# Patient Record
Sex: Female | Born: 1950
Health system: Southern US, Community
[De-identification: ages and names within clinical notes are randomized; demographics above are authoritative.]

## PROBLEM LIST (undated history)

## (undated) DIAGNOSIS — R251 Tremor, unspecified: Secondary | ICD-10-CM

## (undated) DIAGNOSIS — R519 Headache, unspecified: Secondary | ICD-10-CM

## (undated) DIAGNOSIS — R569 Unspecified convulsions: Secondary | ICD-10-CM

## (undated) DIAGNOSIS — R51 Headache: Secondary | ICD-10-CM

## (undated) DIAGNOSIS — K219 Gastro-esophageal reflux disease without esophagitis: Secondary | ICD-10-CM

## (undated) DIAGNOSIS — E079 Disorder of thyroid, unspecified: Secondary | ICD-10-CM

## (undated) DIAGNOSIS — F209 Schizophrenia, unspecified: Secondary | ICD-10-CM

## (undated) DIAGNOSIS — K439 Ventral hernia without obstruction or gangrene: Secondary | ICD-10-CM

## (undated) DIAGNOSIS — R269 Unspecified abnormalities of gait and mobility: Secondary | ICD-10-CM

## (undated) DIAGNOSIS — E119 Type 2 diabetes mellitus without complications: Secondary | ICD-10-CM

## (undated) DIAGNOSIS — F79 Unspecified intellectual disabilities: Secondary | ICD-10-CM

## (undated) DIAGNOSIS — Z8719 Personal history of other diseases of the digestive system: Secondary | ICD-10-CM

## (undated) DIAGNOSIS — Z8639 Personal history of other endocrine, nutritional and metabolic disease: Secondary | ICD-10-CM

## (undated) DIAGNOSIS — I10 Essential (primary) hypertension: Secondary | ICD-10-CM

## (undated) DIAGNOSIS — R600 Localized edema: Secondary | ICD-10-CM

## (undated) DIAGNOSIS — E039 Hypothyroidism, unspecified: Secondary | ICD-10-CM

## (undated) HISTORY — DX: Unspecified abnormalities of gait and mobility: R26.9

## (undated) HISTORY — DX: Personal history of other diseases of the digestive system: Z87.19

## (undated) HISTORY — DX: Ventral hernia without obstruction or gangrene: K43.9

## (undated) HISTORY — PX: STOMACH SURGERY: SHX791

## (undated) HISTORY — PX: OTHER SURGICAL HISTORY: SHX169

## (undated) HISTORY — DX: Tremor, unspecified: R25.1

## (undated) HISTORY — DX: Headache: R51

## (undated) HISTORY — DX: Headache, unspecified: R51.9

## (undated) HISTORY — DX: Type 2 diabetes mellitus without complications: E11.9

## (undated) HISTORY — DX: Unspecified convulsions: R56.9

## (undated) HISTORY — PX: CATARACT EXTRACTION, BILATERAL: SHX1313

## (undated) HISTORY — DX: Localized edema: R60.0

---
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
## 1999-08-03 DIAGNOSIS — G40401 Other generalized epilepsy and epileptic syndromes, not intractable, with status epilepticus: Secondary | ICD-10-CM | POA: Insufficient documentation

## 2006-05-22 DIAGNOSIS — R059 Cough, unspecified: Secondary | ICD-10-CM | POA: Insufficient documentation

## 2007-08-18 ENCOUNTER — Encounter: Admission: RE | Admit: 2007-08-18 | Discharge: 2007-08-18 | Payer: Self-pay | Admitting: Family Medicine

## 2008-08-23 ENCOUNTER — Encounter: Admission: RE | Admit: 2008-08-23 | Discharge: 2008-08-23 | Payer: Self-pay | Admitting: Family Medicine

## 2009-08-24 ENCOUNTER — Encounter: Admission: RE | Admit: 2009-08-24 | Discharge: 2009-08-24 | Payer: Self-pay | Admitting: Family Medicine

## 2010-04-29 ENCOUNTER — Encounter: Payer: Self-pay | Admitting: Family Medicine

## 2010-08-14 ENCOUNTER — Other Ambulatory Visit: Payer: Self-pay | Admitting: Family Medicine

## 2010-08-14 DIAGNOSIS — Z1231 Encounter for screening mammogram for malignant neoplasm of breast: Secondary | ICD-10-CM

## 2010-08-29 ENCOUNTER — Ambulatory Visit
Admission: RE | Admit: 2010-08-29 | Discharge: 2010-08-29 | Disposition: A | Discharge status: Home / Self Care | Payer: Medicare Other | Source: Ambulatory Visit | Attending: Family Medicine | Admitting: Family Medicine

## 2010-08-29 DIAGNOSIS — Z1231 Encounter for screening mammogram for malignant neoplasm of breast: Secondary | ICD-10-CM

## 2010-09-15 DIAGNOSIS — K439 Ventral hernia without obstruction or gangrene: Secondary | ICD-10-CM | POA: Insufficient documentation

## 2011-09-02 ENCOUNTER — Other Ambulatory Visit: Payer: Self-pay | Admitting: Family Medicine

## 2011-09-02 DIAGNOSIS — Z1231 Encounter for screening mammogram for malignant neoplasm of breast: Secondary | ICD-10-CM

## 2011-09-22 ENCOUNTER — Ambulatory Visit: Payer: Medicare Other | Admitting: Physical Therapy

## 2011-09-23 ENCOUNTER — Ambulatory Visit
Admission: RE | Admit: 2011-09-23 | Discharge: 2011-09-23 | Disposition: A | Discharge status: Home / Self Care | Payer: Medicare HMO | Source: Ambulatory Visit | Attending: Family Medicine | Admitting: Family Medicine

## 2011-09-23 DIAGNOSIS — Z1231 Encounter for screening mammogram for malignant neoplasm of breast: Secondary | ICD-10-CM

## 2011-12-23 ENCOUNTER — Ambulatory Visit (HOSPITAL_COMMUNITY)
Admission: RE | Admit: 2011-12-23 | Discharge: 2011-12-23 | Disposition: A | Discharge status: Home / Self Care | Payer: Medicare HMO | Source: Ambulatory Visit | Attending: Adult Health Nurse Practitioner | Admitting: Adult Health Nurse Practitioner

## 2011-12-23 ENCOUNTER — Other Ambulatory Visit (HOSPITAL_COMMUNITY): Payer: Self-pay | Admitting: Adult Health Nurse Practitioner

## 2011-12-23 DIAGNOSIS — M25569 Pain in unspecified knee: Secondary | ICD-10-CM | POA: Insufficient documentation

## 2011-12-23 DIAGNOSIS — M25562 Pain in left knee: Secondary | ICD-10-CM

## 2011-12-23 DIAGNOSIS — M899 Disorder of bone, unspecified: Secondary | ICD-10-CM | POA: Insufficient documentation

## 2012-08-29 ENCOUNTER — Emergency Department (HOSPITAL_COMMUNITY)
Admission: EM | Admit: 2012-08-29 | Discharge: 2012-08-29 | Disposition: A | Discharge status: Home / Self Care | Payer: Medicare HMO | Attending: Emergency Medicine | Admitting: Emergency Medicine

## 2012-08-29 ENCOUNTER — Encounter (HOSPITAL_COMMUNITY): Payer: Self-pay | Admitting: Emergency Medicine

## 2012-08-29 ENCOUNTER — Emergency Department (HOSPITAL_COMMUNITY): Payer: Medicare HMO

## 2012-08-29 DIAGNOSIS — W19XXXA Unspecified fall, initial encounter: Secondary | ICD-10-CM

## 2012-08-29 DIAGNOSIS — I10 Essential (primary) hypertension: Secondary | ICD-10-CM | POA: Insufficient documentation

## 2012-08-29 DIAGNOSIS — IMO0002 Reserved for concepts with insufficient information to code with codable children: Secondary | ICD-10-CM | POA: Insufficient documentation

## 2012-08-29 DIAGNOSIS — Y9389 Activity, other specified: Secondary | ICD-10-CM | POA: Insufficient documentation

## 2012-08-29 DIAGNOSIS — E871 Hypo-osmolality and hyponatremia: Secondary | ICD-10-CM | POA: Insufficient documentation

## 2012-08-29 DIAGNOSIS — Z8639 Personal history of other endocrine, nutritional and metabolic disease: Secondary | ICD-10-CM | POA: Insufficient documentation

## 2012-08-29 DIAGNOSIS — Z8719 Personal history of other diseases of the digestive system: Secondary | ICD-10-CM | POA: Insufficient documentation

## 2012-08-29 DIAGNOSIS — E875 Hyperkalemia: Secondary | ICD-10-CM | POA: Insufficient documentation

## 2012-08-29 DIAGNOSIS — E876 Hypokalemia: Secondary | ICD-10-CM

## 2012-08-29 DIAGNOSIS — Z8669 Personal history of other diseases of the nervous system and sense organs: Secondary | ICD-10-CM | POA: Insufficient documentation

## 2012-08-29 DIAGNOSIS — Y92009 Unspecified place in unspecified non-institutional (private) residence as the place of occurrence of the external cause: Secondary | ICD-10-CM | POA: Insufficient documentation

## 2012-08-29 DIAGNOSIS — Z862 Personal history of diseases of the blood and blood-forming organs and certain disorders involving the immune mechanism: Secondary | ICD-10-CM | POA: Insufficient documentation

## 2012-08-29 DIAGNOSIS — R296 Repeated falls: Secondary | ICD-10-CM | POA: Insufficient documentation

## 2012-08-29 HISTORY — DX: Disorder of thyroid, unspecified: E07.9

## 2012-08-29 HISTORY — DX: Essential (primary) hypertension: I10

## 2012-08-29 HISTORY — DX: Gastro-esophageal reflux disease without esophagitis: K21.9

## 2012-08-29 HISTORY — DX: Unspecified convulsions: R56.9

## 2012-08-29 LAB — BASIC METABOLIC PANEL
Creatinine, Ser: 1.06 mg/dL (ref 0.50–1.10)
GFR calc Af Amer: 64 mL/min — ABNORMAL LOW (ref >90)
GFR calc non Af Amer: 55 mL/min — ABNORMAL LOW (ref >90)
Potassium: 3.2 mEq/L — ABNORMAL LOW (ref 3.5–5.1)

## 2012-08-29 LAB — CBC WITH DIFFERENTIAL/PLATELET
Basophils Relative: 1 % (ref 0–1)
Eosinophils Absolute: 0 10*3/uL (ref 0.0–0.7)
HCT: 36.2 % (ref 36.0–46.0)
MCH: 33.7 pg (ref 26.0–34.0)
MCV: 91.6 fL (ref 78.0–100.0)
Monocytes Absolute: 0.7 10*3/uL (ref 0.1–1.0)
Monocytes Relative: 19 % — ABNORMAL HIGH (ref 3–12)
Neutro Abs: 1.7 10*3/uL (ref 1.7–7.7)
Neutrophils Relative %: 51 % (ref 43–77)
RBC: 3.95 MIL/uL (ref 3.87–5.11)
WBC: 3.4 10*3/uL — ABNORMAL LOW (ref 4.0–10.5)

## 2012-08-29 MED ORDER — POTASSIUM CHLORIDE CRYS ER 20 MEQ PO TBCR
40.0000 meq | EXTENDED_RELEASE_TABLET | Freq: Once | ORAL | Status: AC
  Administered 2012-08-29: 40 meq via ORAL
  Filled 2012-08-29: qty 2

## 2012-08-29 MED ORDER — POTASSIUM CHLORIDE CRYS ER 20 MEQ PO TBCR: 20.0000 meq | EXTENDED_RELEASE_TABLET | Freq: Two times a day (BID) | ORAL | Status: DC

## 2012-08-29 NOTE — ED Notes (Signed)
Pt was found out in yard by neighbor due to fall, when asked what caused pt to fall, pt states " I just fell" at times pt will state that her lower legs hurt, next time when asked pt denies any pain, has hx of MR, has mental capacity of 62 year old child. Pt arrived to er fully immobilized, cms intact all extremities, lung sound clear bilateral with equal expansion of chest area, abd soft and non tender, pelvis stable, pt denies any pain with palpation of upper or lower extremities.

## 2012-08-29 NOTE — ED Notes (Signed)
c-collar removed,

## 2012-08-29 NOTE — ED Notes (Signed)
Pt removed from [place] by Dr. Roxanne Mins, c-collar remains in place, pt has abrasion to outer aspect of left knee, no bleeding noted, abrasion cleaned with NS, pt tolerated well.

## 2012-08-29 NOTE — ED Notes (Addendum)
Lab in to draw blood work at present time, family at bedside, updated on plan of care

## 2012-08-29 NOTE — ED Notes (Signed)
Neighbor found pt on ground this am. Pt c/o lower back pain. Pt fully immobilized. cbg 101. Alert/oriented to most. Pt has MR, mentality of 62 year old per what was told to EMS. Slightly anxious.

## 2012-08-29 NOTE — ED Notes (Signed)
EMS reported concerns that pt had not been taking her medication as scheduled, pt has duplicate bottles from April and may with both bottles half full. Pt states that she takes her medications herself.

## 2012-08-29 NOTE — ED Provider Notes (Signed)
History    This chart was scribed for Delora Fuel, MD by Ludger Nutting, ED Scribe. This patient was seen in room APA16A/APA16A and the patient's care was started 9:20 AM.   CSN: [id number]  Arrival date & time 08/29/12  0903   First MD Initiated Contact with Patient 08/29/12 [phone]      Chief Complaint  Patient presents with  . Fall     The history is provided by the patient. The history is limited by the condition of the patient. No language interpreter was used.   Level 5 Caveat HPI Comments: Pam Sanders is a 62 y.o. female brought in by ambulance, who presents to the Emergency Department complaining of a fall that occurred earlier this morning. Pt was found out in the yard by neighbor and when she was asked what caused her to fall, pt states "I just fell". Pt initially reported having back pain but did not mention this Pt lives alone and walks with a cane. EMS was told that pt has MR and mentality of a 62 year old. Pt has h/o HTN, seizures, thyroid disease.   Past Medical History  Diagnosis Date  . Hypertension   . Thyroid disease   . Seizures   . GERD (gastroesophageal reflux disease)     History reviewed. No pertinent past surgical history.  History reviewed. No pertinent family history.  History  Substance Use Topics  . Smoking status: Not on file  . Smokeless tobacco: Not on file  . Alcohol Use: No    OB History   Grav Para Term Preterm Abortions TAB SAB Ect Mult Living                  Review of Systems  Unable to perform ROS: Other  All other systems reviewed and are negative.    Allergies  Review of patient's allergies indicates no known allergies.  Home Medications  No current outpatient prescriptions on file.  BP 124/68  Pulse 69  Temp(Src) 98 F (36.7 C) (Oral)  Resp 19  SpO2 100%  Physical Exam  Nursing note and vitals reviewed. Constitutional: She is oriented to person, place, and time. She appears well-developed and well-nourished.  No distress.  LSB and stiff C collar in place.   HENT:  Head: Normocephalic and atraumatic.  Eyes: EOM are normal.  Neck: Neck supple. No tracheal deviation present.  Cardiovascular: Normal rate, regular rhythm and normal heart sounds.   Pulmonary/Chest: Effort normal and breath sounds normal. No respiratory distress. She has no wheezes. She has no rales. She exhibits no tenderness.  Musculoskeletal: Normal range of motion.  Minor abrasion to lateral aspect of L knee. Inconsistent complaints of pain with palpation and movement of both legs.  Neurological: She is alert and oriented to person, place, and time.  Oriented to person not place or time.   Skin: Skin is warm and dry.  Psychiatric: She has a normal mood and affect. Her behavior is normal.    ED Course  Procedures (including critical care time)  DIAGNOSTIC STUDIES: Oxygen Saturation is 100% on room air, normal by my interpretation.    COORDINATION OF CARE: 9:35 AM LSB was removed by MD.  9:47 AM Discussed treatment plan with pt at bedside and pt agreed to plan.   Results for orders placed during the hospital encounter of 08/29/12  CBC WITH DIFFERENTIAL      Result Value Range   WBC 3.4 (*) 4.0 - 10.5 K/uL   RBC  3.95  3.87 - 5.11 MIL/uL   Hemoglobin 13.3  12.0 - 15.0 g/dL   HCT 36.2  36.0 - 46.0 %   MCV 91.6  78.0 - 100.0 fL   MCH 33.7  26.0 - 34.0 pg   MCHC 36.7 (*) 30.0 - 36.0 g/dL   RDW 12.6  11.5 - 15.5 %   Platelets 154  150 - 400 K/uL   Neutrophils Relative % 51  43 - 77 %   Neutro Abs 1.7  1.7 - 7.7 K/uL   Lymphocytes Relative 29  12 - 46 %   Lymphs Abs 1.0  0.7 - 4.0 K/uL   Monocytes Relative 19 (*) 3 - 12 %   Monocytes Absolute 0.7  0.1 - 1.0 K/uL   Eosinophils Relative 1  0 - 5 %   Eosinophils Absolute 0.0  0.0 - 0.7 K/uL   Basophils Relative 1  0 - 1 %   Basophils Absolute 0.0  0.0 - 0.1 K/uL  BASIC METABOLIC PANEL      Result Value Range   Sodium 129 (*) 135 - 145 mEq/L   Potassium 3.2 (*) 3.5 -  5.1 mEq/L   Chloride 91 (*) 96 - 112 mEq/L   CO2 30  19 - 32 mEq/L   Glucose, Bld 106 (*) 70 - 99 mg/dL   BUN 16  6 - 23 mg/dL   Creatinine, Ser 1.06  0.50 - 1.10 mg/dL   Calcium 9.4  8.4 - 10.5 mg/dL   GFR calc non Af Amer 55 (*) >90 mL/min   GFR calc Af Amer 64 (*) >90 mL/min  VALPROIC ACID LEVEL      Result Value Range   Valproic Acid Lvl 56.1  50.0 - 100.0 ug/mL  CK      Result Value Range   Total CK 163  7 - 177 U/L   Dg Pelvis 1-2 Views  08/29/2012   *RADIOLOGY REPORT*  Clinical Data: Fall  PELVIS - 1-2 VIEW  Comparison: None.  Findings: No acute fracture and no dislocation.  Phleboliths project over the left hemi pelvis.  IMPRESSION: No acute bony injury.   Original Report Authenticated By: Marybelle Killings, M.D.   Ct Head Wo Contrast  08/29/2012   *RADIOLOGY REPORT*  Clinical Data:  Fall  CT HEAD WITHOUT CONTRAST CT CERVICAL SPINE WITHOUT CONTRAST  Technique:  Multidetector CT imaging of the head and cervical spine was performed following the standard protocol without intravenous contrast.  Multiplanar CT image reconstructions of the cervical spine were also generated.  Comparison:   None  CT HEAD  Findings: There is marked encephalomalacia in the left occipital lobe with ex vacuo dilatation of the occipital horn of the left lateral ventricle.  Dural calcifications in this vicinity are noted.  There is shift of the tentorium to the left which has a chronic appearance.  The findings are not significantly changed compared with a prior sinus CT dated 11/03/2007.  Minimal chronic ischemic changes in the periventricular white matter.  Global atrophy.  No mass effect or acute intracranial hemorrhage.  Mastoid air cells are clear.  Visualized paranasal sinuses are clear.  Cranium is intact.  IMPRESSION: No acute intracranial pathology.  Chronic changes are noted.  CT CERVICAL SPINE  Findings: No acute fracture.  No dislocation.  Anatomic alignment. Degenerative changes are noted.  Severe narrowing of  the C5-6 and C6-7 disc with posterior osteophytic ridging.  Right-sided facet arthropathy at C4-5 and C7- T1 on the right.  No evidence of spinal hemorrhage.  Thyroid is atrophic.  No obvious soft tissue injury.  IMPRESSION: No evidence of injury in the cervical spine.   Original Report Authenticated By: Marybelle Killings, M.D.   Ct Cervical Spine Wo Contrast  08/29/2012   *RADIOLOGY REPORT*  Clinical Data:  Fall  CT HEAD WITHOUT CONTRAST CT CERVICAL SPINE WITHOUT CONTRAST  Technique:  Multidetector CT imaging of the head and cervical spine was performed following the standard protocol without intravenous contrast.  Multiplanar CT image reconstructions of the cervical spine were also generated.  Comparison:   None  CT HEAD  Findings: There is marked encephalomalacia in the left occipital lobe with ex vacuo dilatation of the occipital horn of the left lateral ventricle.  Dural calcifications in this vicinity are noted.  There is shift of the tentorium to the left which has a chronic appearance.  The findings are not significantly changed compared with a prior sinus CT dated 11/03/2007.  Minimal chronic ischemic changes in the periventricular white matter.  Global atrophy.  No mass effect or acute intracranial hemorrhage.  Mastoid air cells are clear.  Visualized paranasal sinuses are clear.  Cranium is intact.  IMPRESSION: No acute intracranial pathology.  Chronic changes are noted.  CT CERVICAL SPINE  Findings: No acute fracture.  No dislocation.  Anatomic alignment. Degenerative changes are noted.  Severe narrowing of the C5-6 and C6-7 disc with posterior osteophytic ridging.  Right-sided facet arthropathy at C4-5 and C7- T1 on the right.  No evidence of spinal hemorrhage.  Thyroid is atrophic.  No obvious soft tissue injury.  IMPRESSION: No evidence of injury in the cervical spine.   Original Report Authenticated By: Marybelle Killings, M.D.      1. Fall at home, initial encounter   2. Hypokalemia   3. Hyponatremia        MDM  Fall with no obvious injury. However, with complaints of eye pain, pelvis x-ray will be obtained. She shows no clinical evidence of significant leg injury. Also, CT will be obtained of head and cervical spine. Then scissors a question of whether she has been taking her valproic acid appropriately, level will be checked.   X-rays and CT scans are unremarkable. Valproic acid level is therapeutic. Incidentally noted is hyponatremia and hypokalemia which are probably not clinically significant. She is given a prescription for K-Dur and is to followup with her PCP.     I personally performed the services described in this documentation, which was scribed in my presence. The recorded information has been reviewed and is accurate.     Delora Fuel, MD [id number]

## 2012-08-29 NOTE — ED Notes (Signed)
Family member with pt states that when pt takes a medicine twice a day she will divide the tablets between two bottles and that is why pt has multiple bottles of same medication,

## 2012-10-12 ENCOUNTER — Other Ambulatory Visit: Payer: Self-pay

## 2012-10-12 DIAGNOSIS — Z1231 Encounter for screening mammogram for malignant neoplasm of breast: Secondary | ICD-10-CM

## 2012-11-08 ENCOUNTER — Ambulatory Visit
Admission: RE | Admit: 2012-11-08 | Discharge: 2012-11-08 | Disposition: A | Discharge status: Home / Self Care | Payer: Medicare HMO | Source: Ambulatory Visit

## 2012-11-08 DIAGNOSIS — Z1231 Encounter for screening mammogram for malignant neoplasm of breast: Secondary | ICD-10-CM

## 2012-11-10 ENCOUNTER — Other Ambulatory Visit: Payer: Self-pay | Admitting: Family Medicine

## 2012-11-10 DIAGNOSIS — R928 Other abnormal and inconclusive findings on diagnostic imaging of breast: Secondary | ICD-10-CM

## 2012-11-24 ENCOUNTER — Ambulatory Visit
Admission: RE | Admit: 2012-11-24 | Discharge: 2012-11-24 | Disposition: A | Discharge status: Home / Self Care | Payer: Medicare HMO | Source: Ambulatory Visit | Attending: Family Medicine | Admitting: Family Medicine

## 2012-11-24 DIAGNOSIS — R928 Other abnormal and inconclusive findings on diagnostic imaging of breast: Secondary | ICD-10-CM

## 2013-01-04 ENCOUNTER — Encounter: Payer: Self-pay | Admitting: Neurology

## 2013-01-04 DIAGNOSIS — R269 Unspecified abnormalities of gait and mobility: Secondary | ICD-10-CM

## 2013-01-04 DIAGNOSIS — E119 Type 2 diabetes mellitus without complications: Secondary | ICD-10-CM | POA: Insufficient documentation

## 2013-01-05 ENCOUNTER — Encounter: Payer: Self-pay | Admitting: Neurology

## 2013-01-05 ENCOUNTER — Ambulatory Visit (INDEPENDENT_AMBULATORY_CARE_PROVIDER_SITE_OTHER): Payer: Medicare HMO | Admitting: Neurology

## 2013-01-05 VITALS — BP 124/75 | HR 60 | Ht 62.0 in | Wt 194.0 lb

## 2013-01-05 DIAGNOSIS — G40909 Epilepsy, unspecified, not intractable, without status epilepticus: Secondary | ICD-10-CM | POA: Insufficient documentation

## 2013-01-05 DIAGNOSIS — R569 Unspecified convulsions: Secondary | ICD-10-CM

## 2013-01-05 MED ORDER — TOPIRAMATE 100 MG PO TABS: 100.0000 mg | ORAL_TABLET | Freq: Two times a day (BID) | ORAL | Status: DC

## 2013-01-05 MED ORDER — DIVALPROEX SODIUM ER 500 MG PO TB24: 500.0000 mg | ORAL_TABLET | Freq: Two times a day (BID) | ORAL | Status: DC

## 2013-01-05 NOTE — Progress Notes (Signed)
History of Present Illness:   Pam Sanders is a 62 years old left-handed Caucasian female, accompanied by her sister at today's clinical visit.   She has past medical history of hard of hearing, mental retardation, epilepsy, came in for evaluation of bilateral hands shaking, continued gait unsteadiness, she is referred by her primary care physician.  She was previously evaluated by my clinic Dr. Estella Husk  2008, MRI of the brain then has demonstrated left parietal occipital area encephalomalacia, and focal dilatation of the posterior horn, and trigone in lateral ventricle, deviation of the midline, laboratory evaluation in November 2011 has demonstrated mild elevated glucose 100, otherwise normal CMP, mild decreased WBC 3.9 otherwise normal CBC, TSH, phenobarbital level 23.7, VPA 61.3  Patient had a history of epilepsy since the 62 years old, was under the care of her parents, but has moved out and had  her own apartment since she was 20th, she graduated from high school, but has mental retardation only was able to work simple job, she is now moving close to her sister who accompanied her at today's clinical visit, still lives alone at at her own apartment, managing her home medications, but she never drive, last seizure was about 2 years ago, in a stressful situation.  She always has mild staggering gait, has been on phenobarbital treatment for an extended period of time, current dosage is 16.2mg , 97.2mg  at nighttime, Topamax 100 b.i.d., VPA XR 500 mg every day. Her sister also noticed 6 months history of left hand tremor, no weakness, no incontinence, no loss central smell, good nighttime sleep  We were able to  taper her off phenobarbital,she is overall doing very well, there was no recurrent seizure,   She is currently taking Topamax 100 mg twice a day, Depakote XR 500 mg every night, her sister visited her every other day. She lives in her own apartment     Review of Systems  Out of a complete 14 system  review, the patient complains of only the following symptoms, and all other reviewed systems are negative.  Constitutional: Weight gain   Cardiovascular: Swelling in legs   Sleep:  excessive  sleepiness   Social History  pt lives at home by herself and has a 12th grade educaton. pt has no childern and does not work. pt denies alcohol, illegal drugs, and tobacco use. pt drinks 1 soda daily. She tried to kill herself by stabbing herself in the stomach Inhaled Tobacco Use: never smoker  Family History  mother and father deceased. mother past at age 2 due to a stroke, father deceased at age 74 due to a stroke. diabetes- father side heart problems- mother side  Past Medical History  diabetes  Surgical History  pt had to have sx because she stabbed herself in the stomach    Physical Exam  Neck: supple no carotid bruits Respiratory: clear to auscultation bilaterally Cardiovascular: regular rate rhythm  Neurologic Exam  Mental Status: pleasant,  quiet,awake, alert, cooperative, hard of hearing, could not provide meaniful history Cranial Nerves: CN II-XII pupils were equal round reactive to light. Extraocular movements were full.  Visual fields were full on confrontational test.  Facial sensation and strength were normal.  Hearing was intact to finger rubbing bilaterally.  Uvula tongue were midline.  Head turning and shoulder shrugging were normal and symmetric.  Tongue protrusion into the cheeks strength were normal.  Motor: Normal tone, bulk, and strength, with exception of slight right hand rigidity. Sensory: Normal to light touch Coordination: Normal  finger-to-nose, heel-to-shin.  There was no dysmetria noticed. Gait and Station: impulsive movement, wide based, mild unsteady. Reflexes: Deep tendon reflexes: hypoactive and symmetric.  Plantar responses are flexor.   Assessment and Plan:   62 years old left-handed Caucasian female, with past medical history of epilepsy, on polypharmacy,  including long-standing history of phenobarbital, now presenting with worsening gait difficulty, left hand tremor. There was no rigidity, bradykinesia to suggested parkinsonian features, She is now off Phenobarbital, overall, doing very well, no recurrent seizure.  1. She is to continue Topamax 100mg  bid, and Depakote xr 500mg  qhs for epilepsy control 2.   RTC in 12 months with Hoyle Sauer

## 2013-03-23 ENCOUNTER — Ambulatory Visit: Payer: Medicare HMO | Admitting: Cardiology

## 2013-04-01 ENCOUNTER — Encounter: Payer: Self-pay | Admitting: Cardiology

## 2013-06-22 ENCOUNTER — Other Ambulatory Visit: Payer: Self-pay | Admitting: Family Medicine

## 2013-06-22 ENCOUNTER — Other Ambulatory Visit: Payer: Self-pay

## 2013-06-22 DIAGNOSIS — R921 Mammographic calcification found on diagnostic imaging of breast: Secondary | ICD-10-CM

## 2013-09-22 DIAGNOSIS — I739 Peripheral vascular disease, unspecified: Secondary | ICD-10-CM | POA: Insufficient documentation

## 2013-11-10 ENCOUNTER — Ambulatory Visit
Admission: RE | Admit: 2013-11-10 | Discharge: 2013-11-10 | Disposition: A | Discharge status: Home / Self Care | Payer: Medicare HMO | Source: Ambulatory Visit | Attending: Family Medicine | Admitting: Family Medicine

## 2013-11-10 ENCOUNTER — Encounter (INDEPENDENT_AMBULATORY_CARE_PROVIDER_SITE_OTHER): Payer: Self-pay

## 2013-11-10 DIAGNOSIS — R921 Mammographic calcification found on diagnostic imaging of breast: Secondary | ICD-10-CM

## 2014-01-05 ENCOUNTER — Ambulatory Visit: Payer: Self-pay | Admitting: Nurse Practitioner

## 2014-02-04 ENCOUNTER — Other Ambulatory Visit: Payer: Self-pay | Admitting: Neurology

## 2014-03-06 DIAGNOSIS — E039 Hypothyroidism, unspecified: Secondary | ICD-10-CM | POA: Insufficient documentation

## 2014-03-06 DIAGNOSIS — R609 Edema, unspecified: Secondary | ICD-10-CM | POA: Insufficient documentation

## 2014-03-06 DIAGNOSIS — E669 Obesity, unspecified: Secondary | ICD-10-CM | POA: Insufficient documentation

## 2014-09-05 DIAGNOSIS — F259 Schizoaffective disorder, unspecified: Secondary | ICD-10-CM | POA: Insufficient documentation

## 2014-09-05 DIAGNOSIS — K219 Gastro-esophageal reflux disease without esophagitis: Secondary | ICD-10-CM | POA: Insufficient documentation

## 2014-10-06 ENCOUNTER — Other Ambulatory Visit: Payer: Self-pay | Admitting: Adult Health Nurse Practitioner

## 2014-10-06 DIAGNOSIS — E2839 Other primary ovarian failure: Secondary | ICD-10-CM

## 2014-10-10 ENCOUNTER — Other Ambulatory Visit: Payer: Medicare HMO

## 2014-10-10 ENCOUNTER — Ambulatory Visit
Admission: RE | Admit: 2014-10-10 | Discharge: 2014-10-10 | Disposition: A | Discharge status: Home / Self Care | Payer: Medicare Other | Source: Ambulatory Visit | Attending: Adult Health Nurse Practitioner | Admitting: Adult Health Nurse Practitioner

## 2014-10-10 DIAGNOSIS — E2839 Other primary ovarian failure: Secondary | ICD-10-CM

## 2014-10-16 ENCOUNTER — Other Ambulatory Visit: Payer: Self-pay | Admitting: Adult Health Nurse Practitioner

## 2014-10-16 DIAGNOSIS — R921 Mammographic calcification found on diagnostic imaging of breast: Secondary | ICD-10-CM

## 2014-10-19 ENCOUNTER — Ambulatory Visit (INDEPENDENT_AMBULATORY_CARE_PROVIDER_SITE_OTHER): Payer: 59 | Admitting: Psychology

## 2014-10-19 DIAGNOSIS — F419 Anxiety disorder, unspecified: Secondary | ICD-10-CM | POA: Diagnosis not present

## 2014-10-19 DIAGNOSIS — F259 Schizoaffective disorder, unspecified: Secondary | ICD-10-CM | POA: Diagnosis not present

## 2014-10-23 ENCOUNTER — Ambulatory Visit: Payer: Medicare HMO | Admitting: Psychology

## 2014-11-13 ENCOUNTER — Ambulatory Visit
Admission: RE | Admit: 2014-11-13 | Discharge: 2014-11-13 | Disposition: A | Discharge status: Home / Self Care | Payer: Medicare Other | Source: Ambulatory Visit | Attending: Adult Health Nurse Practitioner | Admitting: Adult Health Nurse Practitioner

## 2014-11-13 DIAGNOSIS — R921 Mammographic calcification found on diagnostic imaging of breast: Secondary | ICD-10-CM

## 2015-02-08 ENCOUNTER — Ambulatory Visit (INDEPENDENT_AMBULATORY_CARE_PROVIDER_SITE_OTHER): Payer: Medicare Other | Admitting: Otolaryngology

## 2015-02-08 DIAGNOSIS — H903 Sensorineural hearing loss, bilateral: Secondary | ICD-10-CM | POA: Diagnosis not present

## 2015-03-29 DIAGNOSIS — E785 Hyperlipidemia, unspecified: Secondary | ICD-10-CM | POA: Insufficient documentation

## 2015-08-14 ENCOUNTER — Other Ambulatory Visit (HOSPITAL_COMMUNITY): Payer: Self-pay | Admitting: Family Medicine

## 2015-08-14 DIAGNOSIS — R296 Repeated falls: Secondary | ICD-10-CM | POA: Insufficient documentation

## 2015-08-14 DIAGNOSIS — R2681 Unsteadiness on feet: Secondary | ICD-10-CM | POA: Insufficient documentation

## 2015-08-14 DIAGNOSIS — D539 Nutritional anemia, unspecified: Secondary | ICD-10-CM | POA: Insufficient documentation

## 2015-08-14 DIAGNOSIS — G9389 Other specified disorders of brain: Secondary | ICD-10-CM | POA: Insufficient documentation

## 2015-08-14 DIAGNOSIS — N183 Chronic kidney disease, stage 3 unspecified: Secondary | ICD-10-CM | POA: Insufficient documentation

## 2015-08-15 ENCOUNTER — Ambulatory Visit (HOSPITAL_COMMUNITY): Payer: Medicare Other

## 2016-04-05 ENCOUNTER — Inpatient Hospital Stay (HOSPITAL_COMMUNITY)
Admission: EM | Admit: 2016-04-05 | Discharge: 2016-04-07 | DRG: 684 | Disposition: A | Discharge status: Skilled Nursing Facility | Payer: Medicare Other | Attending: Internal Medicine | Admitting: Internal Medicine

## 2016-04-05 ENCOUNTER — Emergency Department (HOSPITAL_COMMUNITY): Payer: Medicare Other

## 2016-04-05 ENCOUNTER — Encounter (HOSPITAL_COMMUNITY): Payer: Self-pay | Admitting: Emergency Medicine

## 2016-04-05 DIAGNOSIS — R269 Unspecified abnormalities of gait and mobility: Secondary | ICD-10-CM | POA: Diagnosis present

## 2016-04-05 DIAGNOSIS — E860 Dehydration: Secondary | ICD-10-CM | POA: Diagnosis not present

## 2016-04-05 DIAGNOSIS — R2681 Unsteadiness on feet: Secondary | ICD-10-CM

## 2016-04-05 DIAGNOSIS — K219 Gastro-esophageal reflux disease without esophagitis: Secondary | ICD-10-CM | POA: Diagnosis present

## 2016-04-05 DIAGNOSIS — E876 Hypokalemia: Secondary | ICD-10-CM | POA: Diagnosis not present

## 2016-04-05 DIAGNOSIS — G40909 Epilepsy, unspecified, not intractable, without status epilepticus: Secondary | ICD-10-CM | POA: Diagnosis present

## 2016-04-05 DIAGNOSIS — Z833 Family history of diabetes mellitus: Secondary | ICD-10-CM

## 2016-04-05 DIAGNOSIS — M6281 Muscle weakness (generalized): Secondary | ICD-10-CM

## 2016-04-05 DIAGNOSIS — W19XXXA Unspecified fall, initial encounter: Secondary | ICD-10-CM | POA: Diagnosis present

## 2016-04-05 DIAGNOSIS — Z823 Family history of stroke: Secondary | ICD-10-CM

## 2016-04-05 DIAGNOSIS — I10 Essential (primary) hypertension: Secondary | ICD-10-CM

## 2016-04-05 DIAGNOSIS — R4189 Other symptoms and signs involving cognitive functions and awareness: Secondary | ICD-10-CM | POA: Diagnosis present

## 2016-04-05 DIAGNOSIS — N179 Acute kidney failure, unspecified: Secondary | ICD-10-CM | POA: Diagnosis not present

## 2016-04-05 DIAGNOSIS — R569 Unspecified convulsions: Secondary | ICD-10-CM

## 2016-04-05 DIAGNOSIS — I951 Orthostatic hypotension: Secondary | ICD-10-CM | POA: Diagnosis present

## 2016-04-05 DIAGNOSIS — E119 Type 2 diabetes mellitus without complications: Secondary | ICD-10-CM | POA: Diagnosis present

## 2016-04-05 DIAGNOSIS — R296 Repeated falls: Secondary | ICD-10-CM | POA: Diagnosis present

## 2016-04-05 DIAGNOSIS — Z79899 Other long term (current) drug therapy: Secondary | ICD-10-CM

## 2016-04-05 DIAGNOSIS — I959 Hypotension, unspecified: Secondary | ICD-10-CM

## 2016-04-05 LAB — COMPREHENSIVE METABOLIC PANEL
ALBUMIN: 3.7 g/dL (ref 3.5–5.0)
ALT: 10 U/L — AB (ref 14–54)
ANION GAP: 10 (ref 5–15)
AST: 21 U/L (ref 15–41)
Alkaline Phosphatase: 41 U/L (ref 38–126)
BUN: 51 mg/dL — ABNORMAL HIGH (ref 6–20)
CHLORIDE: 100 mmol/L — AB (ref 101–111)
CO2: 29 mmol/L (ref 22–32)
CREATININE: 2.58 mg/dL — AB (ref 0.44–1.00)
Calcium: 9 mg/dL (ref 8.9–10.3)
GFR calc non Af Amer: 18 mL/min — ABNORMAL LOW (ref >60)
GFR, EST AFRICAN AMERICAN: 21 mL/min — AB (ref >60)
GLUCOSE: 99 mg/dL (ref 65–99)
Potassium: 4.5 mmol/L (ref 3.5–5.1)
SODIUM: 139 mmol/L (ref 135–145)
Total Bilirubin: 0.8 mg/dL (ref 0.3–1.2)
Total Protein: 7.5 g/dL (ref 6.5–8.1)

## 2016-04-05 LAB — URINALYSIS, ROUTINE W REFLEX MICROSCOPIC
BACTERIA UA: NONE SEEN
Bilirubin Urine: NEGATIVE
Glucose, UA: NEGATIVE mg/dL
KETONES UR: NEGATIVE mg/dL
Leukocytes, UA: NEGATIVE
Nitrite: NEGATIVE
PROTEIN: NEGATIVE mg/dL
Specific Gravity, Urine: 1.01 (ref 1.005–1.030)
pH: 5 (ref 5.0–8.0)

## 2016-04-05 LAB — CBC WITH DIFFERENTIAL/PLATELET
Basophils Absolute: 0 10*3/uL (ref 0.0–0.1)
Basophils Relative: 0 %
Eosinophils Absolute: 0.1 10*3/uL (ref 0.0–0.7)
Eosinophils Relative: 1 %
HEMATOCRIT: 34.7 % — AB (ref 36.0–46.0)
HEMOGLOBIN: 11.8 g/dL — AB (ref 12.0–15.0)
LYMPHS ABS: 1.2 10*3/uL (ref 0.7–4.0)
Lymphocytes Relative: 15 %
MCH: 34.5 pg — AB (ref 26.0–34.0)
MCHC: 34 g/dL (ref 30.0–36.0)
MCV: 101.5 fL — ABNORMAL HIGH (ref 78.0–100.0)
MONO ABS: 0.9 10*3/uL (ref 0.1–1.0)
MONOS PCT: 11 %
NEUTROS ABS: 6 10*3/uL (ref 1.7–7.7)
NEUTROS PCT: 73 %
Platelets: 197 10*3/uL (ref 150–400)
RBC: 3.42 MIL/uL — ABNORMAL LOW (ref 3.87–5.11)
RDW: 12.4 % (ref 11.5–15.5)
WBC: 8.1 10*3/uL (ref 4.0–10.5)

## 2016-04-05 LAB — GLUCOSE, CAPILLARY: GLUCOSE-CAPILLARY: 140 mg/dL — AB (ref 65–99)

## 2016-04-05 LAB — VALPROIC ACID LEVEL: VALPROIC ACID LVL: 38 ug/mL — AB (ref 50.0–100.0)

## 2016-04-05 LAB — TROPONIN I: Troponin I: <0.03 ng/mL (ref <0.03)

## 2016-04-05 MED ORDER — ZOLPIDEM TARTRATE 5 MG PO TABS: 5.0000 mg | ORAL_TABLET | Freq: Every evening | ORAL | Status: DC | PRN

## 2016-04-05 MED ORDER — SODIUM CHLORIDE 0.9 % IV SOLN
INTRAVENOUS | Status: DC
  Administered 2016-04-06 – 2016-04-07 (×3): via INTRAVENOUS

## 2016-04-05 MED ORDER — POTASSIUM CHLORIDE CRYS ER 20 MEQ PO TBCR
10.0000 meq | EXTENDED_RELEASE_TABLET | Freq: Every day | ORAL | Status: DC
  Administered 2016-04-05 – 2016-04-07 (×4): 10 meq via ORAL
  Filled 2016-04-05 (×4): qty 1

## 2016-04-05 MED ORDER — TOPIRAMATE 100 MG PO TABS
100.0000 mg | ORAL_TABLET | Freq: Two times a day (BID) | ORAL | Status: DC
  Administered 2016-04-05 – 2016-04-07 (×4): 100 mg via ORAL
  Filled 2016-04-05 (×4): qty 1

## 2016-04-05 MED ORDER — ENOXAPARIN SODIUM 40 MG/0.4ML ~~LOC~~ SOLN
40.0000 mg | SUBCUTANEOUS | Status: DC
  Administered 2016-04-05 – 2016-04-06 (×2): 40 mg via SUBCUTANEOUS
  Filled 2016-04-05 (×2): qty 0.4

## 2016-04-05 MED ORDER — LEVOTHYROXINE SODIUM 50 MCG PO TABS
200.0000 ug | ORAL_TABLET | Freq: Every day | ORAL | Status: DC
  Administered 2016-04-06 – 2016-04-07 (×2): 200 ug via ORAL
  Filled 2016-04-05 (×2): qty 4

## 2016-04-05 MED ORDER — THIORIDAZINE HCL 25 MG PO TABS
50.0000 mg | ORAL_TABLET | Freq: Every day | ORAL | Status: DC
  Administered 2016-04-05 – 2016-04-06 (×2): 50 mg via ORAL
  Filled 2016-04-05 (×2): qty 2
  Filled 2016-04-05 (×2): qty 1

## 2016-04-05 MED ORDER — POLYETHYLENE GLYCOL 3350 17 G PO PACK: 17.0000 g | PACK | Freq: Every day | ORAL | Status: DC | PRN

## 2016-04-05 MED ORDER — ONDANSETRON HCL 4 MG PO TABS: 4.0000 mg | ORAL_TABLET | Freq: Four times a day (QID) | ORAL | Status: DC | PRN

## 2016-04-05 MED ORDER — PANTOPRAZOLE SODIUM 40 MG PO TBEC
80.0000 mg | DELAYED_RELEASE_TABLET | Freq: Every day | ORAL | Status: DC
  Administered 2016-04-05 – 2016-04-07 (×3): 80 mg via ORAL
  Filled 2016-04-05 (×3): qty 2

## 2016-04-05 MED ORDER — SODIUM CHLORIDE 0.9 % IV BOLUS (SEPSIS)
500.0000 mL | Freq: Once | INTRAVENOUS | Status: DC
Start: 2016-04-05 — End: 2016-04-07

## 2016-04-05 MED ORDER — ONDANSETRON HCL 4 MG/2ML IJ SOLN: 4.0000 mg | Freq: Four times a day (QID) | INTRAMUSCULAR | Status: DC | PRN

## 2016-04-05 MED ORDER — LOSARTAN POTASSIUM 50 MG PO TABS
100.0000 mg | ORAL_TABLET | Freq: Every day | ORAL | Status: DC
  Administered 2016-04-06 – 2016-04-07 (×2): 100 mg via ORAL
  Filled 2016-04-05 (×2): qty 2

## 2016-04-05 MED ORDER — DIVALPROEX SODIUM ER 500 MG PO TB24
500.0000 mg | ORAL_TABLET | Freq: Two times a day (BID) | ORAL | Status: DC
  Administered 2016-04-05 – 2016-04-07 (×4): 500 mg via ORAL
  Filled 2016-04-05 (×4): qty 1

## 2016-04-05 MED ORDER — SODIUM CHLORIDE 0.9% FLUSH
3.0000 mL | Freq: Two times a day (BID) | INTRAVENOUS | Status: DC
  Administered 2016-04-05: 3 mL via INTRAVENOUS

## 2016-04-05 MED ORDER — SODIUM CHLORIDE 0.9 % IV BOLUS (SEPSIS)
500.0000 mL | Freq: Once | INTRAVENOUS | Status: AC
  Administered 2016-04-05: 500 mL via INTRAVENOUS

## 2016-04-05 MED ORDER — SODIUM CHLORIDE 0.9 % IV SOLN
Freq: Once | INTRAVENOUS | Status: AC
  Administered 2016-04-05: 1000 mL via INTRAVENOUS

## 2016-04-05 NOTE — ED Notes (Signed)
Patient fell today while walking outside. Per sister patient found on her back. Patient unsure of hitting her head. Denies any pain or dizziness. Patient has MR and is not a good historian. Family states patient has fallen a couple times recently but think it's related to her rolling seat walker. Patient alert and oriented. No contusions or swelling noted at this time. No pain noted with palpation. Patient noted to be hypotensive by EMS. EDP aware.

## 2016-04-05 NOTE — ED Triage Notes (Signed)
Pt brought to ED by [facility]. Pt fell at home, found lying on back outside, alert. Pt alert and oriented x 4 upon arrival to ED. EMS reports pt hypotensive on scene 70s/50s, given 300cc fluid bolus in route to ED. Pt denies pain. nad noted.

## 2016-04-05 NOTE — ED Provider Notes (Signed)
Emergency Department Provider Note   I have reviewed the triage vital signs and the nursing notes.   HISTORY  Chief Complaint Fall   HPI Pam Sanders is a 65 y.o. female with PMH of DM, GERD, HTN, thyroid disease, and seizure disorder presents to the emergency department by EMS after fall. The patient does not recall the exact mechanism of the fall. She was found face up in her front yard. Patient states that she "just fell" and is not sure about head trauma. Denies any CP, SOB, or palpitations immediately prior to falling. Patient denies any other recent fall. Denies fever, chills, or recent illness. Does note some dysuria. Denies HA, neck pain, abdominal pain, or pain in the arms/legs.   Past Medical History:  Diagnosis Date  . Diabetes ([place])   . Gait disturbance   . GERD (gastroesophageal reflux disease)   . HA (headache)   . Hypertension   . Other convulsions   . Seizures ([place])   . Thyroid disease     Patient Active Problem List   Diagnosis Date Noted  . AKI (acute kidney injury) ([place]) 04/05/2016  . Seizures ([place]) 01/05/2013  . Gait disturbance   . Diabetes [facility])     Past Surgical History:  Procedure Laterality Date  . catract    . STOMACH SURGERY      Current Outpatient Rx  . Order #: [id number] Class: Historical Med  . Order #: [id number] Class: Normal  . Order #: [id number] Class: Historical Med  . Order #: [id number] Class: Historical Med  . Order #: [id number] Class: Historical Med  . Order #: [id number] Class: Historical Med  . Order #: [id number] Class: Historical Med  . Order #: [id number] Class: Historical Med  . Order #: [id number] Class: Historical Med  . Order #: [id number] Class: Historical Med  . Order #: [id number] Class: Print  . Order #: [id number] Class: Historical Med  . Order #: [id number] Class: Historical Med  . Order #: [id number] Class: Normal  . Order #: [id number] Class: Historical Med    Allergies Patient has no known allergies.  Family History  Problem  Relation Age of Onset  . Stroke    . Diabetes    . Heart Problems      Social History Social History  Substance Use Topics  . Smoking status: Never Smoker  . Smokeless tobacco: Never Used  . Alcohol use No    Review of Systems  Constitutional: No fever/chills Eyes: No visual changes. ENT: No sore throat. Cardiovascular: Denies chest pain. Respiratory: Denies shortness of breath. Gastrointestinal: No abdominal pain.  No nausea, no vomiting.  No diarrhea.  No constipation. Genitourinary: Negative for dysuria. Musculoskeletal: Negative for back pain. Skin: Negative for rash. Neurological: Negative for focal weakness or numbness. Denies HA.   10-point ROS otherwise negative.  ____________________________________________   PHYSICAL EXAM:  VITAL SIGNS: ED Triage Vitals  Enc Vitals Group     BP 04/05/16 1244 (!) 89/77     Pulse Rate 04/05/16 1244 79     Resp 04/05/16 1244 18     Temp 04/05/16 1244 98.4 F (36.9 C)     SpO2 04/05/16 1244 97 %     Weight 04/05/16 1243 200 lb (90.7 kg)     Height 04/05/16 1243 4\' 10"  (1.473 m)   Constitutional: Alert and oriented. Well appearing and in no acute distress. Patient is hard of hearing.  Eyes: Conjunctivae are normal. PERRL.  Head: Atraumatic. Nose: No congestion/rhinnorhea. Mouth/Throat: Mucous membranes are moist.  Neck: No stridor. No cervical spine tenderness to  palpation. Cardiovascular: Normal rate, regular rhythm. Good peripheral circulation. Grossly normal heart sounds.   Respiratory: Normal respiratory effort.  No retractions. Lungs CTAB. Gastrointestinal: Soft and nontender. No distention.  Musculoskeletal: No lower extremity tenderness nor edema. No gross deformities of extremities. Neurologic:  Normal speech and language. No gross focal neurologic deficits are appreciated.  Skin:  Skin is warm, dry and intact. No rash noted.  ____________________________________________   LABS (all labs ordered are listed,  but only abnormal results are displayed)  Labs Reviewed  COMPREHENSIVE METABOLIC PANEL - Abnormal; Notable for the following:       Result Value   Chloride 100 (*)    BUN 51 (*)    Creatinine, Ser 2.58 (*)    ALT 10 (*)    GFR calc non Af Amer 18 (*)    GFR calc Af Amer 21 (*)    All other components within normal limits  CBC WITH DIFFERENTIAL/PLATELET - Abnormal; Notable for the following:    RBC 3.42 (*)    Hemoglobin 11.8 (*)    HCT 34.7 (*)    MCV 101.5 (*)    MCH 34.5 (*)    All other components within normal limits  URINALYSIS, ROUTINE W REFLEX MICROSCOPIC - Abnormal; Notable for the following:    Hgb urine dipstick MODERATE (*)    All other components within normal limits  VALPROIC ACID LEVEL - Abnormal; Notable for the following:    Valproic Acid Lvl 38 (*)    All other components within normal limits  TROPONIN I   ____________________________________________  EKG   EKG Interpretation  Date/Time:  Saturday April 05 2016 12:53:06 EST Ventricular Rate:  80 PR Interval:    QRS Duration: 88 QT Interval:  552 QTC Calculation: 637 R Axis:   66 Text Interpretation:  Sinus rhythm.  Low voltage, precordial leads Nonspecific T abnrm, anterolateral leads Prolonged QT interval Baseline wander in lead(s) V1 No STEMI. No old tracing for comparison.  Confirmed by Ramesh Moan MD, Geet Hosking [phone]) on 04/05/2016 1:14:52 PM       ____________________________________________  RADIOLOGY  Dg Chest 2 View  Result Date: 04/05/2016 CLINICAL DATA:  HYPOTENSION, Pt brought to ED by [facility]. Pt fell at home, found lying on back outside, alert. Pt alert and oriented x 4 upon arrival to ED. EMS reports pt hypotensive on scene.HISTORY OF DM, HTN, SEIZURES, EXAM: CHEST  2 VIEW COMPARISON:  None. FINDINGS: Cardiac silhouette is borderline enlarged. No mediastinal or hilar masses. No convincing adenopathy. Lung volumes are low with crowding of bronchovascular structures. Allowing for this, the  lungs are clear. No pleural effusion.  No pneumothorax. Skeletal structures are demineralized but grossly intact. IMPRESSION: No acute cardiopulmonary disease. Electronically Signed   By: Lajean Manes M.D.   On: 04/05/2016 14:59   Ct Head Wo Contrast  Result Date: 04/05/2016 CLINICAL DATA:  [place], pt fell at home on the sidewalk, pt was found lying on her back. Hx of seizures,htn, dm/bbj EXAM: CT HEAD WITHOUT CONTRAST TECHNIQUE: Contiguous axial images were obtained from the base of the skull through the vertex without intravenous contrast. COMPARISON:  08/29/2012 FINDINGS: Brain: In there is significant central and cortical atrophy. Prominent ventricular size is stable. There is encephalomalacia of the left occipital lobe with hydrocephalus ex vacuo. 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 Tentorial midline shift towards the left posteriorly. These changes are chronic and stable in appearance. No evidence for acute infarct, mass,  or hemorrhage. Vascular: No hyperdense vessel or unexpected calcification. Skull: Normal. Negative for fracture or focal lesion. Sinuses/Orbits: No acute finding. Other: None IMPRESSION: 1. Chronic encephalomalacia of the left occipital lobe. 2. 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 3.  No evidence for acute intracranial abnormality. Electronically Signed   By: Nolon Nations M.D.   On: 04/05/2016 14:54    ____________________________________________   PROCEDURES  Procedure(s) performed:   Procedures  None ____________________________________________   INITIAL IMPRESSION / ASSESSMENT AND PLAN / ED COURSE  Pertinent labs & imaging results that were available during my care of the patient were reviewed by me and considered in my medical decision making (see chart for details).  Patient presents to the emergency department for evaluation of fall and hypotension on scene. The patient  cannot recall specific details to determine if it was mechanical versus syncope. She was hypotensive on scene. Patient has a seizure history and is reportedly taking Depakote. In evaluation of care everywhere briefly the patient seems to have normal blood pressures from an office visit in October 2017.  02:30 PM Family now at bedside. They state that the patient's fall was witnessed by a neighbor. The patient lives alone and was taking out the trash. The neighbor saw her son remain backwards and fall landing on her back. The neighbor called EMS and alerted family that the patient had fallen again. No reported seizure activity. They report several falls over the last 2 weeks. No known medication changes. They do not recall history of hypotension. Patient remains awake and alert. No complaints.   03:30 PM Discussed patient's case with hospitalist, Dr. Nehemiah Settle.  Recommend admission to tele, obs bed.  I will place holding orders per their request. Patient and family (if present) updated with plan. Care transferred to hosptialist service.  I reviewed all nursing notes, vitals, pertinent old records, EKGs, labs, imaging (as available).   ____________________________________________  FINAL CLINICAL IMPRESSION(S) / ED DIAGNOSES  Final diagnoses:  Fall, initial encounter  AKI (acute kidney injury) ([place])  Hypotension, unspecified hypotension type     MEDICATIONS GIVEN DURING THIS VISIT:  Medications  0.9 %  sodium chloride infusion (1,000 mLs Intravenous New Bag/Given 04/05/16 1519)  sodium chloride 0.9 % bolus 500 mL (0 mLs Intravenous Stopped 04/05/16 1504)     NEW OUTPATIENT MEDICATIONS STARTED DURING THIS VISIT:  None   Note:  This document was prepared using Dragon voice recognition software and may include unintentional dictation errors.  Nanda Quinton, MD Emergency Medicine   Margette Fast, MD 04/05/16 [phone]

## 2016-04-05 NOTE — H&P (Signed)
History and Physical  Pam Sanders NOB:[id number] DOB: [birth year] DOA: 04/05/2016  Referring physician: Dr Laverta Baltimore, ED physician PCP: Sherrie Mustache, MD  Outpatient Specialists:   Dr Daine Floras (Cardiology)  Dr Wardell Honour (Podiatry)  Chief Complaint: Lytle Michaels  HPI: Pam Sanders is a 65 y.o. female with a history of diet-controlled diabetes, essential hypertension, gait disturbance, GERD, seizure disorder on Depakote, history of thyroid disease currently euthyroid on no medications. Patient lives alone, although her sister checks on her and takes care of her every day and there appears to be some cognitive deficits and the patient. The patient was found on the ground in her front yard. The neighbors called EMS the patient's sister. The patient is unsure of mechanism of fall. EMS presented to the patient's house and found that she was hypotensive and brought the patient to the hospital for evaluation.  Per the patient's sister, she falls roughly 2-3 times a month. Usually EMS comes in helps her up and takes her inside. This time, because of her hypotension, the patient was brought to the emergency department. She does have a known gait disturbance and uses a rolling walker, although she still falls occasionally. She denies shortness of breath, chest pain, palpitations [place] prior to falling. She does not recall striking her head and denies headache, neck pain, abdominal pain, pain in her hips or legs.  Emergency Department Course: On arrival, patient's initial blood pressure was 87/70. She was given 800 mL of IV fluids between EMS and emergency department. Workup was relatively negative except for elevated creatinine at 2.58 - it appears that the patient's baseline creatinine is 1.2-1.3.  Review of Systems:   Patient has chronic headaches, chest pain, leg pain.  Pt denies any fevers, chills, nausea, vomiting, diarrhea, constipation, abdominal pain, shortness of breath, dyspnea on exertion,  orthopnea, cough, wheezing, palpitations, headache, vision changes, lightheadedness, dizziness, melena, rectal bleeding.  Review of systems are otherwise negative  Past Medical History:  Diagnosis Date  . Diabetes ([place])   . Gait disturbance   . GERD (gastroesophageal reflux disease)   . HA (headache)   . Hypertension   . Other convulsions   . Seizures ([place])   . Thyroid disease    Past Surgical History:  Procedure Laterality Date  . catract    . STOMACH SURGERY     Social History:  reports that she has never smoked. She has never used smokeless tobacco. She reports that she does not drink alcohol or use drugs. Patient lives at Home  No Known Allergies  Family History  Problem Relation Age of Onset  . Stroke    . Diabetes    . Heart Problems       Prior to Admission medications   Medication Sig Start Date End Date Taking? Authorizing Provider  Cholecalciferol (VITAMIN D-3) 1000 UNITS CAPS Take by mouth daily.   Yes Historical Provider, MD  divalproex (DEPAKOTE ER) 500 MG 24 hr tablet Take 1 tablet (500 mg total) by mouth 2 (two) times daily. 01/05/13  Yes Marcial Pacas, MD  furosemide (LASIX) 40 MG tablet Take 1 tablet by mouth daily. 04/04/16  Yes Historical Provider, MD  hydrochlorothiazide (HYDRODIURIL) 25 MG tablet Take 25 mg by mouth daily.    Yes Historical Provider, MD  levothyroxine (SYNTHROID, LEVOTHROID) 200 MCG tablet Take 200 mcg by mouth daily before breakfast.    Yes Historical Provider, MD  losartan (COZAAR) 100 MG tablet Take 1 tablet by mouth daily. 04/04/16  Yes Historical Provider, MD  Multiple Vitamins-Minerals (MULTIVITAMIN PO) Take by mouth daily.   Yes Historical Provider, MD  naproxen (NAPROSYN) 500 MG tablet Take 1 tablet by mouth 2 (two) times daily. 04/04/16  Yes Historical Provider, MD  Omega-3 Fatty Acids (FISH OIL) 1000 MG CAPS Take by mouth daily.   Yes Historical Provider, MD  omeprazole (PRILOSEC) 20 MG capsule Take 20 mg by mouth 2 (two) times daily.    Yes Historical Provider, MD  potassium chloride SA (K-DUR,KLOR-CON) 20 MEQ tablet Take 1 tablet (20 mEq total) by mouth 2 (two) times daily. Patient taking differently: Take 10 mEq by mouth daily.  7/94/80  Yes Delora Fuel, MD  spironolactone (ALDACTONE) 25 MG tablet Take 1 tablet by mouth daily. 04/04/16  Yes Historical Provider, MD  thioridazine (MELLARIL) 50 MG tablet Take 1 tablet by mouth at bedtime. 04/04/16  Yes Historical Provider, MD  topiramate (TOPAMAX) 100 MG tablet Take 1 tablet (100 mg total) by mouth 2 (two) times daily. 01/05/13  Yes Marcial Pacas, MD  zolpidem (AMBIEN) 10 MG tablet Take 10 mg by mouth at bedtime as needed for sleep.   Yes Historical Provider, MD    Physical Exam: BP 97/69 (BP Location: Right Arm)   Pulse 71   Temp 98.4 F (36.9 C)   Resp 16   Ht 4\' 10"  (1.473 m)   Wt 90.7 kg (200 lb)   SpO2 99%   BMI 41.80 kg/m   General: Elderly Caucasian female. Awake and alert and oriented x3. No acute cardiopulmonary distress.  HEENT: Normocephalic atraumatic.  Right and left ears normal in appearance.  Pupils equal, round, reactive to light. Extraocular muscles are intact. Sclerae anicteric and noninjected.  Moist mucosal membranes. No mucosal lesions.  Neck: Neck supple without lymphadenopathy. No carotid bruits. No masses palpated.  Cardiovascular: Regular rate with normal S1-S2 sounds. No murmurs, rubs, gallops auscultated. No JVD.  Respiratory: Good respiratory effort with no wheezes, rales, rhonchi. Lungs clear to auscultation bilaterally.  No accessory muscle use. Abdomen: Soft, nontender, nondistended. Active bowel sounds. No masses or hepatosplenomegaly  Skin: No rashes, lesions, or ulcerations.  Dry, warm to touch. 2+ dorsalis pedis and radial pulses. Musculoskeletal: No calf or leg pain. All major joints not erythematous nontender.  No upper or lower joint deformation.  Good ROM.  No contractures  Psychiatric: Intact judgment and insight. Pleasant and  cooperative. Neurologic: No focal neurological deficits. Strength is 5/5 and symmetric in upper and lower extremities.  Cranial nerves II through XII are grossly intact.           Labs on Admission: I have personally reviewed following labs and imaging studies  CBC:  Recent Labs Lab 04/05/16 1310  WBC 8.1  NEUTROABS 6.0  HGB 11.8*  HCT 34.7*  MCV 101.5*  PLT 165   Basic Metabolic Panel:  Recent Labs Lab 04/05/16 1310  NA 139  K 4.5  CL 100*  CO2 29  GLUCOSE 99  BUN 51*  CREATININE 2.58*  CALCIUM 9.0   GFR: Estimated Creatinine Clearance: 20.9 mL/min (by C-G formula based on SCr of 2.58 mg/dL (H)). Liver Function Tests:  Recent Labs Lab 04/05/16 1310  AST 21  ALT 10*  ALKPHOS 41  BILITOT 0.8  PROT 7.5  ALBUMIN 3.7   No results for input(s): LIPASE, AMYLASE in the last 168 hours. No results for input(s): AMMONIA in the last 168 hours. Coagulation Profile: No results for input(s): INR, PROTIME in the last 168 hours. Cardiac Enzymes:  Recent Labs Lab  04/05/16 1310  TROPONINI <0.03   BNP (last 3 results) No results for input(s): PROBNP in the last 8760 hours. HbA1C: No results for input(s): HGBA1C in the last 72 hours. CBG: No results for input(s): GLUCAP in the last 168 hours. Lipid Profile: No results for input(s): CHOL, HDL, LDLCALC, TRIG, CHOLHDL, LDLDIRECT in the last 72 hours. Thyroid Function Tests: No results for input(s): TSH, T4TOTAL, FREET4, T3FREE, THYROIDAB in the last 72 hours. Anemia Panel: No results for input(s): VITAMINB12, FOLATE, FERRITIN, TIBC, IRON, RETICCTPCT in the last 72 hours. Urine analysis:    Component Value Date/Time   COLORURINE YELLOW 04/05/2016 1501   APPEARANCEUR CLEAR 04/05/2016 1501   LABSPEC 1.010 04/05/2016 1501   PHURINE 5.0 04/05/2016 1501   GLUCOSEU NEGATIVE 04/05/2016 1501   HGBUR MODERATE (A) 04/05/2016 1501   BILIRUBINUR NEGATIVE 04/05/2016 1501   KETONESUR NEGATIVE 04/05/2016 1501   PROTEINUR  NEGATIVE 04/05/2016 1501   NITRITE NEGATIVE 04/05/2016 1501   LEUKOCYTESUR NEGATIVE 04/05/2016 1501   Sepsis Labs: @LABRCNTIP (procalcitonin:4,lacticidven:4) )No results found for this or any previous visit (from the past 240 hour(s)).   Radiological Exams on Admission: Dg Chest 2 View  Result Date: 04/05/2016 CLINICAL DATA:  HYPOTENSION, Pt brought to ED by [facility]. Pt fell at home, found lying on back outside, alert. Pt alert and oriented x 4 upon arrival to ED. EMS reports pt hypotensive on scene.HISTORY OF DM, HTN, SEIZURES, EXAM: CHEST  2 VIEW COMPARISON:  None. FINDINGS: Cardiac silhouette is borderline enlarged. No mediastinal or hilar masses. No convincing adenopathy. Lung volumes are low with crowding of bronchovascular structures. Allowing for this, the lungs are clear. No pleural effusion.  No pneumothorax. Skeletal structures are demineralized but grossly intact. IMPRESSION: No acute cardiopulmonary disease. Electronically Signed   By: Lajean Manes M.D.   On: 04/05/2016 14:59   Ct Head Wo Contrast  Result Date: 04/05/2016 CLINICAL DATA:  [place], pt fell at home on the sidewalk, pt was found lying on her back. Hx of seizures,htn, dm/bbj EXAM: CT HEAD WITHOUT CONTRAST TECHNIQUE: Contiguous axial images were obtained from the base of the skull through the vertex without intravenous contrast. COMPARISON:  08/29/2012 FINDINGS: Brain: In there is significant central and cortical atrophy. Prominent ventricular size is stable. There is encephalomalacia of the left occipital lobe with hydrocephalus ex vacuo. 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 Tentorial midline shift towards the left posteriorly. These changes are chronic and stable in appearance. No evidence for acute infarct, mass, or hemorrhage. Vascular: No hyperdense vessel or unexpected calcification. Skull: Normal. Negative for fracture or focal lesion. Sinuses/Orbits: No acute  finding. Other: None IMPRESSION: 1. Chronic encephalomalacia of the left occipital lobe. 2. 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 3.  No evidence for acute intracranial abnormality. Electronically Signed   By: Nolon Nations M.D.   On: 04/05/2016 14:54    EKG: Independently reviewed. Sinus rhythm. Nonspecific T wave abnormalities. Prolonged QTC  Assessment/Plan: Principal Problem:   AKI (acute kidney injury) ([place]) Active Problems:   Seizures ([place])   Dehydration   Fall   Essential hypertension   Hypotension   This patient was discussed with the ED physician, including pertinent vitals, physical exam findings, labs, and imaging.  We also discussed care given by the ED provider.  #1 AK I  Observation on telemetry  Likely secondary to dehydration  Bolus another 500 miles in emergency department and keep on IV fluids overnight  Recheck creatinine in the morning  Recheck potassium  Hold diuretics #2 dehydration  Hold diuretics and replace IV fluids as above #3 seizure disorder  Valproic level low, will give patient dose of Depakote tonight #4 fall  Question possible syncope  Telemetry monitoring overnight  Echo in the morning  PT evaluation #5 hypotension  Hold diuretics and blood pressure medication #6 essential hypertension  DVT prophylaxis: Lovenox Consultants: None Code Status: Full code Family Communication: Conversation with sister  Disposition Plan: Pending   Truett Mainland, DO [facility] Pager [phone]  If 7PM-7AM, please contact night-coverage [email or web address] Password [email or web address]

## 2016-04-06 ENCOUNTER — Observation Stay (HOSPITAL_BASED_OUTPATIENT_CLINIC_OR_DEPARTMENT_OTHER): Payer: Medicare Other

## 2016-04-06 DIAGNOSIS — G40909 Epilepsy, unspecified, not intractable, without status epilepticus: Secondary | ICD-10-CM | POA: Diagnosis present

## 2016-04-06 DIAGNOSIS — R269 Unspecified abnormalities of gait and mobility: Secondary | ICD-10-CM | POA: Diagnosis present

## 2016-04-06 DIAGNOSIS — R4189 Other symptoms and signs involving cognitive functions and awareness: Secondary | ICD-10-CM | POA: Diagnosis present

## 2016-04-06 DIAGNOSIS — E876 Hypokalemia: Secondary | ICD-10-CM | POA: Diagnosis not present

## 2016-04-06 DIAGNOSIS — E860 Dehydration: Secondary | ICD-10-CM | POA: Diagnosis present

## 2016-04-06 DIAGNOSIS — I10 Essential (primary) hypertension: Secondary | ICD-10-CM | POA: Diagnosis present

## 2016-04-06 DIAGNOSIS — K219 Gastro-esophageal reflux disease without esophagitis: Secondary | ICD-10-CM | POA: Diagnosis present

## 2016-04-06 DIAGNOSIS — R55 Syncope and collapse: Secondary | ICD-10-CM

## 2016-04-06 DIAGNOSIS — Z833 Family history of diabetes mellitus: Secondary | ICD-10-CM | POA: Diagnosis not present

## 2016-04-06 DIAGNOSIS — N179 Acute kidney failure, unspecified: Secondary | ICD-10-CM | POA: Diagnosis present

## 2016-04-06 DIAGNOSIS — Z823 Family history of stroke: Secondary | ICD-10-CM | POA: Diagnosis not present

## 2016-04-06 DIAGNOSIS — Z79899 Other long term (current) drug therapy: Secondary | ICD-10-CM | POA: Diagnosis not present

## 2016-04-06 DIAGNOSIS — E119 Type 2 diabetes mellitus without complications: Secondary | ICD-10-CM | POA: Diagnosis present

## 2016-04-06 DIAGNOSIS — W19XXXD Unspecified fall, subsequent encounter: Secondary | ICD-10-CM | POA: Diagnosis not present

## 2016-04-06 DIAGNOSIS — I951 Orthostatic hypotension: Secondary | ICD-10-CM | POA: Diagnosis present

## 2016-04-06 DIAGNOSIS — R296 Repeated falls: Secondary | ICD-10-CM | POA: Diagnosis present

## 2016-04-06 LAB — ECHOCARDIOGRAM COMPLETE
HEIGHTINCHES: 58 in
WEIGHTICAEL: 3619.07 [oz_av]

## 2016-04-06 LAB — GLUCOSE, CAPILLARY
GLUCOSE-CAPILLARY: 71 mg/dL (ref 65–99)
Glucose-Capillary: 107 mg/dL — ABNORMAL HIGH (ref 65–99)

## 2016-04-06 LAB — BASIC METABOLIC PANEL
ANION GAP: 4 — AB (ref 5–15)
BUN: 33 mg/dL — AB (ref 6–20)
CHLORIDE: 114 mmol/L — AB (ref 101–111)
CO2: 21 mmol/L — ABNORMAL LOW (ref 22–32)
Calcium: 6.2 mg/dL — CL (ref 8.9–10.3)
Creatinine, Ser: 1.6 mg/dL — ABNORMAL HIGH (ref 0.44–1.00)
GFR, EST AFRICAN AMERICAN: 38 mL/min — AB (ref >60)
GFR, EST NON AFRICAN AMERICAN: 33 mL/min — AB (ref >60)
Glucose, Bld: 72 mg/dL (ref 65–99)
POTASSIUM: 2.5 mmol/L — AB (ref 3.5–5.1)
SODIUM: 139 mmol/L (ref 135–145)

## 2016-04-06 LAB — MAGNESIUM: MAGNESIUM: 2.2 mg/dL (ref 1.7–2.4)

## 2016-04-06 LAB — CBC
HCT: 29 % — ABNORMAL LOW (ref 36.0–46.0)
HEMOGLOBIN: 10 g/dL — AB (ref 12.0–15.0)
MCH: 34.8 pg — AB (ref 26.0–34.0)
MCHC: 34.5 g/dL (ref 30.0–36.0)
MCV: 101 fL — ABNORMAL HIGH (ref 78.0–100.0)
PLATELETS: 176 10*3/uL (ref 150–400)
RBC: 2.87 MIL/uL — AB (ref 3.87–5.11)
RDW: 12.4 % (ref 11.5–15.5)
WBC: 6.3 10*3/uL (ref 4.0–10.5)

## 2016-04-06 MED ORDER — SODIUM CHLORIDE 0.9 % IV SOLN
2.0000 g | Freq: Once | INTRAVENOUS | Status: AC
  Administered 2016-04-06: 2 g via INTRAVENOUS
  Filled 2016-04-06: qty 20

## 2016-04-06 MED ORDER — POTASSIUM CHLORIDE CRYS ER 20 MEQ PO TBCR
40.0000 meq | EXTENDED_RELEASE_TABLET | ORAL | Status: AC
  Administered 2016-04-06 (×4): 40 meq via ORAL
  Filled 2016-04-06 (×4): qty 2

## 2016-04-06 NOTE — Progress Notes (Signed)
  Echocardiogram 2D Echocardiogram has been performed.  Pam Sanders 04/06/2016, 1:57 PM

## 2016-04-06 NOTE — Progress Notes (Signed)
PROGRESS NOTE    Pam Sanders  RAQ:[id number] DOB: [birth year] DOA: 04/05/2016 PCP: Sherrie Mustache, MD     Brief Narrative:  65 year old woman admitted on 12/30 from home after a fall. She has some cognitive deficits at baseline, history of diabetes, hypertension, seizure disorder, GERD, thyroid disease. On EMS arrival she was found to be hypotensive, was also found to have acute renal failure with a creatinine of 2.58 on admission. Admission requested   Assessment & Plan:   Principal Problem:   AKI (acute kidney injury) ([place]) Active Problems:   Seizures ([place])   Dehydration   Fall   Essential hypertension   Hypotension   Fall -Likely due to orthostatic hypotension plus gait imbalance. -We'll request PT evaluation, sister who is primary caregiver believes that patient needs extra help at home. -Blood pressure has improved with IV hydration and cessation of BP meds.  Acute renal failure -Baseline creatinine around 1.5, was 2.15 on admission down to 1.60 on 12/31. -Due to prerenal azotemia and dehydration, will plan on continuing IV fluids today.  Hypokalemia -Replete orally, check magnesium, recheck potassium levels in a.m.  Hypocalcemia -Given 2 g of calcium gluconate, will recheck levels in a.m.   DVT prophylaxis: Lovenox Code Status: Full code Family Communication: Sister at bedside updated on plan of care Disposition Plan: For discharge home in 24-48 hours  Consultants:   None  Procedures:   None  Antimicrobials:  Anti-infectives    None       Subjective: Feels well, does not speak much, sister relays much of the history  Objective: Vitals:   04/05/16 2230 04/06/16 0600 04/06/16 1000 04/06/16 1300  BP: 99/60 (!) 105/54 (!) 98/38 (!) 150/122  Pulse:  77 69 76  Resp:  18 18 18   Temp:  98.2 F (36.8 C) 98.6 F (37 C) 98.2 F (36.8 C)  TempSrc:  Oral Oral Oral  SpO2:    100%  Weight:  102.6 kg (226 lb 3.1 oz)    Height:         Intake/Output Summary (Last 24 hours) at 04/06/16 1706 Last data filed at 04/06/16 1122  Gross per 24 hour  Intake          1594.17 ml  Output              801 ml  Net           793.17 ml   Filed Weights   04/05/16 1243 04/05/16 1757 04/06/16 0600  Weight: 90.7 kg (200 lb) 101.2 kg (223 lb) 102.6 kg (226 lb 3.1 oz)    Examination:  General exam: Alert, awake, oriented x 3 Respiratory system: Clear to auscultation. Respiratory effort normal. Cardiovascular system:RRR. No murmurs, rubs, gallops. Gastrointestinal system: Abdomen is nondistended, soft and nontender. No organomegaly or masses felt. Normal bowel sounds heard. Central nervous system: Alert and oriented. No focal neurological deficits. Extremities: No C/C/E, +pedal pulses Skin: No rashes, lesions or ulcers.     Data Reviewed: I have personally reviewed following labs and imaging studies  CBC:  Recent Labs Lab 04/05/16 1310 04/06/16 0557  WBC 8.1 6.3  NEUTROABS 6.0  --   HGB 11.8* 10.0*  HCT 34.7* 29.0*  MCV 101.5* 101.0*  PLT 197 456   Basic Metabolic Panel:  Recent Labs Lab 04/05/16 1310 04/06/16 0557  NA 139 139  K 4.5 2.5*  CL 100* 114*  CO2 29 21*  GLUCOSE 99 72  BUN 51* 33*  CREATININE 2.58* 1.60*  CALCIUM 9.0 6.2*   GFR: Estimated Creatinine Clearance: 36.3 mL/min (by C-G formula based on SCr of 1.6 mg/dL (H)). Liver Function Tests:  Recent Labs Lab 04/05/16 1310  AST 21  ALT 10*  ALKPHOS 41  BILITOT 0.8  PROT 7.5  ALBUMIN 3.7   No results for input(s): LIPASE, AMYLASE in the last 168 hours. No results for input(s): AMMONIA in the last 168 hours. Coagulation Profile: No results for input(s): INR, PROTIME in the last 168 hours. Cardiac Enzymes:  Recent Labs Lab 04/05/16 1310  TROPONINI <0.03   BNP (last 3 results) No results for input(s): PROBNP in the last 8760 hours. HbA1C: No results for input(s): HGBA1C in the last 72 hours. CBG:  Recent Labs Lab  04/05/16 2122 04/06/16 0524  GLUCAP 140* 71   Lipid Profile: No results for input(s): CHOL, HDL, LDLCALC, TRIG, CHOLHDL, LDLDIRECT in the last 72 hours. Thyroid Function Tests: No results for input(s): TSH, T4TOTAL, FREET4, T3FREE, THYROIDAB in the last 72 hours. Anemia Panel: No results for input(s): VITAMINB12, FOLATE, FERRITIN, TIBC, IRON, RETICCTPCT in the last 72 hours. Urine analysis:    Component Value Date/Time   COLORURINE YELLOW 04/05/2016 1501   APPEARANCEUR CLEAR 04/05/2016 1501   LABSPEC 1.010 04/05/2016 1501   PHURINE 5.0 04/05/2016 1501   GLUCOSEU NEGATIVE 04/05/2016 1501   HGBUR MODERATE (A) 04/05/2016 1501   BILIRUBINUR NEGATIVE 04/05/2016 1501   KETONESUR NEGATIVE 04/05/2016 1501   PROTEINUR NEGATIVE 04/05/2016 1501   NITRITE NEGATIVE 04/05/2016 1501   LEUKOCYTESUR NEGATIVE 04/05/2016 1501   Sepsis Labs: @LABRCNTIP (procalcitonin:4,lacticidven:4)  )No results found for this or any previous visit (from the past 240 hour(s)).       Radiology Studies: Dg Chest 2 View  Result Date: 04/05/2016 CLINICAL DATA:  HYPOTENSION, Pt brought to ED by [facility]. Pt fell at home, found lying on back outside, alert. Pt alert and oriented x 4 upon arrival to ED. EMS reports pt hypotensive on scene.HISTORY OF DM, HTN, SEIZURES, EXAM: CHEST  2 VIEW COMPARISON:  None. FINDINGS: Cardiac silhouette is borderline enlarged. No mediastinal or hilar masses. No convincing adenopathy. Lung volumes are low with crowding of bronchovascular structures. Allowing for this, the lungs are clear. No pleural effusion.  No pneumothorax. Skeletal structures are demineralized but grossly intact. IMPRESSION: No acute cardiopulmonary disease. Electronically Signed   By: Lajean Manes M.D.   On: 04/05/2016 14:59   Ct Head Wo Contrast  Result Date: 04/05/2016 CLINICAL DATA:  [place], pt fell at home on the sidewalk, pt was found lying on her back. Hx of seizures,htn, dm/bbj EXAM: CT HEAD WITHOUT CONTRAST  TECHNIQUE: Contiguous axial images were obtained from the base of the skull through the vertex without intravenous contrast. COMPARISON:  08/29/2012 FINDINGS: Brain: In there is significant central and cortical atrophy. Prominent ventricular size is stable. There is encephalomalacia of the left occipital lobe with hydrocephalus ex vacuo. 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 Tentorial midline shift towards the left posteriorly. These changes are chronic and stable in appearance. No evidence for acute infarct, mass, or hemorrhage. Vascular: No hyperdense vessel or unexpected calcification. Skull: Normal. Negative for fracture or focal lesion. Sinuses/Orbits: No acute finding. Other: None IMPRESSION: 1. Chronic encephalomalacia of the left occipital lobe. 2. 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 3.  No evidence for acute intracranial abnormality. Electronically Signed   By: Nolon Nations M.D.   On: 04/05/2016 14:54  Scheduled Meds: . divalproex  500 mg Oral BID  . enoxaparin (LOVENOX) injection  40 mg Subcutaneous Q24H  . levothyroxine  200 mcg Oral QAC breakfast  . losartan  100 mg Oral Daily  . pantoprazole  80 mg Oral Daily  . potassium chloride SA  10 mEq Oral Daily  . potassium chloride  40 mEq Oral Q4H  . sodium chloride  500 mL Intravenous Once  . sodium chloride flush  3 mL Intravenous Q12H  . thioridazine  50 mg Oral QHS  . topiramate  100 mg Oral BID   Continuous Infusions: . sodium chloride 125 mL/hr at 04/06/16 0221     LOS: 0 days    Time spent: 25 minutes. Greater than 50% of this time was spent in direct contact with the patient coordinating care.     Lelon Frohlich, MD [facility] Pager [phone]  If 7PM-7AM, please contact night-coverage [email or web address] Password [email or web address] 04/06/2016, 5:06 PM

## 2016-04-06 NOTE — Progress Notes (Signed)
Dr. Jerilee Hoh text paged and made aware of critical K+ and Ca+ labs this AM

## 2016-04-07 DIAGNOSIS — W19XXXD Unspecified fall, subsequent encounter: Secondary | ICD-10-CM

## 2016-04-07 LAB — COMPREHENSIVE METABOLIC PANEL
ALBUMIN: 3 g/dL — AB (ref 3.5–5.0)
ALT: 9 U/L — ABNORMAL LOW (ref 14–54)
AST: 17 U/L (ref 15–41)
Alkaline Phosphatase: 42 U/L (ref 38–126)
BILIRUBIN TOTAL: 0.3 mg/dL (ref 0.3–1.2)
BUN: 30 mg/dL — AB (ref 6–20)
CHLORIDE: 112 mmol/L — AB (ref 101–111)
CO2: 24 mmol/L (ref 22–32)
Calcium: 8.5 mg/dL — ABNORMAL LOW (ref 8.9–10.3)
Creatinine, Ser: 1.64 mg/dL — ABNORMAL HIGH (ref 0.44–1.00)
GFR calc Af Amer: 37 mL/min — ABNORMAL LOW (ref >60)
GFR, EST NON AFRICAN AMERICAN: 32 mL/min — AB (ref >60)
Glucose, Bld: 87 mg/dL (ref 65–99)
POTASSIUM: 4.9 mmol/L (ref 3.5–5.1)
Sodium: 138 mmol/L (ref 135–145)
Total Protein: 6.4 g/dL — ABNORMAL LOW (ref 6.5–8.1)

## 2016-04-07 LAB — CBC
HEMATOCRIT: 31.3 % — AB (ref 36.0–46.0)
Hemoglobin: 10.6 g/dL — ABNORMAL LOW (ref 12.0–15.0)
MCH: 34.8 pg — ABNORMAL HIGH (ref 26.0–34.0)
MCHC: 33.9 g/dL (ref 30.0–36.0)
MCV: 102.6 fL — AB (ref 78.0–100.0)
Platelets: 178 10*3/uL (ref 150–400)
RBC: 3.05 MIL/uL — ABNORMAL LOW (ref 3.87–5.11)
RDW: 12.6 % (ref 11.5–15.5)
WBC: 6.2 10*3/uL (ref 4.0–10.5)

## 2016-04-07 LAB — GLUCOSE, CAPILLARY
Glucose-Capillary: 74 mg/dL (ref 65–99)
Glucose-Capillary: 78 mg/dL (ref 65–99)

## 2016-04-07 MED ORDER — FUROSEMIDE 40 MG PO TABS: 20.0000 mg | ORAL_TABLET | Freq: Every day | ORAL | Status: DC

## 2016-04-07 NOTE — NC FL2 (Signed)
[place] LEVEL OF CARE SCREENING TOOL     IDENTIFICATION  Patient Name: Pam Sanders Birthdate: March 22, 1951 Sex: female Admission Date (Current Location): 04/05/2016  [place] and [place] Number:  [place] [facility] [id number] [place] and Address:  [facility] [address] [place]      Provider Number: [id number]  Attending Physician Name and Address:  Koleen Nimrod Acost*  Relative Name and Phone Number:       Current Level of Care: Hospital Recommended Level of Care: [place] Prior Approval Number:    Date Approved/Denied:   PASRR Number: [id number]  Discharge Plan: SNF    Current Diagnoses: Patient Active Problem List   Diagnosis Date Noted  . AKI (acute kidney injury) ([place]) 04/05/2016  . Dehydration 04/05/2016  . Fall 04/05/2016  . Essential hypertension 04/05/2016  . Hypotension 04/05/2016  . Seizures ([place]) 01/05/2013  . Gait disturbance   . Diabetes ([place])     Orientation RESPIRATION BLADDER Height & Weight     Self, Situation, Place  Normal Incontinent Weight: 229 lb 8 oz (104.1 kg) Height:  4\' 10"  (147.3 cm)  BEHAVIORAL SYMPTOMS/MOOD NEUROLOGICAL BOWEL NUTRITION STATUS  Other (Comment) (none) Convulsions/Seizures (history) Incontinent Diet (Heart healthy)  AMBULATORY STATUS COMMUNICATION OF NEEDS Skin   Total Care Verbally Other (Comment) (Blister left arm)                       Personal Care Assistance Level of Assistance  Bathing, Feeding, Dressing Bathing Assistance: Maximum assistance Feeding assistance: Limited assistance Dressing Assistance: Maximum assistance     Functional Limitations Info  Sight, Hearing, Speech Sight Info: Adequate Hearing Info: Impaired Speech Info: Adequate    SPECIAL CARE FACTORS FREQUENCY  PT (By licensed PT)     PT Frequency: 5              Contractures      Additional Factors Info  Psychotropic Code Status Info: Full  code Allergies Info: No known allergies Psychotropic Info: Depakote         Current Medications (04/07/2016):  This is the current hospital active medication list Current Facility-Administered Medications  Medication Dose Route Frequency Provider Last Rate Last Dose  . 0.9 %  sodium chloride infusion   Intravenous Continuous Truett Mainland, DO   Stopped at 04/07/16 1202  . divalproex (DEPAKOTE ER) 24 hr tablet 500 mg  500 mg Oral BID Tanna Savoy Stinson, DO   500 mg at 04/07/16 3086  . enoxaparin (LOVENOX) injection 40 mg  40 mg Subcutaneous Q24H Tanna Savoy Stinson, DO   40 mg at 04/06/16 1530  . levothyroxine (SYNTHROID, LEVOTHROID) tablet 200 mcg  200 mcg Oral QAC breakfast Tanna Savoy Stinson, DO   200 mcg at 04/07/16 0734  . losartan (COZAAR) tablet 100 mg  100 mg Oral Daily Tanna Savoy Stinson, DO   100 mg at 04/07/16 5784  . ondansetron (ZOFRAN) tablet 4 mg  4 mg Oral Q6H PRN Tanna Savoy Stinson, DO       Or  . ondansetron [facility]) injection 4 mg  4 mg Intravenous Q6H PRN Tanna Savoy Stinson, DO      . pantoprazole (PROTONIX) EC tablet 80 mg  80 mg Oral Daily Tanna Savoy Stinson, DO   80 mg at 04/07/16 6962  . polyethylene glycol (MIRALAX / GLYCOLAX) packet 17 g  17 g Oral Daily PRN Tanna Savoy Stinson, DO      . potassium  chloride SA (K-DUR,KLOR-CON) CR tablet 10 mEq  10 mEq Oral Daily Tanna Savoy Stinson, DO   10 mEq at 04/07/16 5790  . sodium chloride 0.9 % bolus 500 mL  500 mL Intravenous Once Truett Mainland, DO      . sodium chloride flush (NS) 0.9 % injection 3 mL  3 mL Intravenous Q12H Tanna Savoy Stinson, DO   3 mL at 04/05/16 2142  . thioridazine (MELLARIL) tablet 50 mg  50 mg Oral QHS Tanna Savoy Stinson, DO   50 mg at 04/06/16 2217  . topiramate (TOPAMAX) tablet 100 mg  100 mg Oral BID Tanna Savoy Stinson, DO   100 mg at 04/07/16 3833  . zolpidem (AMBIEN) tablet 5 mg  5 mg Oral QHS PRN Truett Mainland, DO         Discharge Medications: Please see discharge summary for a list of discharge medications.  Relevant  Imaging Results:  Relevant Lab Results:   Additional Information SSN: [id number]  Salome Arnt, [place]

## 2016-04-07 NOTE — Progress Notes (Signed)
Patient is to be discharged to [facility] and in stable condition. Report called to [facility] and advised patient and family that the patient will be going to room 122A. Family requesting to transport patient. IV and telemetry removed. Patient will be escorted out by staff.   Celestia Khat, RN

## 2016-04-07 NOTE — Evaluation (Signed)
Physical Therapy Evaluation Patient Details Name: Pam Sanders MRN: [id number] DOB: June 21, 1950 Today's Date: 04/07/2016   History of Present Illness  Pam Sanders is a 66 y.o. female with a history of diet-controlled diabetes, essential hypertension, gait disturbance, GERD, seizure disorder on Depakote, history of thyroid disease currently euthyroid on no medications. Patient lives alone, although her sister checks on her and takes care of her every day and there appears to be some cognitive deficits and the patient. The patient was found on the ground in her front yard. The neighbors called EMS the patient's sister. The patient is unsure of mechanism of fall. EMS presented to the patient's house and found that she was hypotensive and brought the patient to the hospital for evaluation.  Clinical Impression  Patient received supine in bed with HOB elevated, pleasant and willing to participate in skilled PT services; sister present throughout evaluation and provided majority of information today. Patient has had frequent falls repeatedly, most being outside with her rollator but multiple falls at home as well; she is unable to get home aide at this point per her sister. Patient does require Mod(A) for supine to sit with HOB elevated today, able to complete sit to stand and bed to chair/chair to bed transfers with RW and min guard, extended time. Patient demonstrates poor safety awareness and does require cues and at one point Min assist for safety during transfer back to bed. Per sister, she spends the majority of her time in her lift chair at home. Patient will continue to receive skilled PT services during her stay in this facility, and will benefit from SNF placement when she is discharged by MD. Patient left supine in bed with sister present, bed alarm activated, and all needs otherwise met.     Follow Up Recommendations SNF    Equipment Recommendations  None recommended by PT     Recommendations for Other Services Speech consult;OT consult     Precautions / Restrictions Precautions Precautions: Fall Restrictions Weight Bearing Restrictions: No      Mobility  Bed Mobility Overal bed mobility: Needs Assistance Bed Mobility: Supine to Sit;Sit to Supine     Supine to sit: HOB elevated;Mod assist Sit to supine: Mod assist   General bed mobility comments: patient frequently reports "I can't do it!"   Transfers Overall transfer level: Needs assistance Equipment used: Rolling walker (2 wheeled) Transfers: Sit to/from [facility] Sit to Stand: Min guard;Min assist Stand pivot transfers: Min guard       General transfer comment: with extended time and RW   Ambulation/Gait                Stairs            Wheelchair Mobility    Modified Rankin (Stroke Patients Only)       Balance Overall balance assessment: History of Falls;Needs assistance Sitting-balance support: Bilateral upper extremity supported Sitting balance-Leahy Scale: Good     Standing balance support: Bilateral upper extremity supported Standing balance-Leahy Scale: Fair Standing balance comment: poor safety awareness                              Pertinent Vitals/Pain Pain Assessment: No/denies pain    Home Living Family/patient expects to be discharged to:: Private residence Living Arrangements: Alone Available Help at Discharge: Family;Available PRN/intermittently Type of Home: Apartment Home Access: Level entry     Home Layout: One level Home  Equipment: Gilford Rile - 4 wheels Additional Comments: rollator     Prior Function Level of Independence: Independent with assistive device(s)         Comments: frequent falls      Hand Dominance        Extremity/Trunk Assessment        Lower Extremity Assessment Lower Extremity Assessment: Generalized weakness    Cervical / Trunk Assessment Cervical / Trunk Assessment:  Kyphotic  Communication   Communication: Other (comment) (sister reports mental deficits )  Cognition Arousal/Alertness: Awake/alert Behavior During Therapy: WFL for tasks assessed/performed Overall Cognitive Status: Within Functional Limits for tasks assessed                      General Comments      Exercises     Assessment/Plan    PT Assessment Patient needs continued PT services  PT Problem List Decreased strength;Decreased mobility;Decreased safety awareness;Decreased coordination;Decreased balance;Decreased knowledge of use of DME          PT Treatment Interventions DME instruction;Therapeutic activities;Gait training;Therapeutic exercise;Patient/family education;Balance training;Functional mobility training;Neuromuscular re-education    PT Goals (Current goals can be found in the Care Plan section)  Acute Rehab PT Goals Patient Stated Goal: stop falls, improve independence  PT Goal Formulation: With family Time For Goal Achievement: 04/21/16 Potential to Achieve Goals: Fair    Frequency Min 3X/week   Barriers to discharge Other (comment);Decreased caregiver support frequent falls, family not able to be there all the time    Co-evaluation               End of Session Equipment Utilized During Treatment: Gait belt Activity Tolerance: Patient tolerated treatment well Patient left: in bed;with call bell/phone within reach;with family/visitor present;with bed alarm set      Functional Assessment Tool Used: Based on skilled clinical assessment of strength, functional transfers, balance, safety awareness  Functional Limitation: Mobility: Walking and moving around Mobility: Walking and Moving Around Current Status (V6160): At least 40 percent but less than 60 percent impaired, limited or restricted Mobility: Walking and Moving Around Goal Status [phone]): At least 20 percent but less than 40 percent impaired, limited or restricted    Time:  1013-1040 PT Time Calculation (min) (ACUTE ONLY): 27 min   Charges:   PT Evaluation $PT Eval Low Complexity: 1 Procedure     PT G Codes:   PT G-Codes **NOT FOR INPATIENT CLASS** Functional Assessment Tool Used: Based on skilled clinical assessment of strength, functional transfers, balance, safety awareness  Functional Limitation: Mobility: Walking and moving around Mobility: Walking and Moving Around Current Status (G2694): At least 40 percent but less than 60 percent impaired, limited or restricted Mobility: Walking and Moving Around Goal Status [phone]): At least 20 percent but less than 40 percent impaired, limited or restricted    Deniece Ree PT, DPT [phone]

## 2016-04-07 NOTE — Clinical Social Work Placement (Signed)
   CLINICAL SOCIAL WORK PLACEMENT  NOTE  Date:  04/07/2016  Patient Details  Name: Pam Sanders MRN: [id number] Date of Birth: 1951/04/01  Clinical Social Work is seeking post-discharge placement for this patient at the [place] level of care (*CSW will initial, date and re-position this form in  chart as items are completed):  Yes   Patient/family provided with [facility] Work Department's list of facilities offering this level of care within the geographic area requested by the patient (or if unable, by the patient's family).  Yes   Patient/family informed of their freedom to choose among providers that offer the needed level of care, that participate in Medicare, Medicaid or managed care program needed by the patient, have an available bed and are willing to accept the patient.  Yes   Patient/family informed of [facility]'s ownership interest in [facility] and [facility], as well as of the fact that they are under no obligation to receive care at these facilities.  PASRR submitted to EDS on       PASRR number received on       Existing PASRR number confirmed on 04/07/16     FL2 transmitted to all facilities in geographic area requested by pt/family on 04/07/16     FL2 transmitted to all facilities within larger geographic area on       Patient informed that his/her managed care company has contracts with or will negotiate with certain facilities, including the following:        Yes   Patient/family informed of bed offers received.  Patient chooses bed at [facility]     Physician recommends and patient chooses bed at      Patient to be transferred to [facility] on 04/07/16.  Patient to be transferred to facility by family     Patient family notified on 04/07/16 of transfer.  Name of family member notified:  [name]- sister     PHYSICIAN       Additional  Comment:    _______________________________________________ Salome Arnt, LCSW 04/07/2016, 2:04 PM [phone]

## 2016-04-07 NOTE — Progress Notes (Signed)
PT Glucose at 0500 was 74. Offered PT juice or snack. PT refused, only wanting to drink cold water. Continue to monitor.

## 2016-04-07 NOTE — Discharge Summary (Signed)
Physician Discharge Summary  TACHINA SPOONEMORE WRU:[id number] DOB: 11-17-1950 DOA: 04/05/2016  PCP: Sherrie Mustache, MD  Admit date: 04/05/2016 Discharge date: 04/07/2016  Time spent: 45 minutes  Recommendations for Outpatient Follow-up:  -To be discharged to SNF today for short-term rehabilitation purposes. -Would recommend checking b met in 3-4 days to reassess renal function and potassium levels..   Discharge Diagnoses:  Principal Problem:   AKI (acute kidney injury) ([place]) Active Problems:   Seizures ([place])   Dehydration   Fall   Essential hypertension   Hypotension   Discharge Condition: Stable and improved  Filed Weights   04/05/16 1757 04/06/16 0600 04/07/16 0438  Weight: 101.2 kg (223 lb) 102.6 kg (226 lb 3.1 oz) 104.1 kg (229 lb 8 oz)    History of present illness:  As per Dr. Nehemiah Settle on 12/30: Pam Sanders is a 66 y.o. female with a history of diet-controlled diabetes, essential hypertension, gait disturbance, GERD, seizure disorder on Depakote, history of thyroid disease currently euthyroid on no medications. Patient lives alone, although her sister checks on her and takes care of her every day and there appears to be some cognitive deficits and the patient. The patient was found on the ground in her front yard. The neighbors called EMS the patient's sister. The patient is unsure of mechanism of fall. EMS presented to the patient's house and found that she was hypotensive and brought the patient to the hospital for evaluation.  Per the patient's sister, she falls roughly 2-3 times a month. Usually EMS comes in helps her up and takes her inside. This time, because of her hypotension, the patient was brought to the emergency department. She does have a known gait disturbance and uses a rolling walker, although she still falls occasionally. She denies shortness of breath, chest pain, palpitations [place] prior to falling. She does not recall striking her head and  denies headache, neck pain, abdominal pain, pain in her hips or legs.  Emergency Department Course: On arrival, patient's initial blood pressure was 87/70. She was given 800 mL of IV fluids between EMS and emergency department. Workup was relatively negative except for elevated creatinine at 2.58 - it appears that the patient's baseline creatinine is 1.2-1.3.  Hospital Course:   Fall -Likely due to orthostatic hypotension plus gait imbalance. -Has been evaluated by physical therapy who recommends SNF for short-term rehabilitation. -Blood pressure has improved with IV hydration and cessation of BP meds.  Acute renal failure -Baseline creatinine around 1.5, was 2.15 on admission down to 1.60 on 12/31. -Due to prerenal azotemia and dehydration, creatinine is close to baseline at 1.6 on discharge.  Hypokalemia -Replaced, magnesium within normal limits.  Hypocalcemia -Replaced.  Procedures:  None   Consultations:  None  Discharge Instructions  Discharge Instructions    Diet - low sodium heart healthy    Complete by:  As directed    Increase activity slowly    Complete by:  As directed      Allergies as of 04/07/2016   No Known Allergies     Medication List    STOP taking these medications   hydrochlorothiazide 25 MG tablet Commonly known as:  HYDRODIURIL   naproxen 500 MG tablet Commonly known as:  NAPROSYN     TAKE these medications   divalproex 500 MG 24 hr tablet Commonly known as:  DEPAKOTE ER Take 1 tablet (500 mg total) by mouth 2 (two) times daily.   Fish Oil 1000 MG Caps Take by  mouth daily.   furosemide 40 MG tablet Commonly known as:  LASIX Take 0.5 tablets (20 mg total) by mouth daily. What changed:  how much to take   levothyroxine 200 MCG tablet Commonly known as:  SYNTHROID, LEVOTHROID Take 200 mcg by mouth daily before breakfast.   losartan 100 MG tablet Commonly known as:  COZAAR Take 1 tablet by mouth daily.   MULTIVITAMIN  PO Take by mouth daily.   omeprazole 20 MG capsule Commonly known as:  PRILOSEC Take 20 mg by mouth 2 (two) times daily.   potassium chloride SA 20 MEQ tablet Commonly known as:  K-DUR,KLOR-CON Take 1 tablet (20 mEq total) by mouth 2 (two) times daily. What changed:  how much to take  when to take this   spironolactone 25 MG tablet Commonly known as:  ALDACTONE Take 1 tablet by mouth daily.   thioridazine 50 MG tablet Commonly known as:  MELLARIL Take 1 tablet by mouth at bedtime.   topiramate 100 MG tablet Commonly known as:  TOPAMAX Take 1 tablet (100 mg total) by mouth 2 (two) times daily.   Vitamin D-3 1000 units Caps Take by mouth daily.   zolpidem 10 MG tablet Commonly known as:  AMBIEN Take 10 mg by mouth at bedtime as needed for sleep.      No Known Allergies    The results of significant diagnostics from this hospitalization (including imaging, microbiology, ancillary and laboratory) are listed below for reference.    Significant Diagnostic Studies: Dg Chest 2 View  Result Date: 04/05/2016 CLINICAL DATA:  HYPOTENSION, Pt brought to ED by [facility]. Pt fell at home, found lying on back outside, alert. Pt alert and oriented x 4 upon arrival to ED. EMS reports pt hypotensive on scene.HISTORY OF DM, HTN, SEIZURES, EXAM: CHEST  2 VIEW COMPARISON:  None. FINDINGS: Cardiac silhouette is borderline enlarged. No mediastinal or hilar masses. No convincing adenopathy. Lung volumes are low with crowding of bronchovascular structures. Allowing for this, the lungs are clear. No pleural effusion.  No pneumothorax. Skeletal structures are demineralized but grossly intact. IMPRESSION: No acute cardiopulmonary disease. Electronically Signed   By: Lajean Manes M.D.   On: 04/05/2016 14:59   Ct Head Wo Contrast  Result Date: 04/05/2016 CLINICAL DATA:  [place], pt fell at home on the sidewalk, pt was found lying on her back. Hx of seizures,htn, dm/bbj EXAM: CT HEAD WITHOUT CONTRAST  TECHNIQUE: Contiguous axial images were obtained from the base of the skull through the vertex without intravenous contrast. COMPARISON:  08/29/2012 FINDINGS: Brain: In there is significant central and cortical atrophy. Prominent ventricular size is stable. There is encephalomalacia of the left occipital lobe with hydrocephalus ex vacuo. 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 Tentorial midline shift towards the left posteriorly. These changes are chronic and stable in appearance. No evidence for acute infarct, mass, or hemorrhage. Vascular: No hyperdense vessel or unexpected calcification. Skull: Normal. Negative for fracture or focal lesion. Sinuses/Orbits: No acute finding. Other: None IMPRESSION: 1. Chronic encephalomalacia of the left occipital lobe. 2. 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 3.  No evidence for acute intracranial abnormality. Electronically Signed   By: Nolon Nations M.D.   On: 04/05/2016 14:54    Microbiology: No results found for this or any previous visit (from the past 240 hour(s)).   Labs: Basic Metabolic Panel:  Recent Labs Lab 04/05/16 1310 04/06/16 0557 04/06/16 1712 04/07/16 0557  NA 139 139  --  138  K 4.5 2.5*  --  4.9  CL 100* 114*  --  112*  CO2 29 21*  --  24  GLUCOSE 99 72  --  87  BUN 51* 33*  --  30*  CREATININE 2.58* 1.60*  --  1.64*  CALCIUM 9.0 6.2*  --  8.5*  MG  --   --  2.2  --    Liver Function Tests:  Recent Labs Lab 04/05/16 1310 04/07/16 0557  AST 21 17  ALT 10* 9*  ALKPHOS 41 42  BILITOT 0.8 0.3  PROT 7.5 6.4*  ALBUMIN 3.7 3.0*   No results for input(s): LIPASE, AMYLASE in the last 168 hours. No results for input(s): AMMONIA in the last 168 hours. CBC:  Recent Labs Lab 04/05/16 1310 04/06/16 0557 04/07/16 0557  WBC 8.1 6.3 6.2  NEUTROABS 6.0  --   --   HGB 11.8* 10.0* 10.6*  HCT 34.7* 29.0* 31.3*  MCV 101.5* 101.0* 102.6*  PLT 197 176  178   Cardiac Enzymes:  Recent Labs Lab 04/05/16 1310  TROPONINI <0.03   BNP: BNP (last 3 results) No results for input(s): BNP in the last 8760 hours.  ProBNP (last 3 results) No results for input(s): PROBNP in the last 8760 hours.  CBG:  Recent Labs Lab 04/05/16 2122 04/06/16 0524 04/06/16 1708 04/07/16 0436 04/07/16 0818  GLUCAP 140* 71 107* 74 78       Signed:  HERNANDEZ ACOSTA,ESTELA  [facility] Pager: [phone] 04/07/2016, 11:47 AM

## 2016-04-07 NOTE — Clinical Social Work Note (Signed)
Clinical Social Work Assessment  Patient Details  Name: Pam Sanders MRN: [id number] Date of Birth: 08/07/1950  Date of referral:  04/07/16               Reason for consult:  Discharge Planning                Permission sought to share information with:  Family Supports Permission granted to share information::  Yes, Verbal Permission Granted  Name::     Pam Sanders  Agency::     Relationship::  sister  Contact Information:     Housing/Transportation Living arrangements for the past 2 months:  Apartment Source of Information:  Patient, Other (Comment Required) (Sister) Patient Interpreter Needed:  None Criminal Activity/Legal Involvement Pertinent to Current Situation/Hospitalization:  No - Comment as needed Significant Relationships:  Siblings Lives with:  Self Do you feel safe going back to the place where you live?  Yes Need for family participation in patient care:  Yes (Comment)  Care giving concerns:  Pt lives alone and is not at baseline.    Social Worker assessment / plan:  CSW met with pt and pt's sister Pam Sanders at bedside. Pt alert, but did not participate much in conversation as her hearing aids were not working properly. Pam Sanders reports pt lives alone and she checks on her daily. She had an aid for 5 days a week, 7 hours a day but recently lost this service. Pt has been falling a lot at home. She fell outside and a neighbor saw it and called EMS. PT evaluated pt and recommend SNF. CSW discussed placement process. Pt was recently at [facility]. Pam Sanders requests that she return there or [facility]. She has discussed with pt.   Employment status:  Disabled (Comment on whether or not currently receiving Disability) Insurance information:  Managed Medicare PT Recommendations:  Skilled Nursing Facility Information / Referral to community resources:  Skilled Nursing Facility  Patient/Family's Response to care:  Pt's sister requests placement at [facility] or [facility].    Patient/Family's Understanding of and Emotional Response to Diagnosis, Current Treatment, and Prognosis:  Pt's sister states she has spoken with pt about rehab and they are agreeable.   Emotional Assessment Appearance:  Appears stated age Attitude/Demeanor/Rapport:  Unable to Assess Affect (typically observed):  Unable to Assess Orientation:  Oriented to Self, Oriented to Place, Oriented to Situation Alcohol / Substance use:  Not Applicable Psych involvement (Current and /or in the community):  No (Comment)  Discharge Needs  Concerns to be addressed:  Discharge Planning Concerns Readmission within the last 30 days:  No Current discharge risk:  Lives alone Barriers to Discharge:  No Barriers Identified   ,  [place], LCSW 04/07/2016, 1:44 PM [phone] 

## 2016-04-28 ENCOUNTER — Encounter (HOSPITAL_COMMUNITY): Payer: Self-pay | Admitting: Emergency Medicine

## 2016-04-28 ENCOUNTER — Emergency Department (HOSPITAL_COMMUNITY): Payer: Medicare Other

## 2016-04-28 ENCOUNTER — Emergency Department (HOSPITAL_COMMUNITY)
Admission: EM | Admit: 2016-04-28 | Discharge: 2016-04-28 | Disposition: A | Discharge status: Home / Self Care | Payer: Medicare Other | Attending: Emergency Medicine | Admitting: Emergency Medicine

## 2016-04-28 DIAGNOSIS — Y9301 Activity, walking, marching and hiking: Secondary | ICD-10-CM | POA: Insufficient documentation

## 2016-04-28 DIAGNOSIS — W19XXXA Unspecified fall, initial encounter: Secondary | ICD-10-CM

## 2016-04-28 DIAGNOSIS — W0110XA Fall on same level from slipping, tripping and stumbling with subsequent striking against unspecified object, initial encounter: Secondary | ICD-10-CM | POA: Diagnosis not present

## 2016-04-28 DIAGNOSIS — E119 Type 2 diabetes mellitus without complications: Secondary | ICD-10-CM | POA: Diagnosis not present

## 2016-04-28 DIAGNOSIS — S0990XA Unspecified injury of head, initial encounter: Secondary | ICD-10-CM | POA: Diagnosis present

## 2016-04-28 DIAGNOSIS — Y929 Unspecified place or not applicable: Secondary | ICD-10-CM | POA: Diagnosis not present

## 2016-04-28 DIAGNOSIS — Y999 Unspecified external cause status: Secondary | ICD-10-CM | POA: Insufficient documentation

## 2016-04-28 DIAGNOSIS — I10 Essential (primary) hypertension: Secondary | ICD-10-CM | POA: Diagnosis not present

## 2016-04-28 DIAGNOSIS — Z79899 Other long term (current) drug therapy: Secondary | ICD-10-CM | POA: Insufficient documentation

## 2016-04-28 DIAGNOSIS — S0003XA Contusion of scalp, initial encounter: Secondary | ICD-10-CM | POA: Diagnosis not present

## 2016-04-28 NOTE — ED Notes (Signed)
Per family member, pt d/c from nursing facility for rehab yesterday. Hx of repeated falls. Today was walking up a ramp and fell backwards, hitting her head. Family witnessed fall, denies LOC. Pt has MR, denies vision changes, HA, or dizziness.

## 2016-04-28 NOTE — ED Provider Notes (Signed)
[facility] DEPT Provider Note   CSN: [id number] Arrival date & time: 04/28/16  1454     History   Chief Complaint Chief Complaint  Patient presents with  . Fall    HPI Pam Sanders is a 66 y.o. female.   Pt slippe patient slippedd and hit her head.  No loc   The history is provided by the patient and a relative. No language interpreter was used.  Fall  This is a new problem. The current episode started 6 to 12 hours ago. The problem occurs rarely. The problem has been resolved. Associated symptoms include headaches. Pertinent negatives include no chest pain and no abdominal pain. Nothing aggravates the symptoms. Nothing relieves the symptoms.    Past Medical History:  Diagnosis Date  . Diabetes ([place])   . Gait disturbance   . GERD (gastroesophageal reflux disease)   . HA (headache)   . Hypertension   . Other convulsions   . Seizures ([place])   . Thyroid disease     Patient Active Problem List   Diagnosis Date Noted  . AKI (acute kidney injury) ([place]) 04/05/2016  . Dehydration 04/05/2016  . Fall 04/05/2016  . Essential hypertension 04/05/2016  . Hypotension 04/05/2016  . Seizures ([place]) 01/05/2013  . Gait disturbance   . Diabetes [facility])     Past Surgical History:  Procedure Laterality Date  . catract    . STOMACH SURGERY      OB History    No data available       Home Medications    Prior to Admission medications   Medication Sig Start Date End Date Taking? Authorizing Provider  alum & mag hydroxide-simeth (MAALOX/MYLANTA) 200-200-20 MG/5ML suspension Take 30 mLs by mouth every 6 (six) hours as needed for indigestion or heartburn.   Yes Historical Provider, MD  Cholecalciferol (VITAMIN D-3) 1000 UNITS CAPS Take 1,000 Units by mouth daily.    Yes Historical Provider, MD  divalproex (DEPAKOTE ER) 500 MG 24 hr tablet Take 1 tablet (500 mg total) by mouth 2 (two) times daily. 01/05/13  Yes Marcial Pacas, MD  furosemide (LASIX) 40 MG tablet Take 0.5 tablets  (20 mg total) by mouth daily. 04/07/16  Yes Estela Leonie Green, MD  levothyroxine (SYNTHROID, LEVOTHROID) 200 MCG tablet Take 200 mcg by mouth daily before breakfast.    Yes Historical Provider, MD  losartan (COZAAR) 100 MG tablet Take 1 tablet by mouth daily. 04/04/16  Yes Historical Provider, MD  Multiple Vitamin (MULTIVITAMIN WITH MINERALS) TABS tablet Take 1 tablet by mouth daily.   Yes Historical Provider, MD  Omega-3 Fatty Acids (FISH OIL) 1000 MG CAPS Take 1 capsule by mouth daily.    Yes Historical Provider, MD  omeprazole (PRILOSEC) 20 MG capsule Take 20 mg by mouth 2 (two) times daily.   Yes Historical Provider, MD  potassium chloride SA (K-DUR,KLOR-CON) 20 MEQ tablet Take 1 tablet (20 mEq total) by mouth 2 (two) times daily. 05/09/52  Yes Delora Fuel, MD  sertraline (ZOLOFT) 50 MG tablet Take 50 mg by mouth daily.   Yes Historical Provider, MD  spironolactone (ALDACTONE) 25 MG tablet Take 1 tablet by mouth daily. 04/04/16  Yes Historical Provider, MD  thioridazine (MELLARIL) 50 MG tablet Take 1 tablet by mouth at bedtime. 04/04/16  Yes Historical Provider, MD  topiramate (TOPAMAX) 100 MG tablet Take 1 tablet (100 mg total) by mouth 2 (two) times daily. 01/05/13  Yes Marcial Pacas, MD  zolpidem (AMBIEN) 5 MG tablet Take 5  mg by mouth at bedtime.    Yes Historical Provider, MD    Family History Family History  Problem Relation Age of Onset  . Stroke    . Diabetes    . Heart Problems      Social History Social History  Substance Use Topics  . Smoking status: Never Smoker  . Smokeless tobacco: Never Used  . Alcohol use No     Allergies   Patient has no known allergies.   Review of Systems Review of Systems  Constitutional: Negative for appetite change and fatigue.  HENT: Negative for congestion, ear discharge and sinus pressure.   Eyes: Negative for discharge.  Respiratory: Negative for cough.   Cardiovascular: Negative for chest pain.  Gastrointestinal: Negative for  abdominal pain and diarrhea.  Genitourinary: Negative for frequency and hematuria.  Musculoskeletal: Negative for back pain.  Skin: Negative for rash.  Neurological: Positive for headaches. Negative for seizures.  Psychiatric/Behavioral: Negative for hallucinations.     Physical Exam Updated Vital Signs BP (!) 93/53   Pulse 68   Temp 98.1 F (36.7 C) (Oral)   Resp 18   SpO2 100%   Physical Exam  Constitutional: She is oriented to person, place, and time. She appears well-developed.  HENT:  Head: Normocephalic.  Swelling to back of head  Eyes: Conjunctivae and EOM are normal. No scleral icterus.  Neck: Neck supple. No thyromegaly present.  Cardiovascular: Normal rate and regular rhythm.  Exam reveals no gallop and no friction rub.   No murmur heard. Pulmonary/Chest: No stridor. She has no wheezes. She has no rales. She exhibits no tenderness.  Abdominal: She exhibits no distension. There is no tenderness. There is no rebound.  Musculoskeletal: Normal range of motion. She exhibits no edema.  Lymphadenopathy:    She has no cervical adenopathy.  Neurological: She is oriented to person, place, and time. She exhibits normal muscle tone. Coordination normal.  Skin: No rash noted. No erythema.  Psychiatric: She has a normal mood and affect. Her behavior is normal.     ED Treatments / Results  Labs (all labs ordered are listed, but only abnormal results are displayed) Labs Reviewed - No data to display  EKG  EKG Interpretation None       Radiology Ct Head Wo Contrast  Result Date: 04/28/2016 CLINICAL DATA:  Discharged from nursing facility yesterday, witnessed fall today while walking up a ramp, struck posterior head. No loss of consciousness. History of seizure disorder on chronic Depakote, recurrent falls, hypertension, diabetes. EXAM: CT HEAD WITHOUT CONTRAST CT CERVICAL SPINE WITHOUT CONTRAST TECHNIQUE: Multidetector CT imaging of the head and cervical spine was  performed following the standard protocol without intravenous contrast. Multiplanar CT image reconstructions of the cervical spine were also generated. COMPARISON:  CT HEAD April 05, 2016 FINDINGS: CT HEAD FINDINGS BRAIN: Stable moderate to severe ventriculomegaly with ex vacuo dilatation LEFT greater than RIGHT occipital horns, LEFT occipital porencephaly and encephalomalacia. No intraparenchymal hemorrhage, mass effect, midline shift or acute large vascular territory infarct. No abnormal extra-axial fluid collections. Basal cisterns are patent. Extensive predominately LEFT dural calcifications. VASCULAR: Moderate calcific atherosclerosis of the carotid siphons. SKULL: No skull fracture. Hypoplastic LEFT calvarium. No significant scalp soft tissue swelling. SINUSES/ORBITS: The mastoid air-cells and included paranasal sinuses are well-aerated. Status post bilateral ocular lens implants. The included ocular globes and orbital contents are non-suspicious. OTHER: None. CT CERVICAL SPINE FINDINGS ALIGNMENT: Straightened lordosis. Minimal grade 1 C4-5 anterolisthesis on degenerative basis. SKULL BASE AND VERTEBRAE: Cervical  vertebral bodies and posterior elements are intact. Moderate to severe C5-6 and C6-7 disc height loss, endplate sclerosis and marginal spurring compatible with degenerative discs. RIGHT C2-3 and C3-4 facets fused on degenerative basis. C1-2 articulation maintained. No destructive bony lesions. Multilevel moderate to severe RIGHT facet arthropathy. SOFT TISSUES AND SPINAL CANAL: Normal. DISC LEVELS: No significant osseous canal stenosis or neural foraminal narrowing. UPPER CHEST: Lung apices are clear. OTHER: None. IMPRESSION: CT HEAD: No acute intracranial process. 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 CT CERVICAL SPINE: No acute fracture. Minimal grade 1 C4-5 anterolisthesis on degenerative basis. Electronically  Signed   By: Elon Alas M.D.   On: 04/28/2016 19:08   Ct Cervical Spine Wo Contrast  Result Date: 04/28/2016 CLINICAL DATA:  Discharged from nursing facility yesterday, witnessed fall today while walking up a ramp, struck posterior head. No loss of consciousness. History of seizure disorder on chronic Depakote, recurrent falls, hypertension, diabetes. EXAM: CT HEAD WITHOUT CONTRAST CT CERVICAL SPINE WITHOUT CONTRAST TECHNIQUE: Multidetector CT imaging of the head and cervical spine was performed following the standard protocol without intravenous contrast. Multiplanar CT image reconstructions of the cervical spine were also generated. COMPARISON:  CT HEAD April 05, 2016 FINDINGS: CT HEAD FINDINGS BRAIN: Stable moderate to severe ventriculomegaly with ex vacuo dilatation LEFT greater than RIGHT occipital horns, LEFT occipital porencephaly and encephalomalacia. No intraparenchymal hemorrhage, mass effect, midline shift or acute large vascular territory infarct. No abnormal extra-axial fluid collections. Basal cisterns are patent. Extensive predominately LEFT dural calcifications. VASCULAR: Moderate calcific atherosclerosis of the carotid siphons. SKULL: No skull fracture. Hypoplastic LEFT calvarium. No significant scalp soft tissue swelling. SINUSES/ORBITS: The mastoid air-cells and included paranasal sinuses are well-aerated. Status post bilateral ocular lens implants. The included ocular globes and orbital contents are non-suspicious. OTHER: None. CT CERVICAL SPINE FINDINGS ALIGNMENT: Straightened lordosis. Minimal grade 1 C4-5 anterolisthesis on degenerative basis. SKULL BASE AND VERTEBRAE: Cervical vertebral bodies and posterior elements are intact. Moderate to severe C5-6 and C6-7 disc height loss, endplate sclerosis and marginal spurring compatible with degenerative discs. RIGHT C2-3 and C3-4 facets fused on degenerative basis. C1-2 articulation maintained. No destructive bony lesions. Multilevel  moderate to severe RIGHT facet arthropathy. SOFT TISSUES AND SPINAL CANAL: Normal. DISC LEVELS: No significant osseous canal stenosis or neural foraminal narrowing. UPPER CHEST: Lung apices are clear. OTHER: None. IMPRESSION: CT HEAD: No acute intracranial process. 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 CT CERVICAL SPINE: No acute fracture. Minimal grade 1 C4-5 anterolisthesis on degenerative basis. Electronically Signed   By: Elon Alas M.D.   On: 04/28/2016 19:08    Procedures Procedures (including critical care time)  Medications Ordered in ED Medications - No data to display   Initial Impression / Assessment and Plan / ED Course  I have reviewed the triage vital signs and the nursing notes.  Pertinent labs & imaging results that were available during my care of the patient were reviewed by me and considered in my medical decision making (see chart for details).     Contusion to occipital head  Final Clinical Impressions(s) / ED Diagnoses   Final diagnoses:  Fall, initial encounter    New Prescriptions New Prescriptions   No medications on file     Milton Ferguson, MD 04/28/16 1939

## 2016-04-28 NOTE — ED Notes (Signed)
Patient taken to bathroom via wheelchair. Patient returned to room awaiting discharge papers.

## 2016-04-28 NOTE — ED Triage Notes (Signed)
Pt reports she walks with walker and stumbled backwards and hit her head. Denies loc. nad noted.

## 2016-04-28 NOTE — Discharge Instructions (Signed)
Follow-up with her doctor this week for recheck

## 2016-06-04 ENCOUNTER — Encounter: Payer: Self-pay | Admitting: Neurology

## 2016-06-04 ENCOUNTER — Telehealth: Payer: Self-pay | Admitting: Neurology

## 2016-06-04 ENCOUNTER — Ambulatory Visit (INDEPENDENT_AMBULATORY_CARE_PROVIDER_SITE_OTHER): Payer: Medicare Other | Admitting: Neurology

## 2016-06-04 VITALS — BP 102/68 | HR 73 | Ht <= 58 in | Wt 224.5 lb

## 2016-06-04 DIAGNOSIS — R569 Unspecified convulsions: Secondary | ICD-10-CM

## 2016-06-04 DIAGNOSIS — R251 Tremor, unspecified: Secondary | ICD-10-CM | POA: Diagnosis not present

## 2016-06-04 DIAGNOSIS — R269 Unspecified abnormalities of gait and mobility: Secondary | ICD-10-CM

## 2016-06-04 DIAGNOSIS — W19XXXA Unspecified fall, initial encounter: Secondary | ICD-10-CM

## 2016-06-04 MED ORDER — LAMOTRIGINE 100 MG PO TABS: 100.0000 mg | ORAL_TABLET | Freq: Two times a day (BID) | ORAL | 11 refills | Status: DC

## 2016-06-04 MED ORDER — LAMOTRIGINE 25 MG PO TABS: ORAL_TABLET | ORAL | 0 refills | Status: DC

## 2016-06-04 NOTE — Telephone Encounter (Signed)
Richardson Landry with [facility] is calling to discuss lamoTRIgine (LAMICTAL) 100 MG tablet. Is the patient stopping other seizure medications?

## 2016-06-04 NOTE — Telephone Encounter (Signed)
Reviewed chart with Dr. Krista Blue.  The patient will stay on her current dose of Topamax and start Lamictal.  She will be tapering off Depakote over the next four weeks. I have spoken with the Stu at [facility] and he has been updated.

## 2016-06-04 NOTE — Patient Instructions (Signed)
Start new medication lamotrigine titrating to 100 mg twice a day  Tapering off Depakote  First week keep current dose of Depakote 500 mg 1 tablet twice a day Second week week decreased Depakote to 1 tablet at night Third week,  Depakote 1 tablet at night   Fourth week stopped Depakote

## 2016-06-04 NOTE — Progress Notes (Signed)
PATIENT: Pam Sanders DOB: 01-07-1951  Chief Complaint  Patient presents with  . Seizures    She is here with her sister, Pam Sanders.  No seizure activity reported.  Last seen 01/18/13. She is still taking Depakote ER 500mg , BID and Topamax 100mg , BID.  Pam Sanders says these medications have been managed by her PCP.  . Fall/worsening gait    She is here for worsening gait and frequent falls.  She is using a rolling walker to assist with ambulation.  . Tremors    Her bilateral hand tremors are causing her difficulty with eating and drinking (left hand worse than right).  . PCP    Pam Housekeeper, MD     HISTORICAL  Pam Sanders is a left-handed female, accompanied by her sister Pam Sanders, seen in refer by her primary care doctor Pam Sanders from [place] for evaluation of seizure, initial evaluation was on February 20 eighth 2018  I reviewed and summarized referring note, she had a history of hypertension, hypothyroidism, frequent fall, schizophrenia, seizure, she lives in her apartment by herself, her sister spend at least 40 hours or more with her on a weekly basis. She has been on disability for a long time  For her seizure she is taking Topamax 100 mg twice a day, and Depakote ER 500 mg twice a day, last seizure was in 2010.  Since 2016, she was noted to have steady functional decline, she needed her sister to help her change cloth, wiped after using bathroom, she ambulate with a walker, she has gradually worsening bilateral hands left more than right hand shaking, especially when she used her left hand, difficulty holding utensils still, she uses walker to ambulate, now wearing hearing aid,  Laboratory evaluation in 2018, slight decreased TSH 0.43, LDL was 95, elevated triglyceride 236, cholesterol 177, CMP showed elevated creatinine 2.2, glucose 103, BUN 41, normal liver functional tasks, CBC showed mild anemia hemoglobin of 10.7,  Also personally reviewed and compared CAT scan of  the brain in 2018 to previous scan in 2014, no acute abnormality, moderate to severe atrophy, 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   REVIEW OF SYSTEMS: Full 14 system review of systems performed and notable only for  Weight gain  ALLERGIES: No Known Allergies  HOME MEDICATIONS: Current Outpatient Prescriptions  Medication Sig Dispense Refill  . alum & mag hydroxide-simeth (MAALOX/MYLANTA) 200-200-20 MG/5ML suspension Take 30 mLs by mouth every 6 (six) hours as needed for indigestion or heartburn.    . Cholecalciferol (VITAMIN D-3) 1000 UNITS CAPS Take 1,000 Units by mouth daily.     . divalproex (DEPAKOTE ER) 500 MG 24 hr tablet Take 1 tablet (500 mg total) by mouth 2 (two) times daily. 30 tablet 12  . furosemide (LASIX) 40 MG tablet Take 0.5 tablets (20 mg total) by mouth daily. 30 tablet   . levothyroxine (SYNTHROID, LEVOTHROID) 200 MCG tablet Take 200 mcg by mouth daily before breakfast.     . losartan (COZAAR) 100 MG tablet Take 1 tablet by mouth daily.    . Multiple Vitamin (MULTIVITAMIN WITH MINERALS) TABS tablet Take 1 tablet by mouth daily.    . Omega-3 Fatty Acids (FISH OIL) 1000 MG CAPS Take 1 capsule by mouth daily.     Marland Kitchen omeprazole (PRILOSEC) 20 MG capsule Take 20 mg by mouth 2 (two) times daily.    Marland Kitchen  potassium chloride SA (K-DUR,KLOR-CON) 20 MEQ tablet Take 1 tablet (20 mEq total) by mouth 2 (two) times daily. 20 tablet 0  . sertraline (ZOLOFT) 50 MG tablet Take 50 mg by mouth daily.    Marland Kitchen spironolactone (ALDACTONE) 25 MG tablet Take 1 tablet by mouth daily.    Marland Kitchen thioridazine (MELLARIL) 50 MG tablet Take 1 tablet by mouth at bedtime.    . topiramate (TOPAMAX) 100 MG tablet Take 1 tablet (100 mg total) by mouth 2 (two) times daily. 60 tablet 12  . zolpidem (AMBIEN) 5 MG tablet Take 5 mg by mouth at bedtime.      No current  facility-administered medications for this visit.     PAST MEDICAL HISTORY: Past Medical History:  Diagnosis Date  .    Marland Kitchen Gait disturbance   . GERD (gastroesophageal reflux disease)   . HA (headache)   . Hypertension   . Other convulsions   . Seizures ([place])   . Thyroid disease   . Tremor     PAST SURGICAL HISTORY: Past Surgical History:  Procedure Laterality Date  . catract    . STOMACH SURGERY      FAMILY HISTORY: Family History  Problem Relation Age of Onset  . Stroke Mother   . Stroke Father   . Stroke    . Diabetes    . Heart Problems      SOCIAL HISTORY:  Social History   Social History  . Marital status: Single    Spouse name: N/A  . Number of children: 0  . Years of education: HS   Occupational History  . Disabled    Social History Main Topics  . Smoking status: Never Smoker  . Smokeless tobacco: Never Used  . Alcohol use No  . Drug use: No  . Sexual activity: Not on file   Other Topics Concern  . Not on file   Social History Narrative   Patient lives at home alone. Patient has a high school education. Her sister stays with her 40 hours per week.   Caffeine - one soda daily.     PHYSICAL EXAM   Vitals:   06/04/16 1040  BP: 102/68  Pulse: 73  Weight: 224 lb 8 oz (101.8 kg)  Height: 4\' 10"  (1.473 m)    Not recorded      Body mass index is 46.92 kg/m.  PHYSICAL EXAMNIATION:  Gen: NAD, conversant, well nourised, obese, well groomed                     Cardiovascular: Regular rate rhythm, no peripheral edema, warm, nontender. Eyes: Conjunctivae clear without exudates or hemorrhage Neck: Supple, no carotid bruits. Pulmonary: Clear to auscultation bilaterally   NEUROLOGICAL EXAM:  MENTAL STATUS: she depend on her sister wants her questions , Following commands  Speech/Cognition::    Speech is normal; fluent and spontaneous with normal comprehension.        CRANIAL NERVES: CN II: Visual fields are full to confrontation.  Fundoscopic exam is normal with sharp discs and no vascular changes. Pupils are round equal and briskly reactive to light. CN III, IV, VI: extraocular movement are normal. No ptosis. CN V: Facial sensation is intact to pinprick in all 3 divisions bilaterally. Corneal responses are intact.  CN VII: Face is symmetric with normal eye closure and smile. CN VIII: Hearing is normal to rubbing fingers CN IX, X: Palate elevates symmetrically. Phonation is normal. CN XI: Head turning and shoulder shrug are  intact CN XII: Tongue is midline with normal movements and no atrophy.  MOTOR:  she has mild left hand more than right posturing tremor, no significant rigidity, no weakness  REFLEXES: Reflexes are 2+ and symmetric at the biceps, triceps, knees, and ankles. Plantar responses are flexor.  SENSORY: Intact to light touch, pinprick, positional sensation and vibratory sensation are intact in fingers and toes.  COORDINATION: Rapid alternating movements and fine finger movements are intact. There is no dysmetria on finger-to-nose and heel-knee-shin.    GAIT/STANCE:  she rely on her walker to get up  from seated position, wide base, cautous   DIAGNOSTIC DATA (LABS, IMAGING, TESTING) - I reviewed patient records, labs, notes, testing and imaging myself where available.   ASSESSMENT AND PLAN  Ranee Johnette Abraham Morency is a 66 y.o. female   Epilepsy, last seizure in 2010  Mild mental retardation, neonatal insult  Increased bilateral hands tremor left worse than right   This could be associated with Dpakote side effect, Will tapering off Depakote   titrating dose of  Lamotrigine 100 mg twice a day   keep Topamax 100 mg twice a day   Marcial Pacas, M.D. Ph.D.  [facility] [address], [place], [place] [address] Ph: [phone] Fax: [phone]  CC: Pam Housekeeper, MD

## 2016-07-17 LAB — HM HEPATITIS C SCREENING LAB: HM Hepatitis Screen: NEGATIVE

## 2016-09-02 ENCOUNTER — Ambulatory Visit (INDEPENDENT_AMBULATORY_CARE_PROVIDER_SITE_OTHER): Payer: Medicare Other | Admitting: Neurology

## 2016-09-02 ENCOUNTER — Encounter: Payer: Self-pay | Admitting: Neurology

## 2016-09-02 ENCOUNTER — Encounter (INDEPENDENT_AMBULATORY_CARE_PROVIDER_SITE_OTHER): Payer: Self-pay

## 2016-09-02 VITALS — BP 142/77 | HR 89 | Ht <= 58 in | Wt 234.0 lb

## 2016-09-02 DIAGNOSIS — R569 Unspecified convulsions: Secondary | ICD-10-CM

## 2016-09-02 DIAGNOSIS — R269 Unspecified abnormalities of gait and mobility: Secondary | ICD-10-CM | POA: Diagnosis not present

## 2016-09-02 DIAGNOSIS — W19XXXA Unspecified fall, initial encounter: Secondary | ICD-10-CM

## 2016-09-02 DIAGNOSIS — R251 Tremor, unspecified: Secondary | ICD-10-CM | POA: Diagnosis not present

## 2016-09-02 NOTE — Progress Notes (Signed)
PATIENT: Pam Sanders DOB: Nov 26, 1950  Chief Complaint  Patient presents with  . Seizures    She is here with her sister, [name].  She is doing well with Lamictal 100mg , BID and Topamax 100mg , BID.  No seizure activity reported.  . Gait Difficulty    She uses a rolling walker to assist with ambulation.  . Tremors    She feels her tremors have improved since stopping her Depakote.     HISTORICAL  Pam Sanders is a left-handed female, accompanied by her sister [name], seen in refer by her primary care doctor [name] from [place] for evaluation of seizure, initial evaluation was on February 20 eighth 2018  I reviewed and summarized referring note, she had a history of hypertension, hypothyroidism, frequent fall, schizophrenia, seizure, she lives in her apartment by herself, her sister spend at least 40 hours or more with her on a weekly basis. She has been on disability for a long time  For her seizure she is taking Topamax 100 mg twice a day, and Depakote ER 500 mg twice a day, last seizure was in 2010.  Since 2016, she was noted to have steady functional decline, she needed her sister to help her change cloth, wiped after using bathroom, she ambulate with a walker, she has gradually worsening bilateral hands left more than right hand shaking, especially when she used her left hand, difficulty holding utensils still, she uses walker to ambulate, now wearing hearing aid,  Laboratory evaluation in 2018, slight decreased TSH 0.43, LDL was 95, elevated triglyceride 236, cholesterol 177, CMP showed elevated creatinine 2.2, glucose 103, BUN 41, normal liver functional tasks, CBC showed mild anemia hemoglobin of 10.7,  Also personally reviewed and compared CAT scan of the brain in 2018 to previous scan in 2014, no acute abnormality, moderate to severe atrophy, 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   UPDATE Sep 02 2016: She is accompanied by her sister [name] at today's clinical visit, she is doing better with lamotrigine 100 mg twice a day, Topamax 100 mg twice a day, she has less tremor after stopping Depakote   I reviewed CT abdomen pelvis without contrast report: Anterior abdominal wall hernias containing transverse colon, fat, omentum, small hiatal hernia, cholelithiasis, bilateral renal cortical atrophy and renal insufficiency, aortic atherosclerosis without aneurysm, fibroid, nonspecific right ovarian enlargement, she lives in her own apartment, her sister takes care of her 8 hours each day  She is taken off naproxen now, was diagnosed with chronic kidney disease, laboratory evaluation in January 2018, creatinine 1.64 elevated, GFR was 32.    REVIEW OF SYSTEMS: Full 14 system review of systems performed and notable only for  Weight gain, swollen abdomen, shortness of breath, choking, chest tightness, joint pain, back pain, neck pain, stiffness, headache  ALLERGIES: No Known Allergies  HOME MEDICATIONS: Current Outpatient Prescriptions  Medication Sig Dispense Refill  . alum & mag hydroxide-simeth (MAALOX/MYLANTA) 200-200-20 MG/5ML suspension Take 30 mLs by mouth every 6 (six) hours as needed for indigestion or heartburn.    . Cholecalciferol (VITAMIN D-3) 1000 UNITS CAPS Take 1,000 Units by mouth daily.     . furosemide (LASIX) 40 MG tablet Take 0.5 tablets (20 mg total) by mouth daily. 30 tablet   . lamoTRIgine (LAMICTAL) 100 MG tablet Take 1 tablet (100 mg total) by mouth 2 (two) times daily.  60 tablet 11  . levothyroxine (SYNTHROID, LEVOTHROID) 200 MCG tablet Take 200 mcg by mouth daily before breakfast.     . losartan (COZAAR) 100 MG tablet Take 1 tablet by mouth daily.    . Multiple Vitamin (MULTIVITAMIN WITH MINERALS) TABS tablet Take 1 tablet by mouth daily.    . Omega-3 Fatty Acids (FISH OIL) 1000 MG  CAPS Take 1 capsule by mouth daily.     . potassium chloride SA (K-DUR,KLOR-CON) 20 MEQ tablet Take 1 tablet (20 mEq total) by mouth 2 (two) times daily. 20 tablet 0  . ranitidine (ZANTAC) 150 MG tablet Take 150 mg by mouth at bedtime.    . sertraline (ZOLOFT) 50 MG tablet Take 50 mg by mouth daily.    Marland Kitchen spironolactone (ALDACTONE) 25 MG tablet Take 1 tablet by mouth daily.    Marland Kitchen thioridazine (MELLARIL) 50 MG tablet Take 1 tablet by mouth at bedtime.    . topiramate (TOPAMAX) 100 MG tablet Take 1 tablet (100 mg total) by mouth 2 (two) times daily. 60 tablet 12  . zolpidem (AMBIEN) 5 MG tablet Take 5 mg by mouth at bedtime.      No current facility-administered medications for this visit.     PAST MEDICAL HISTORY: Past Medical History:  Diagnosis Date  .    Marland Kitchen Gait disturbance   . GERD (gastroesophageal reflux disease)   . HA (headache)   . Hypertension   . Other convulsions   . Seizures ([place])   . Thyroid disease   . Tremor     PAST SURGICAL HISTORY: Past Surgical History:  Procedure Laterality Date  . catract    . STOMACH SURGERY      FAMILY HISTORY: Family History  Problem Relation Age of Onset  . Stroke Mother   . Stroke Father   . Stroke Unknown   . Diabetes Unknown   . Heart Problems Unknown     SOCIAL HISTORY:  Social History   Social History  . Marital status: Single    Spouse name: N/A  . Number of children: 0  . Years of education: HS   Occupational History  . Disabled    Social History Main Topics  . Smoking status: Never Smoker  . Smokeless tobacco: Never Used  . Alcohol use No  . Drug use: No  . Sexual activity: Not on file   Other Topics Concern  . Not on file   Social History Narrative   Patient lives at home alone. Patient has a high school education. Her sister stays with her 40 hours per week.   Caffeine - one soda daily.     PHYSICAL EXAM   Vitals:   09/02/16 1245  BP: (!) 142/77  Pulse: 89  Weight: 234 lb (106.1 kg)    Height: 4\' 10"  (1.473 m)    Not recorded      Body mass index is 48.91 kg/m.  PHYSICAL EXAMNIATION:  Gen: NAD, conversant, well nourised, obese, well groomed                     Cardiovascular: Regular rate rhythm, no peripheral edema, warm, nontender. Eyes: Conjunctivae clear without exudates or hemorrhage Neck: Supple, no carotid bruits. Pulmonary: Clear to auscultation bilaterally   NEUROLOGICAL EXAM:  MENTAL STATUS: she depend on her sister wants her questions , Following commands  Speech/Cognition::    Speech is normal; fluent and spontaneous with normal comprehension.        CRANIAL NERVES: CN  II: Visual fields are full to confrontation. Fundoscopic exam is normal with sharp discs and no vascular changes. Pupils are round equal and briskly reactive to light. CN III, IV, VI: extraocular movement are normal. No ptosis. CN V: Facial sensation is intact to pinprick in all 3 divisions bilaterally. Corneal responses are intact.  CN VII: Face is symmetric with normal eye closure and smile. CN VIII: Hearing is normal to rubbing fingers CN IX, X: Palate elevates symmetrically. Phonation is normal. CN XI: Head turning and shoulder shrug are intact CN XII: Tongue is midline with normal movements and no atrophy.  MOTOR:  she has mild left hand more than right posturing tremor, no significant rigidity, no weakness  REFLEXES: Reflexes are 2+ and symmetric at the biceps, triceps, knees, and ankles. Plantar responses are flexor.  SENSORY: Intact to light touch, pinprick, positional sensation and vibratory sensation are intact in fingers and toes.  COORDINATION: Rapid alternating movements and fine finger movements are intact. There is no dysmetria on finger-to-nose and heel-knee-shin.    GAIT/STANCE:  she rely on her walker to get up  from seated position, wide base, cautous   DIAGNOSTIC DATA (LABS, IMAGING, TESTING) - I reviewed patient records, labs, notes, testing and  imaging myself where available.   ASSESSMENT AND PLAN  Chalese Johnette Abraham Lanter is a 66 y.o. female   Epilepsy, last seizure in 2010 Mild mental retardation, neonatal insult  CT head without contrast in January 28 showed moderate to severe atrophy and left occipital porencephaly compatible with your neonatal insult Increased bilateral hands tremor left worse than right  Keep  Lamotrigine 100 mg twice a day and Topamax 100 mg twice a day   Marcial Pacas, M.D. Ph.D.  [facility] [address], [place], [place] [address] Ph: [phone] Fax: [phone]  CC: [name], MD

## 2017-03-05 ENCOUNTER — Ambulatory Visit (INDEPENDENT_AMBULATORY_CARE_PROVIDER_SITE_OTHER): Payer: Medicare Other | Admitting: Neurology

## 2017-03-05 ENCOUNTER — Encounter: Payer: Self-pay | Admitting: Neurology

## 2017-03-05 ENCOUNTER — Encounter (INDEPENDENT_AMBULATORY_CARE_PROVIDER_SITE_OTHER): Payer: Self-pay

## 2017-03-05 VITALS — BP 152/85 | HR 83 | Ht <= 58 in | Wt 248.0 lb

## 2017-03-05 DIAGNOSIS — R251 Tremor, unspecified: Secondary | ICD-10-CM

## 2017-03-05 DIAGNOSIS — R569 Unspecified convulsions: Secondary | ICD-10-CM

## 2017-03-05 DIAGNOSIS — R269 Unspecified abnormalities of gait and mobility: Secondary | ICD-10-CM | POA: Diagnosis not present

## 2017-03-05 MED ORDER — TOPIRAMATE 100 MG PO TABS: 100.0000 mg | ORAL_TABLET | Freq: Two times a day (BID) | ORAL | 4 refills | Status: DC

## 2017-03-05 MED ORDER — LAMOTRIGINE 100 MG PO TABS: 100.0000 mg | ORAL_TABLET | Freq: Two times a day (BID) | ORAL | 4 refills | Status: DC

## 2017-03-05 NOTE — Progress Notes (Signed)
PATIENT: Pam Sanders DOB: 01/20/1951  Chief Complaint  Patient presents with  . Seizures    She is here with her sister, Pam Sanders.  No seizure activity reported.  . Gait Difficulty    She uses a rolling walker to assist with ambulation.  . Tremors    Feels her medication keeps her tremors under good control.      HISTORICAL  Pam Sanders is a left-handed female, accompanied by her sister Pam Sanders, seen in refer by her primary care doctor [name] from [place] for evaluation of seizure, initial evaluation was on February 20 eighth 2018  I reviewed and summarized referring note, she had a history of hypertension, hypothyroidism, frequent fall, schizophrenia, seizure, she lives in her apartment by herself, her sister spend at least 40 hours or more with her on a weekly basis. She has been on disability for a long time  For her seizure she is taking Topamax 100 mg twice a day, and Depakote ER 500 mg twice a day, last seizure was in 2010.  Since 2016, she was noted to have steady functional decline, she needed her sister to help her change cloth, wiped after using bathroom, she ambulate with a walker, she has gradually worsening bilateral hands left more than right hand shaking, especially when she used her left hand, difficulty holding utensils still, she uses walker to ambulate, now wearing hearing aid,  Laboratory evaluation in 2018, slight decreased TSH 0.43, LDL was 95, elevated triglyceride 236, cholesterol 177, CMP showed elevated creatinine 2.2, glucose 103, BUN 41, normal liver functional tasks, CBC showed mild anemia hemoglobin of 10.7,  Also personally reviewed and compared CAT scan of the brain in 2018 to previous scan in 2014, no acute abnormality, moderate to severe atrophy, 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    UPDATE Sep 02 2016: She is accompanied by her sister Pam Sanders at today's clinical visit, she is doing better with lamotrigine 100 mg twice a day, Topamax 100 mg twice a day, she has less tremor after stopping Depakote   I reviewed CT abdomen pelvis without contrast report: Anterior abdominal wall hernias containing transverse colon, fat, omentum, small hiatal hernia, cholelithiasis, bilateral renal cortical atrophy and renal insufficiency, aortic atherosclerosis without aneurysm, fibroid, nonspecific right ovarian enlargement, she lives in her own apartment, her sister takes care of her 8 hours each day  She is taken off naproxen now, was diagnosed with chronic kidney disease, laboratory evaluation in January 2018, creatinine 1.64 elevated, GFR was 32.   UPDATE Mar 05 2017: She is with her sister Pam Sanders, she is overall doing well, she has home care daily, sister check on her twice a day, she lives alone at her apartment.  She relies on her walker more,   She has not had seizure, taking lamotrigine 100 mg twice a day, Topamax 100 mg twice a day, increase gait abnormality   REVIEW OF SYSTEMS: Full 14 system review of systems performed and notable only for weight gain, shortness of breath, chest pain, leg swelling, back pain, walking difficulty,  ALLERGIES: No Known Allergies  HOME MEDICATIONS: Current Outpatient Medications  Medication Sig Dispense Refill  . alum & mag hydroxide-simeth (MAALOX/MYLANTA) 200-200-20 MG/5ML suspension Take 30 mLs by mouth every 6 (six) hours as needed for indigestion or heartburn.    . Cholecalciferol (VITAMIN D-3) 1000 UNITS CAPS Take 1,000  Units by mouth daily.     . furosemide (LASIX) 40 MG tablet Take 0.5 tablets (20 mg total) by mouth daily. 30 tablet   . lamoTRIgine (LAMICTAL) 100 MG tablet Take 1 tablet (100 mg total) by mouth 2 (two) times daily. 60 tablet 11  . levothyroxine (SYNTHROID, LEVOTHROID) 200 MCG tablet Take 200 mcg by mouth daily before  breakfast.     . losartan (COZAAR) 100 MG tablet Take 1 tablet by mouth daily.    . Multiple Vitamin (MULTIVITAMIN WITH MINERALS) TABS tablet Take 1 tablet by mouth daily.    . Omega-3 Fatty Acids (FISH OIL) 1000 MG CAPS Take 1 capsule by mouth daily.     . potassium chloride SA (K-DUR,KLOR-CON) 20 MEQ tablet Take 1 tablet (20 mEq total) by mouth 2 (two) times daily. 20 tablet 0  . ranitidine (ZANTAC) 150 MG tablet Take 150 mg by mouth at bedtime.    . sertraline (ZOLOFT) 50 MG tablet Take 50 mg by mouth daily.    Marland Kitchen spironolactone (ALDACTONE) 25 MG tablet Take 1 tablet by mouth daily.    Marland Kitchen thioridazine (MELLARIL) 50 MG tablet Take 1 tablet by mouth at bedtime.    . topiramate (TOPAMAX) 100 MG tablet Take 1 tablet (100 mg total) by mouth 2 (two) times daily. 60 tablet 12  . zolpidem (AMBIEN) 5 MG tablet Take 5 mg by mouth at bedtime.      No current facility-administered medications for this visit.     PAST MEDICAL HISTORY: Past Medical History:  Diagnosis Date  .    Marland Kitchen Gait disturbance   . GERD (gastroesophageal reflux disease)   . HA (headache)   . Hypertension   . Other convulsions   . Seizures ([place])   . Thyroid disease   . Tremor     PAST SURGICAL HISTORY: Past Surgical History:  Procedure Laterality Date  . catract    . STOMACH SURGERY      FAMILY HISTORY: Family History  Problem Relation Age of Onset  . Stroke Mother   . Stroke Father   . Stroke Unknown   . Diabetes Unknown   . Heart Problems Unknown     SOCIAL HISTORY:  Social History   Socioeconomic History  . Marital status: Single    Spouse name: Not on file  . Number of children: 0  . Years of education: HS  . Highest education level: Not on file  Social Needs  . Financial resource strain: Not on file  . Food insecurity - worry: Not on file  . Food insecurity - inability: Not on file  . Transportation needs - medical: Not on file  . Transportation needs - non-medical: Not on file  Occupational  History  . Occupation: Disabled  Tobacco Use  . Smoking status: Never Smoker  . Smokeless tobacco: Never Used  Substance and Sexual Activity  . Alcohol use: No  . Drug use: No  . Sexual activity: Not on file  Other Topics Concern  . Not on file  Social History Narrative   Patient lives at home alone. Patient has a high school education. Her sister stays with her 40 hours per week.   Caffeine - one soda daily.     PHYSICAL EXAM   Vitals:   03/05/17 1310  BP: (!) 152/85  Pulse: 83  Weight: 248 lb (112.5 kg)  Height: 4\' 10"  (1.473 m)    Not recorded      Body mass index is 51.83 kg/m.  PHYSICAL EXAMNIATION:  Gen: NAD, conversant, well nourised, obese, well groomed                     Cardiovascular: Regular rate rhythm, no peripheral edema, warm, nontender. Eyes: Conjunctivae clear without exudates or hemorrhage Neck: Supple, no carotid bruits. Pulmonary: Clear to auscultation bilaterally   NEUROLOGICAL EXAM:  MENTAL STATUS: she depend on her sister wants her questions , Following commands  Speech/Cognition::    Speech is normal; fluent and spontaneous with normal comprehension.        CRANIAL NERVES: CN II: Visual fields are full to confrontation. Fundoscopic exam is normal with sharp discs and no vascular changes. Pupils are round equal and briskly reactive to light. CN III, IV, VI: extraocular movement are normal. No ptosis. CN V: Facial sensation is intact to pinprick in all 3 divisions bilaterally. Corneal responses are intact.  CN VII: Face is symmetric with normal eye closure and smile. CN VIII: Hearing is normal to rubbing fingers CN IX, X: Palate elevates symmetrically. Phonation is normal. CN XI: Head turning and shoulder shrug are intact CN XII: Tongue is midline with normal movements and no atrophy.  MOTOR:  she has mild left hand more than right posturing tremor, no significant rigidity, no weakness  REFLEXES: Reflexes are 2+ and symmetric at  the biceps, triceps, knees, and ankles. Plantar responses are flexor.  SENSORY: Intact to light touch, pinprick, positional sensation and vibratory sensation are intact in fingers and toes.  COORDINATION: Rapid alternating movements and fine finger movements are intact. There is no dysmetria on finger-to-nose and heel-knee-shin.    GAIT/STANCE:  she rely on her walker to get up  from seated position, wide base, cautous   DIAGNOSTIC DATA (LABS, IMAGING, TESTING) - I reviewed patient records, labs, notes, testing and imaging myself where available.   ASSESSMENT AND PLAN  Pam Sanders is a 66 y.o. 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 Ph.D.  [facility] [address] [place], [place] [address] Ph: [phone] Fax: [phone]  CC: [name], MD

## 2017-03-09 ENCOUNTER — Telehealth: Payer: Self-pay | Admitting: Neurology

## 2017-03-09 NOTE — Telephone Encounter (Signed)
Pam Sanders with [facility] is calling. Per patient request PT will start tomorrow instead of today. A returned call is not needed.

## 2017-04-15 NOTE — Telephone Encounter (Signed)
Simona Huh with [facility] care called to inform us they are discharging pt because pt has met all goals. If any questions call at [phone]

## 2017-09-03 ENCOUNTER — Ambulatory Visit: Payer: Medicare Other | Admitting: Neurology

## 2018-03-01 ENCOUNTER — Ambulatory Visit: Payer: Medicare Other | Admitting: Neurology

## 2018-05-21 ENCOUNTER — Other Ambulatory Visit: Payer: Self-pay | Admitting: Neurology

## 2018-07-28 IMAGING — DX DG CHEST 2V
2 series · 2 of 2 positions shown · non-contrast
Comparison: None.

CLINICAL DATA: HYPOTENSION, Pt brought to ED by [facility]. Pt fell at
home, found lying on back outside, alert. Pt alert and oriented x 4
upon arrival to ED. EMS reports pt hypotensive on scene.HISTORY OF
DM, HTN, SEIZURES,

EXAM:
CHEST  2 VIEW

[chest lat]
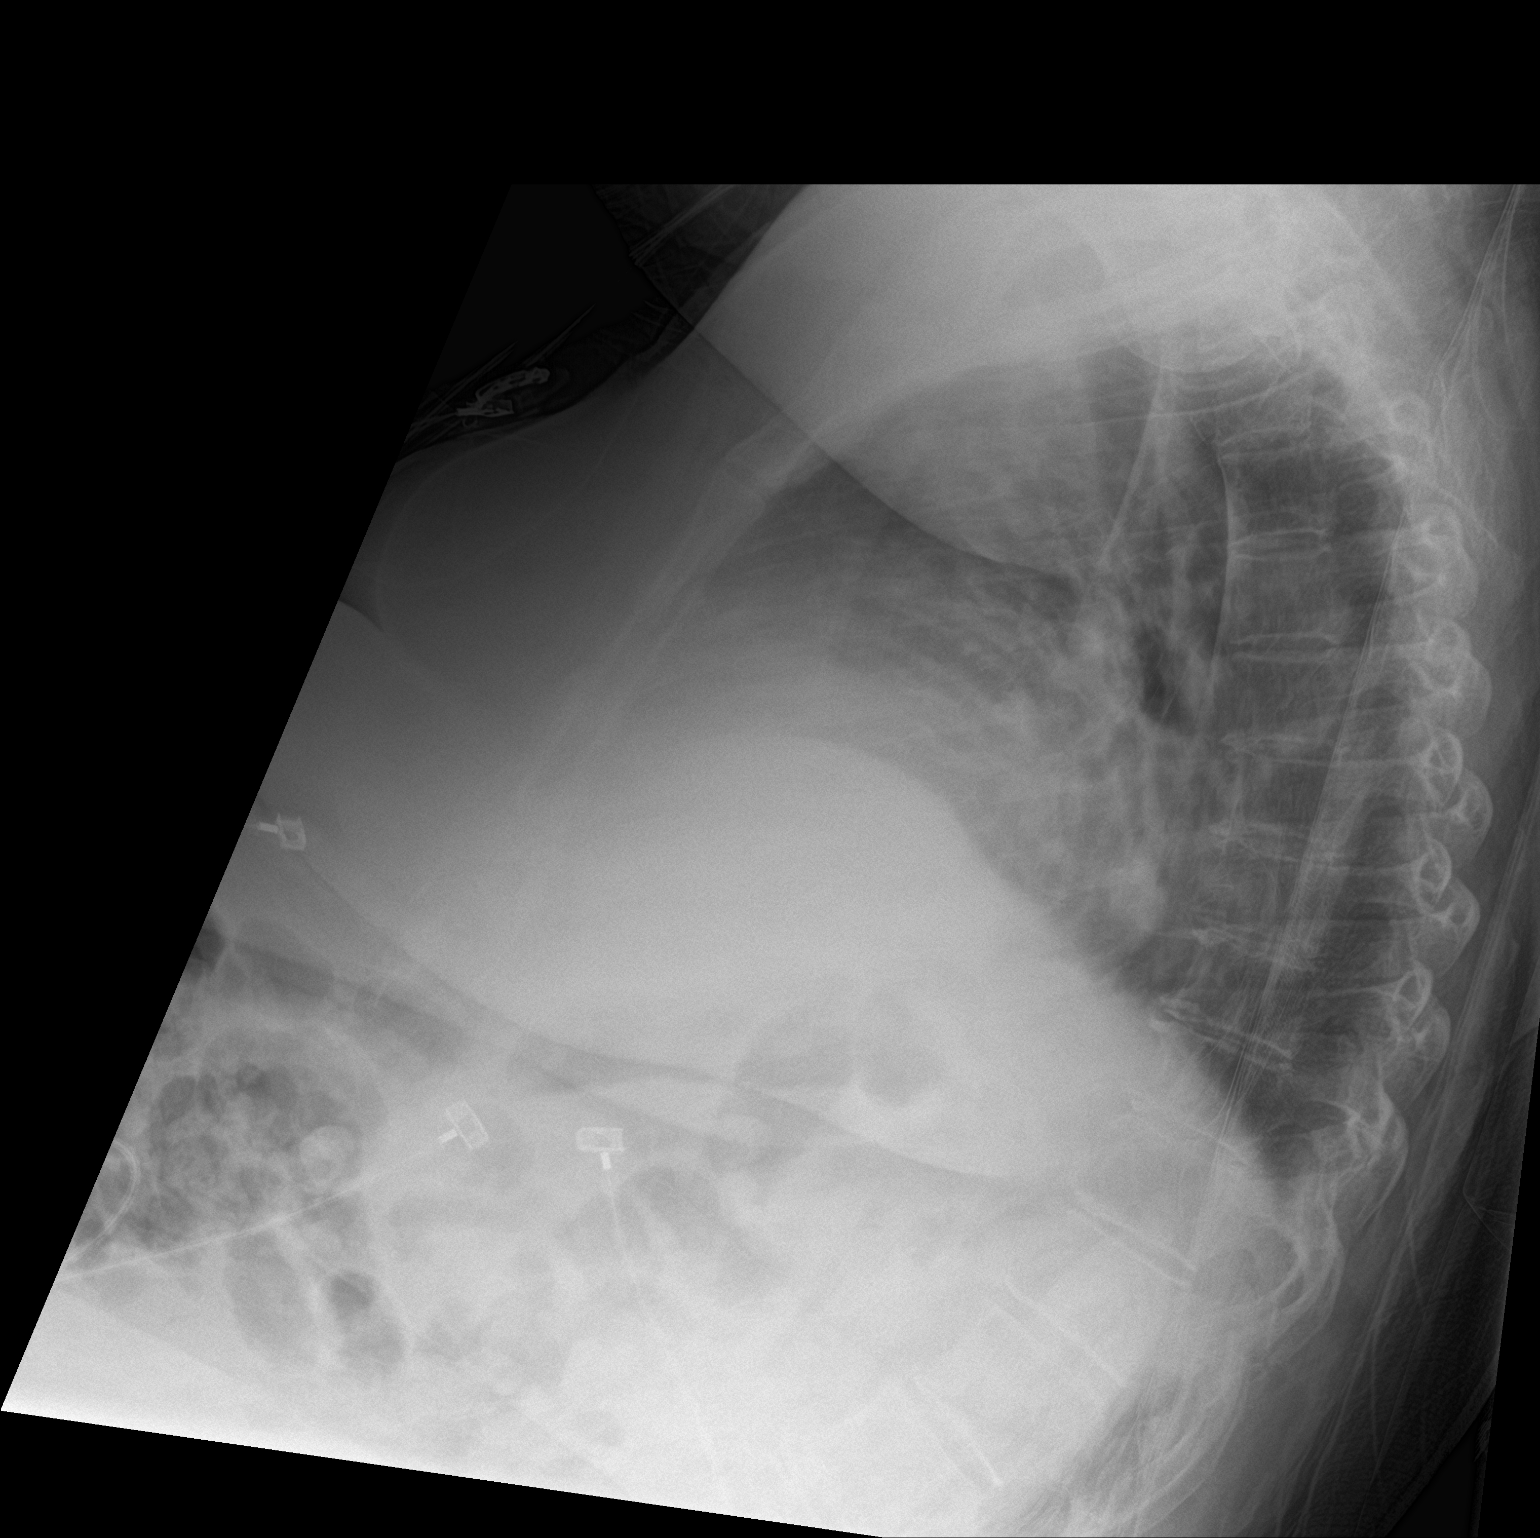

[chest ap]
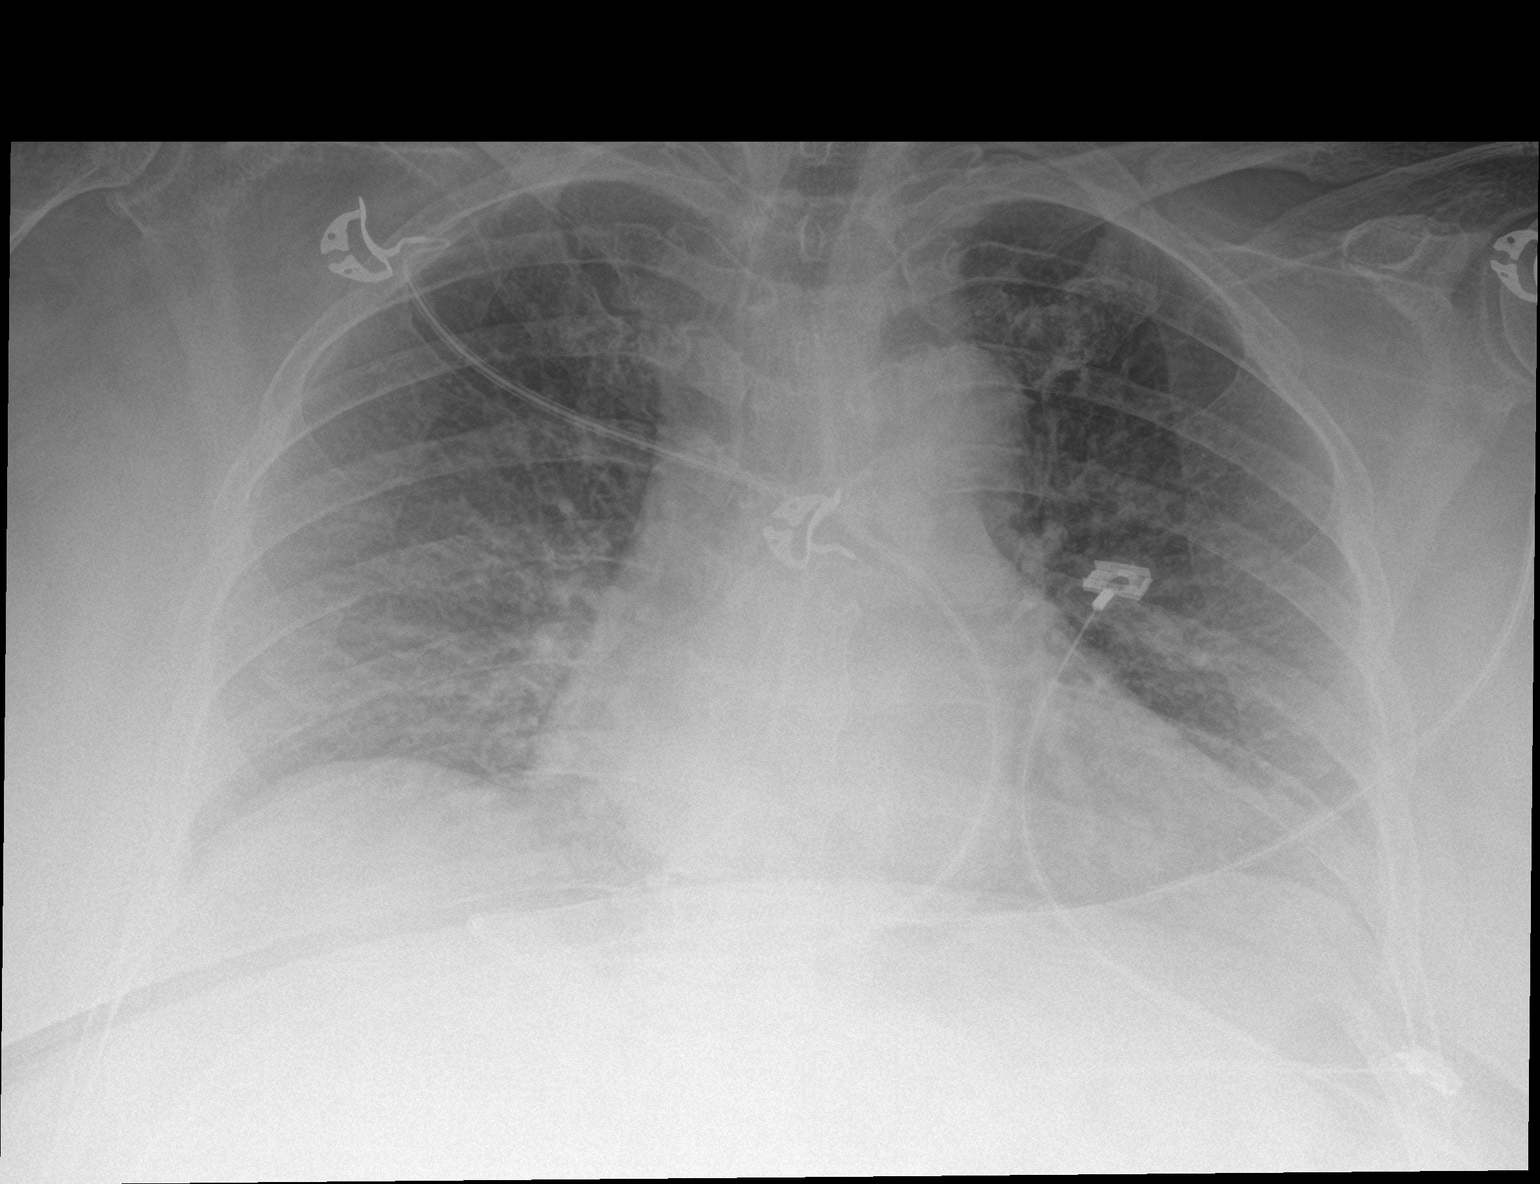

[2 of 2 positions shown; findings below may reference images not displayed]

FINDINGS: Cardiac silhouette is borderline enlarged. No mediastinal or hilar
masses. No convincing adenopathy.

Lung volumes are low with crowding of bronchovascular structures.
Allowing for this, the lungs are clear.

No pleural effusion.  No pneumothorax.

Skeletal structures are demineralized but grossly intact.
IMPRESSION: No acute cardiopulmonary disease.

## 2018-09-30 ENCOUNTER — Other Ambulatory Visit: Payer: Self-pay

## 2018-09-30 ENCOUNTER — Encounter (HOSPITAL_COMMUNITY): Payer: Self-pay | Admitting: *Deleted

## 2018-09-30 ENCOUNTER — Emergency Department (HOSPITAL_COMMUNITY): Payer: Medicare Other

## 2018-09-30 ENCOUNTER — Emergency Department (HOSPITAL_COMMUNITY)
Admission: EM | Admit: 2018-09-30 | Discharge: 2018-09-30 | Disposition: A | Discharge status: Home / Self Care | Payer: Medicare Other | Attending: Emergency Medicine | Admitting: Emergency Medicine

## 2018-09-30 DIAGNOSIS — I129 Hypertensive chronic kidney disease with stage 1 through stage 4 chronic kidney disease, or unspecified chronic kidney disease: Secondary | ICD-10-CM | POA: Diagnosis not present

## 2018-09-30 DIAGNOSIS — E119 Type 2 diabetes mellitus without complications: Secondary | ICD-10-CM | POA: Insufficient documentation

## 2018-09-30 DIAGNOSIS — D649 Anemia, unspecified: Secondary | ICD-10-CM | POA: Diagnosis not present

## 2018-09-30 DIAGNOSIS — Z79899 Other long term (current) drug therapy: Secondary | ICD-10-CM | POA: Diagnosis not present

## 2018-09-30 DIAGNOSIS — R1084 Generalized abdominal pain: Secondary | ICD-10-CM | POA: Insufficient documentation

## 2018-09-30 DIAGNOSIS — N183 Chronic kidney disease, stage 3 (moderate): Secondary | ICD-10-CM | POA: Insufficient documentation

## 2018-09-30 LAB — COMPREHENSIVE METABOLIC PANEL
ALT: 12 U/L (ref 0–44)
AST: 17 U/L (ref 15–41)
Albumin: 3.8 g/dL (ref 3.5–5.0)
Alkaline Phosphatase: 106 U/L (ref 38–126)
Anion gap: 13 (ref 5–15)
BUN: 24 mg/dL — ABNORMAL HIGH (ref 8–23)
CO2: 25 mmol/L (ref 22–32)
Calcium: 9.4 mg/dL (ref 8.9–10.3)
Chloride: 100 mmol/L (ref 98–111)
Creatinine, Ser: 1.66 mg/dL — ABNORMAL HIGH (ref 0.44–1.00)
GFR calc Af Amer: 36 mL/min — ABNORMAL LOW (ref >60)
GFR calc non Af Amer: 31 mL/min — ABNORMAL LOW (ref >60)
Glucose, Bld: 111 mg/dL — ABNORMAL HIGH (ref 70–99)
Potassium: 3.5 mmol/L (ref 3.5–5.1)
Sodium: 138 mmol/L (ref 135–145)
Total Bilirubin: 0.2 mg/dL — ABNORMAL LOW (ref 0.3–1.2)
Total Protein: 7.3 g/dL (ref 6.5–8.1)

## 2018-09-30 LAB — CBC WITH DIFFERENTIAL/PLATELET
Abs Immature Granulocytes: 0.02 10*3/uL (ref 0.00–0.07)
Basophils Absolute: 0 10*3/uL (ref 0.0–0.1)
Basophils Relative: 1 %
Eosinophils Absolute: 0.2 10*3/uL (ref 0.0–0.5)
Eosinophils Relative: 3 %
HCT: 36.5 % (ref 36.0–46.0)
Hemoglobin: 11.9 g/dL — ABNORMAL LOW (ref 12.0–15.0)
Immature Granulocytes: 0 %
Lymphocytes Relative: 25 %
Lymphs Abs: 2.1 10*3/uL (ref 0.7–4.0)
MCH: 31.5 pg (ref 26.0–34.0)
MCHC: 32.6 g/dL (ref 30.0–36.0)
MCV: 96.6 fL (ref 80.0–100.0)
Monocytes Absolute: 1 10*3/uL (ref 0.1–1.0)
Monocytes Relative: 12 %
Neutro Abs: 5.2 10*3/uL (ref 1.7–7.7)
Neutrophils Relative %: 59 %
Platelets: 188 10*3/uL (ref 150–400)
RBC: 3.78 MIL/uL — ABNORMAL LOW (ref 3.87–5.11)
RDW: 13.4 % (ref 11.5–15.5)
WBC: 8.6 10*3/uL (ref 4.0–10.5)
nRBC: 0 % (ref 0.0–0.2)

## 2018-09-30 LAB — URINALYSIS, ROUTINE W REFLEX MICROSCOPIC
Bacteria, UA: NONE SEEN
Bilirubin Urine: NEGATIVE
Glucose, UA: NEGATIVE mg/dL
Ketones, ur: NEGATIVE mg/dL
Leukocytes,Ua: NEGATIVE
Nitrite: NEGATIVE
Protein, ur: NEGATIVE mg/dL
Specific Gravity, Urine: 1.008 (ref 1.005–1.030)
pH: 5 (ref 5.0–8.0)

## 2018-09-30 LAB — RAPID URINE DRUG SCREEN, HOSP PERFORMED
Amphetamines: NOT DETECTED
Barbiturates: NOT DETECTED
Benzodiazepines: NOT DETECTED
Cocaine: NOT DETECTED
Opiates: NOT DETECTED
Tetrahydrocannabinol: NOT DETECTED

## 2018-09-30 LAB — POC OCCULT BLOOD, ED: Fecal Occult Bld: NEGATIVE

## 2018-09-30 LAB — LIPASE, BLOOD: Lipase: 34 U/L (ref 11–51)

## 2018-09-30 MED ORDER — ACETAMINOPHEN 325 MG PO TABS
650.0000 mg | ORAL_TABLET | Freq: Once | ORAL | Status: AC
  Administered 2018-09-30: 650 mg via ORAL
  Filled 2018-09-30: qty 2

## 2018-09-30 MED ORDER — IOHEXOL 300 MG/ML  SOLN
75.0000 mL | Freq: Once | INTRAMUSCULAR | Status: AC | PRN
  Administered 2018-09-30: 75 mL via INTRAVENOUS

## 2018-09-30 NOTE — ED Triage Notes (Signed)
Patient complains of abdominal pain in right lower quadrant with blood in stool for one day.

## 2018-09-30 NOTE — ED Notes (Signed)
Fecal occult negative results

## 2018-09-30 NOTE — ED Provider Notes (Signed)
[facility] EMERGENCY DEPARTMENT Provider Note   CSN: [id number] Arrival date & time: 09/30/18  8144    History   Chief Complaint Chief Complaint  Patient presents with  . Abdominal Pain    HPI Pam Sanders is a 68 y.o. female with PMH significant for HTN, hypothyroidism, seizures, GERD, diabetes, HLD, CKD3, schizophrenia, anemia, obesity here for 1 day h/o generalized abdominal pain with persistent bright red blood in the stool, ongoing for "some time." Endorses some nausea. Unclear if pain is exacerbated by eating but denies issues with tolerating PO. Denies fevers, chest pain, SOB, vomiting, dysuria, recent seizures, cough, congestion, sore throat, constipation or diarrhea. She is not sexually active. She states she has taken tylenol for pain with some relief. Denies ibuprofen or other NSAID use.     Past Medical History:  Diagnosis Date  . Diabetes ([place])   . Gait disturbance   . GERD (gastroesophageal reflux disease)   . HA (headache)   . Hypertension   . Other convulsions   . Seizures ([place])   . Thyroid disease   . Tremor     Patient Active Problem List   Diagnosis Date Noted  . Tremor 06/04/2016  . AKI (acute kidney injury) ([place]) 04/05/2016  . Dehydration 04/05/2016  . Fall 04/05/2016  . Essential hypertension 04/05/2016  . Hypotension 04/05/2016  . Seizures ([place]) 01/05/2013  . Gait disturbance   . Diabetes [facility])     Past Surgical History:  Procedure Laterality Date  . catract    . STOMACH SURGERY       OB History   No obstetric history on file.      Home Medications    Prior to Admission medications   Medication Sig Start Date End Date Taking? Authorizing Provider  alum & mag hydroxide-simeth (MAALOX/MYLANTA) 200-200-20 MG/5ML suspension Take 30 mLs by mouth every 6 (six) hours as needed for indigestion or heartburn.    [provider]  Cholecalciferol (VITAMIN D-3) 1000 UNITS CAPS Take 1,000 Units by mouth daily.     [provider]  furosemide (LASIX) 40 MG tablet Take 0.5 tablets (20 mg total) by mouth daily. 04/07/16   Isaac Bliss, Rayford Halsted, MD  lamoTRIgine (LAMICTAL) 100 MG tablet Take 1 tablet (100 mg total) by mouth 2 (two) times daily. 03/05/17   Marcial Pacas, MD  levothyroxine (SYNTHROID, LEVOTHROID) 200 MCG tablet Take 200 mcg by mouth daily before breakfast.     [provider]  losartan (COZAAR) 100 MG tablet Take 1 tablet by mouth daily. 04/04/16   [provider]  Multiple Vitamin (MULTIVITAMIN WITH MINERALS) TABS tablet Take 1 tablet by mouth daily.    [provider]  Omega-3 Fatty Acids (FISH OIL) 1000 MG CAPS Take 1 capsule by mouth daily.     [provider]  potassium chloride SA (K-DUR,KLOR-CON) 20 MEQ tablet Take 1 tablet (20 mEq total) by mouth 2 (two) times daily. 11/23/54   Delora Fuel, MD  ranitidine (ZANTAC) 150 MG tablet Take 150 mg by mouth at bedtime. 07/17/16 07/17/17  [provider]  sertraline (ZOLOFT) 50 MG tablet Take 50 mg by mouth daily.    [provider]  spironolactone (ALDACTONE) 25 MG tablet Take 1 tablet by mouth daily. 04/04/16   [provider]  thioridazine (MELLARIL) 50 MG tablet Take 1 tablet by mouth at bedtime. 04/04/16   [provider]  topiramate (TOPAMAX) 100 MG tablet Take 1 tablet (100 mg total) by mouth 2 (  two) times daily. 03/05/17   Marcial Pacas, MD  zolpidem (AMBIEN) 5 MG tablet Take 5 mg by mouth at bedtime.     [provider]    Family History Family History  Problem Relation Age of Onset  . Stroke Mother   . Stroke Father   . Stroke Other   . Diabetes Other   . Heart Problems Other     Social History Social History   Tobacco Use  . Smoking status: Never Smoker  . Smokeless tobacco: Never Used  Substance Use Topics  . Alcohol use: No  . Drug use: No     Allergies   Patient has no known allergies.   Review of Systems Review of Systems   Constitutional: Negative for fever.  HENT: Positive for rhinorrhea. Negative for sore throat.   Respiratory: Negative for cough and shortness of breath.   Cardiovascular: Positive for chest pain.  Gastrointestinal: Positive for abdominal pain, blood in stool and nausea. Negative for vomiting.  Genitourinary: Negative for difficulty urinating and dysuria.  Skin: Negative for rash.  Neurological: Negative for seizures.     Physical Exam Updated Vital Signs BP (!) 118/51 (BP Location: Right Arm)   Pulse 61   Temp 98 F (36.7 C) (Oral)   Resp 17   Ht 5\' 2"  (1.575 m)   Wt 105.2 kg   SpO2 100%   BMI 42.43 kg/m   Physical Exam Vitals signs and nursing note reviewed. Exam conducted with a chaperone present.  Constitutional:      General: She is not in acute distress.    Appearance: She is obese. She is not ill-appearing, toxic-appearing or diaphoretic.  HENT:     Head: Normocephalic.     Mouth/Throat:     Mouth: Mucous membranes are moist.     Pharynx: Oropharynx is clear. No pharyngeal swelling or oropharyngeal exudate.  Eyes:     General: No scleral icterus.    Extraocular Movements: Extraocular movements intact.     Pupils: Pupils are equal, round, and reactive to light.  Cardiovascular:     Rate and Rhythm: Normal rate and regular rhythm.     Heart sounds: Normal heart sounds. No murmur.  Pulmonary:     Effort: Pulmonary effort is normal. No respiratory distress.     Breath sounds: Normal breath sounds. No wheezing, rhonchi or rales.  Chest:     Chest wall: No tenderness.  Abdominal:     General: A surgical scar is present.     Palpations: Abdomen is soft.     Tenderness: There is generalized abdominal tenderness. There is no right CVA tenderness, left CVA tenderness, guarding or rebound. Positive signs include Murphy's sign.     Hernia: A hernia is present.  Genitourinary:    Vagina: Normal.     Rectum: Normal. Guaiac result negative. No tenderness.     Comments:  External genitalia with irritation and erythema.  Vaginal mucosa pink, moist, normal rugae.  Unable to visualize cervix. No discharge or bleeding noted on speculum exam.      Skin:    General: Skin is warm.     Findings: No rash.  Neurological:     General: No focal deficit present.     Mental Status: She is oriented to person, place, and time.     ED Treatments / Results  Labs (all labs ordered are listed, but only abnormal results are displayed) Labs Reviewed  COMPREHENSIVE METABOLIC PANEL - Abnormal; Notable for the following  components:      Result Value   Glucose, Bld 111 (*)    BUN 24 (*)    Creatinine, Ser 1.66 (*)    Total Bilirubin 0.2 (*)    GFR calc non Af Amer 31 (*)    GFR calc Af Amer 36 (*)    All other components within normal limits  CBC WITH DIFFERENTIAL/PLATELET - Abnormal; Notable for the following components:   RBC 3.78 (*)    Hemoglobin 11.9 (*)    All other components within normal limits  URINALYSIS, ROUTINE W REFLEX MICROSCOPIC - Abnormal; Notable for the following components:   Hgb urine dipstick MODERATE (*)    All other components within normal limits  RAPID URINE DRUG SCREEN, HOSP PERFORMED  LIPASE, BLOOD    EKG EKG Interpretation  Date/Time:  Thursday September 30 2018 10:15:55 EDT Ventricular Rate:  62 PR Interval:    QRS Duration: 105 QT Interval:  489 QTC Calculation: 497 R Axis:   81 Text Interpretation:  Sinus rhythm Borderline right axis deviation Low voltage, precordial leads Borderline T abnormalities, anterior leads Borderline prolonged QT interval Confirmed by Elnora Morrison [phone]) on 09/30/2018 11:03:43 AM   Radiology Ct Abdomen Pelvis W Contrast  Result Date: 09/30/2018 CLINICAL DATA:  Abdominal pain in RIGHT lower quadrant, blood in stool, symptoms for 1 day, diabetes mellitus, hypertension, GERD EXAM: CT ABDOMEN AND PELVIS WITH CONTRAST TECHNIQUE: Multidetector CT imaging of the abdomen and pelvis was performed using the  standard protocol following bolus administration of intravenous contrast. Sagittal and coronal MPR images reconstructed from axial data set. CONTRAST:  65mL OMNIPAQUE IOHEXOL 300 MG/ML SOLN IV. No oral contrast. COMPARISON:  None FINDINGS: Lower chest: Lung bases clear Hepatobiliary: Gallbladder and liver normal appearance Pancreas: Partial fatty replacement of pancreatic head and uncinate. Otherwise normal appearance Spleen: Normal appearance Adrenals/Urinary Tract: Adrenal glands normal appearance. BILATERAL renal cortical atrophy without mass or hydronephrosis. No ureteral calcification or dilatation. Bladder unremarkable. Stomach/Bowel: Large portion of the transverse colon is seen extending into a large ventral hernia at umbilicus. No evidence of bowel obstruction, bowel wall thickening or bowel dilatation. Stomach and remaining bowel loops normal appearance. Vascular/Lymphatic: Atherosclerotic calcifications aorta without aneurysm. No adenopathy. Reproductive: Atrophic uterus containing multiple nodules likely small leiomyomata up to 2.6 cm diameter. Ovaries unremarkable Other: No free air or free fluid. No acute inflammatory process. Ventral/umbilical hernia as above with fascial defect measuring 8.5 x 9.9 cm. Musculoskeletal: Bones demineralized but grossly intact. Motion artifacts are present at the inferior pelvis. IMPRESSION: Atrophic uterus containing probable small uterine leiomyomata. Large ventral/umbilical hernia with fascial defect 8.5 x 9.9 cm diameter, containing nonobstructed transverse colon. No additional intra-abdominal or intrapelvic abnormalities. Electronically Signed   By: Lavonia Dana M.D.   On: 09/30/2018 13:49    Procedures Procedures (including critical care time)  Medications Ordered in ED Medications  iohexol (OMNIPAQUE) 300 MG/ML solution 75 mL (75 mLs Intravenous Contrast Given 09/30/18 1249)  acetaminophen (TYLENOL) tablet 650 mg (650 mg Oral Given 09/30/18 1426)      Initial Impression / Assessment and Plan / ED Course  I have reviewed the triage vital signs and the nursing notes.  Pertinent labs & imaging results that were available during my care of the patient were reviewed by me and considered in my medical decision making (see chart for details).   68yo F with h/o HTN, hypothyroidism, seizures, GERD, diabetes, HLD, CKD3, schizophrenia, obesity, anemia here for 1 day h/o generalized abdominal pain with report of  ongoing BRBPR. Vitals stable, overall well appearing with tenderness to palpation in RUQ, RLQ, and epigastric areas. Vaginal and rectal exams unrevealing for potential sources of bleeding or infection, guaiac negative. UA negative. CT Abd/Pelvis with known large ventral hernia containing nonobstructed bowel and small uterine leiomyomata, otherwise negative for abnormalities. Hb 11 although stable from prior. Cr stable from prior, CMP otherwise without notable abnormalities. EKG without ST changes. Lipase negative. UA and UDS negative. On reevaluation, vitals stable and patient remains well appearing. Patient stable for discharge with close PCP follow up, recommend outpatient colonoscopy. Patient and family verbalized understanding. Return precautions given.    Final Clinical Impressions(s) / ED Diagnoses   Final diagnoses:  Generalized abdominal pain    ED Discharge Orders    None       Rory Percy, DO 09/30/18 1432    Elnora Morrison, MD 10/02/18 [phone]

## 2018-09-30 NOTE — Discharge Instructions (Signed)
You were seen today for abdominal pain. Your labs and imaging did not show evidence of infection, acute bleed, gallbladder or liver problems. You can take tylenol as needed for pain. Avoid ibuprofen, BC powder or Goody powder. You should follow up with the GI doctor for a colonoscopy. If you develop fever, chest pain, shortness of breath, vomiting or inability to tolerate liquids, come back to the ED to be seen.

## 2018-10-04 ENCOUNTER — Encounter: Payer: Self-pay | Admitting: Gastroenterology

## 2018-10-21 ENCOUNTER — Other Ambulatory Visit: Payer: Self-pay | Admitting: *Deleted

## 2018-10-28 ENCOUNTER — Other Ambulatory Visit: Payer: Self-pay

## 2018-10-28 DIAGNOSIS — IMO0002 Reserved for concepts with insufficient information to code with codable children: Secondary | ICD-10-CM | POA: Insufficient documentation

## 2018-10-28 DIAGNOSIS — H902 Conductive hearing loss, unspecified: Secondary | ICD-10-CM | POA: Insufficient documentation

## 2018-10-28 DIAGNOSIS — J309 Allergic rhinitis, unspecified: Secondary | ICD-10-CM | POA: Insufficient documentation

## 2018-10-28 DIAGNOSIS — F79 Unspecified intellectual disabilities: Secondary | ICD-10-CM | POA: Insufficient documentation
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
## 2018-10-29 ENCOUNTER — Ambulatory Visit (INDEPENDENT_AMBULATORY_CARE_PROVIDER_SITE_OTHER): Payer: Medicare Other | Admitting: Family Medicine

## 2018-10-29 ENCOUNTER — Encounter: Payer: Self-pay | Admitting: Family Medicine

## 2018-10-29 DIAGNOSIS — E039 Hypothyroidism, unspecified: Secondary | ICD-10-CM | POA: Diagnosis not present

## 2018-10-29 DIAGNOSIS — G40401 Other generalized epilepsy and epileptic syndromes, not intractable, with status epilepticus: Secondary | ICD-10-CM

## 2018-10-29 DIAGNOSIS — N183 Chronic kidney disease, stage 3 unspecified: Secondary | ICD-10-CM

## 2018-10-29 DIAGNOSIS — J309 Allergic rhinitis, unspecified: Secondary | ICD-10-CM

## 2018-10-29 DIAGNOSIS — E785 Hyperlipidemia, unspecified: Secondary | ICD-10-CM

## 2018-10-29 DIAGNOSIS — Z7689 Persons encountering health services in other specified circumstances: Secondary | ICD-10-CM

## 2018-10-29 DIAGNOSIS — J029 Acute pharyngitis, unspecified: Secondary | ICD-10-CM

## 2018-10-29 DIAGNOSIS — F258 Other schizoaffective disorders: Secondary | ICD-10-CM

## 2018-10-29 DIAGNOSIS — F259 Schizoaffective disorder, unspecified: Secondary | ICD-10-CM

## 2018-10-29 DIAGNOSIS — Z1239 Encounter for other screening for malignant neoplasm of breast: Secondary | ICD-10-CM

## 2018-10-29 DIAGNOSIS — K219 Gastro-esophageal reflux disease without esophagitis: Secondary | ICD-10-CM | POA: Diagnosis not present

## 2018-10-29 DIAGNOSIS — R251 Tremor, unspecified: Secondary | ICD-10-CM

## 2018-10-29 DIAGNOSIS — R296 Repeated falls: Secondary | ICD-10-CM

## 2018-10-29 DIAGNOSIS — G479 Sleep disorder, unspecified: Secondary | ICD-10-CM

## 2018-10-29 DIAGNOSIS — K439 Ventral hernia without obstruction or gangrene: Secondary | ICD-10-CM

## 2018-10-29 DIAGNOSIS — I10 Essential (primary) hypertension: Secondary | ICD-10-CM

## 2018-10-29 DIAGNOSIS — F79 Unspecified intellectual disabilities: Secondary | ICD-10-CM

## 2018-10-29 DIAGNOSIS — D539 Nutritional anemia, unspecified: Secondary | ICD-10-CM

## 2018-10-29 MED ORDER — FUROSEMIDE 20 MG PO TABS
20.0000 mg | ORAL_TABLET | Freq: Every day | ORAL | 1 refills | Status: DC
Start: 2018-10-29 — End: 2018-10-29

## 2018-10-29 MED ORDER — THIORIDAZINE HCL 50 MG PO TABS: 50.0000 mg | ORAL_TABLET | Freq: Every day | ORAL | 1 refills | Status: DC

## 2018-10-29 MED ORDER — LOSARTAN POTASSIUM 100 MG PO TABS
100.0000 mg | ORAL_TABLET | Freq: Every day | ORAL | 1 refills | Status: DC
Start: 2018-10-29 — End: 2019-04-13

## 2018-10-29 MED ORDER — SPIRONOLACTONE 25 MG PO TABS: 25.0000 mg | ORAL_TABLET | Freq: Every day | ORAL | 1 refills | Status: DC

## 2018-10-29 MED ORDER — FAMOTIDINE 20 MG PO TABS: 20.0000 mg | ORAL_TABLET | Freq: Every day | ORAL | 1 refills | Status: DC

## 2018-10-29 MED ORDER — LEVOTHYROXINE SODIUM 200 MCG PO TABS: 200.0000 ug | ORAL_TABLET | Freq: Every day | ORAL | 1 refills | Status: DC

## 2018-10-29 MED ORDER — FUROSEMIDE 20 MG PO TABS: 20.0000 mg | ORAL_TABLET | Freq: Every day | ORAL | 1 refills | Status: DC

## 2018-10-29 MED ORDER — SERTRALINE HCL 50 MG PO TABS: 50.0000 mg | ORAL_TABLET | Freq: Every day | ORAL | 1 refills | Status: DC

## 2018-10-29 MED ORDER — FLUTICASONE PROPIONATE 50 MCG/ACT NA SUSP: 2.0000 | Freq: Every day | NASAL | 5 refills | Status: DC

## 2018-10-29 MED ORDER — POTASSIUM CHLORIDE CRYS ER 20 MEQ PO TBCR: 20.0000 meq | EXTENDED_RELEASE_TABLET | Freq: Two times a day (BID) | ORAL | 1 refills | Status: DC

## 2018-10-29 NOTE — Patient Instructions (Addendum)
Decrease Ambien to every other day x3 weeks, then stop taking it.    Preventive Care 68 Years and Older, Female Preventive care refers to lifestyle choices and visits with your health care provider that can promote health and wellness. This includes:  A yearly physical exam. This is also called an annual well check.  Regular dental and eye exams.  Immunizations.  Screening for certain conditions.  Healthy lifestyle choices, such as diet and exercise. What can I expect for my preventive care visit? Physical exam Your health care provider will check:  Height and weight. These may be used to calculate body mass index (BMI), which is a measurement that tells if you are at a healthy weight.  Heart rate and blood pressure.  Your skin for abnormal spots. Counseling Your health care provider may ask you questions about:  Alcohol, tobacco, and drug use.  Emotional well-being.  Home and relationship well-being.  Sexual activity.  Eating habits.  History of falls.  Memory and ability to understand (cognition).  Work and work Statistician.  Pregnancy and menstrual history. What immunizations do I need?  Influenza (flu) vaccine  This is recommended every year. Tetanus, diphtheria, and pertussis (Tdap) vaccine  You may need a Td booster every 10 years. Varicella (chickenpox) vaccine  You may need this vaccine if you have not already been vaccinated. Zoster (shingles) vaccine  You may need this after age 21. Pneumococcal conjugate (PCV13) vaccine  One dose is recommended after age 68. Pneumococcal polysaccharide (PPSV23) vaccine  One dose is recommended after age 58. Measles, mumps, and rubella (MMR) vaccine  You may need at least one dose of MMR if you were born in 1957 or later. You may also need a second dose. Meningococcal conjugate (MenACWY) vaccine  You may need this if you have certain conditions. Hepatitis A vaccine  You may need this if you have  certain conditions or if you travel or work in places where you may be exposed to hepatitis A. Hepatitis B vaccine  You may need this if you have certain conditions or if you travel or work in places where you may be exposed to hepatitis B. Haemophilus influenzae type b (Hib) vaccine  You may need this if you have certain conditions. You may receive vaccines as individual doses or as more than one vaccine together in one shot (combination vaccines). Talk with your health care provider about the risks and benefits of combination vaccines. What tests do I need? Blood tests  Lipid and cholesterol levels. These may be checked every 5 years, or more frequently depending on your overall health.  Hepatitis C test.  Hepatitis B test. Screening  Lung cancer screening. You may have this screening every year starting at age 68 if you have a 30-pack-year history of smoking and currently smoke or have quit within the past 15 years.  Colorectal cancer screening. All adults should have this screening starting at age 68 and continuing until age 36. Your health care provider may recommend screening at age 69 if you are at increased risk. You will have tests every 1-10 years, depending on your results and the type of screening test.  Diabetes screening. This is done by checking your blood sugar (glucose) after you have not eaten for a while (fasting). You may have this done every 1-3 years.  Mammogram. This may be done every 1-2 years. Talk with your health care provider about how often you should have regular mammograms.  BRCA-related cancer screening. This may  be done if you have a family history of breast, ovarian, tubal, or peritoneal cancers. Other tests  Sexually transmitted disease (STD) testing.  Bone density scan. This is done to screen for osteoporosis. You may have this done starting at age 68. Follow these instructions at home: Eating and drinking  Eat a diet that includes fresh fruits  and vegetables, whole grains, lean protein, and low-fat dairy products. Limit your intake of foods with high amounts of sugar, saturated fats, and salt.  Take vitamin and mineral supplements as recommended by your health care provider.  Do not drink alcohol if your health care provider tells you not to drink.  If you drink alcohol: ? Limit how much you have to 0-1 drink a day. ? Be aware of how much alcohol is in your drink. In the [place] one drink equals one 12 oz bottle of beer (355 mL), one 5 oz glass of wine (148 mL), or one 1 oz glass of hard liquor (44 mL). Lifestyle  Take daily care of your teeth and gums.  Stay active. Exercise for at least 30 minutes on 5 or more days each week.  Do not use any products that contain nicotine or tobacco, such as cigarettes, e-cigarettes, and chewing tobacco. If you need help quitting, ask your health care provider.  If you are sexually active, practice safe sex. Use a condom or other form of protection in order to prevent STIs (sexually transmitted infections).  Talk with your health care provider about taking a low-dose aspirin or statin. What's next?  Go to your health care provider once a year for a well check visit.  Ask your health care provider how often you should have your eyes and teeth checked.  Stay up to date on all vaccines. This information is not intended to replace advice given to you by your health care provider. Make sure you discuss any questions you have with your health care provider. Document Released: 04/20/2015 Document Revised: 03/18/2018 Document Reviewed: 03/18/2018 Elsevier Patient Education  2020 [employer].

## 2018-11-03 ENCOUNTER — Encounter: Payer: Self-pay | Admitting: *Deleted

## 2018-11-03 ENCOUNTER — Ambulatory Visit (INDEPENDENT_AMBULATORY_CARE_PROVIDER_SITE_OTHER): Payer: Medicare Other | Admitting: Nurse Practitioner

## 2018-11-03 ENCOUNTER — Other Ambulatory Visit: Payer: Self-pay | Admitting: *Deleted

## 2018-11-03 ENCOUNTER — Encounter: Payer: Self-pay | Admitting: Nurse Practitioner

## 2018-11-03 ENCOUNTER — Other Ambulatory Visit: Payer: Self-pay

## 2018-11-03 VITALS — BP 112/61 | HR 71 | Temp 97.1°F | Ht 62.0 in | Wt 228.4 lb

## 2018-11-03 DIAGNOSIS — K5900 Constipation, unspecified: Secondary | ICD-10-CM

## 2018-11-03 DIAGNOSIS — R109 Unspecified abdominal pain: Secondary | ICD-10-CM | POA: Insufficient documentation

## 2018-11-03 DIAGNOSIS — K625 Hemorrhage of anus and rectum: Secondary | ICD-10-CM

## 2018-11-03 DIAGNOSIS — R1010 Upper abdominal pain, unspecified: Secondary | ICD-10-CM

## 2018-11-03 DIAGNOSIS — K219 Gastro-esophageal reflux disease without esophagitis: Secondary | ICD-10-CM | POA: Diagnosis not present

## 2018-11-03 MED ORDER — OMEPRAZOLE 20 MG PO CPDR: 20.0000 mg | DELAYED_RELEASE_CAPSULE | Freq: Two times a day (BID) | ORAL | 3 refills | Status: DC

## 2018-11-03 MED ORDER — CLENPIQ 10-3.5-12 MG-GM -GM/160ML PO SOLN: 1.0000 | Freq: Once | ORAL | 0 refills | Status: AC

## 2018-11-03 MED ORDER — LINACLOTIDE 72 MCG PO CAPS: 72.0000 ug | ORAL_CAPSULE | Freq: Every day | ORAL | 3 refills | Status: DC

## 2018-11-03 NOTE — Assessment & Plan Note (Signed)
Burning.  She is not very descriptive in her symptoms.  She is currently on Pepcid.  At this point I will have her start Prilosec 20 mg twice daily.  I will send a prescription.  Follow-up in 2 months.

## 2018-11-03 NOTE — Progress Notes (Signed)
Primary Care Physician:  Baruch Gouty, FNP Primary Gastroenterologist:  Dr. Oneida Alar  Chief Complaint  Patient presents with  . Abdominal Pain  . Constipation  . Nausea  . Gastroesophageal Reflux    HPI:   Pam Sanders is a 68 y.o. female who presents on referral from the emergency department for abdominal pain.  The patient was seen in emergency department 09/30/2018.  She presented complaining of a one-day history of generalized abdominal pain with persistent bright red blood in the stool that have been ongoing for quite some time."  Some nausea as well.  Unsure if eating exacerbates her symptoms but denies any issues tolerating oral intake.  Denies NSAIDs.  Physical exam a noted generalized abdominal tenderness with a positive Murphy sign and a hernia.  Heme stool card was negative.  Vitals are stable.  CT abdomen/pelvis with known large ventral hernia containing nonobstructed bowel and small uterine leiomyomata, otherwise negative.  Hemoglobin mildly decreased at 11.9 although stable compared to baseline (10.0-11.8 over the past 2 years).  Recommended primary care follow-up and consideration of outpatient colonoscopy.  No history of colonoscopy in our system.  Today she states she's doing ok overall. She is accompanied by her sister who helps her understand questions and answer appropriately. Her sister said she's had a colonoscopy about 7 years ago at [facility] (now [facility]). She's seen a Psychologist, sport and exercise at [facility] who declined to operate on her hernia due to weight and "kidney problem." Per eGFR she has CKD stage 3. She has had upper abdominal pain for several months. Unable to describe the pain. Hurts daily. She has a known large hernia. Has GERD symptoms regularly, including esophageal burning. Currently on Pepcid. States she cannot have a bowel movement despite stool softeners. Hard stools and straining. Has nausea, but no vomiting. Denies melena, fever, chills,  unintentional weight loss. Denies URI or flu-like symptoms. Denies loss of sense of taste or smell. Denies chest pain, dyspnea, dizziness, lightheadedness, syncope, near syncope. Denies any other upper or lower GI symptoms.  Past Medical History:  Diagnosis Date  . Diabetes ([place])    Resolved  . Gait disturbance   . GERD (gastroesophageal reflux disease)   . HA (headache)   . Hernia of abdominal wall   . Hypertension   . Other convulsions   . Seizures ([place])   . Thyroid disease   . Tremor     Past Surgical History:  Procedure Laterality Date  . CATARACT EXTRACTION, BILATERAL    . STOMACH SURGERY      Current Outpatient Medications  Medication Sig Dispense Refill  . alum & mag hydroxide-simeth (MAALOX/MYLANTA) 200-200-20 MG/5ML suspension Take 30 mLs by mouth every 6 (six) hours as needed for indigestion or heartburn.    . Cholecalciferol (VITAMIN D-3) 1000 UNITS CAPS Take 1,000 Units by mouth daily.     . famotidine (PEPCID) 20 MG tablet Take 1 tablet (20 mg total) by mouth at bedtime. 90 tablet 1  . fluticasone (FLONASE) 50 MCG/ACT nasal spray Place 2 sprays into both nostrils at bedtime. 16 g 5  . furosemide (LASIX) 20 MG tablet Take 1 tablet (20 mg total) by mouth daily. 90 tablet 1  . lamoTRIgine (LAMICTAL) 100 MG tablet Take 1 tablet (100 mg total) by mouth 2 (two) times daily. 180 tablet 4  . levothyroxine (SYNTHROID) 200 MCG tablet Take 1 tablet (200 mcg total) by mouth daily before breakfast. 90 tablet 1  . losartan (COZAAR) 100 MG tablet  Take 1 tablet (100 mg total) by mouth daily. 90 tablet 1  . Melatonin 5 MG CAPS Take 5 mg by mouth at bedtime.    . Multiple Vitamin (MULTIVITAMIN WITH MINERALS) TABS tablet Take 1 tablet by mouth daily.    . Omega-3 Fatty Acids (FISH OIL) 1000 MG CAPS Take 1 capsule by mouth daily.     . potassium chloride SA (K-DUR) 20 MEQ tablet Take 1 tablet (20 mEq total) by mouth 2 (two) times daily. 180 tablet 1  . sertraline (ZOLOFT) 50 MG tablet  Take 1 tablet (50 mg total) by mouth daily. 90 tablet 1  . spironolactone (ALDACTONE) 25 MG tablet Take 1 tablet (25 mg total) by mouth daily. 90 tablet 1  . thioridazine (MELLARIL) 50 MG tablet Take 1 tablet (50 mg total) by mouth at bedtime. 90 tablet 1  . topiramate (TOPAMAX) 100 MG tablet Take 1 tablet (100 mg total) by mouth 2 (two) times daily. 180 tablet 4  . zolpidem (AMBIEN) 5 MG tablet Take 5 mg by mouth at bedtime.    Marland Kitchen linaclotide (LINZESS) 72 MCG capsule Take 1 capsule (72 mcg total) by mouth daily before breakfast. 30 capsule 3  . omeprazole (PRILOSEC) 20 MG capsule Take 1 capsule (20 mg total) by mouth 2 (two) times daily before a meal. 60 capsule 3   No current facility-administered medications for this visit.     Allergies as of 11/03/2018  . (No Known Allergies)    Family History  Problem Relation Age of Onset  . Stroke Mother   . Stroke Father   . Stroke Other   . Diabetes Other   . Heart Problems Other   . Thyroid cancer Sister   . Hypertension Sister   . Emphysema Brother   . Other Brother        died at birth  . Aneurysm Brother        stomach  . Throat cancer Brother   . Emphysema Brother   . Heart disease Brother   . Stroke Brother   . Other Brother        MVA  . Other Sister        died at birth  . Aneurysm Sister        head    Social History   Socioeconomic History  . Marital status: Single    Spouse name: Not on file  . Number of children: 0  . Years of education: HS  . Highest education level: Not on file  Occupational History  . Occupation: Disabled  Social Needs  . Financial resource strain: Not on file  . Food insecurity    Worry: Not on file    Inability: Not on file  . Transportation needs    Medical: Not on file    Non-medical: Not on file  Tobacco Use  . Smoking status: Never Smoker  . Smokeless tobacco: Never Used  Substance and Sexual Activity  . Alcohol use: No  . Drug use: No  . Sexual activity: Not on file   Lifestyle  . Physical activity    Days per week: Not on file    Minutes per session: Not on file  . Stress: Not on file  Relationships  . Social Herbalist on phone: Not on file    Gets together: Not on file    Attends religious service: Not on file    Active member of club or organization: Not on file  Attends meetings of clubs or organizations: Not on file    Relationship status: Not on file  . Intimate partner violence    Fear of current or ex partner: Not on file    Emotionally abused: Not on file    Physically abused: Not on file    Forced sexual activity: Not on file  Other Topics Concern  . Not on file  Social History Narrative   Patient lives at home alone. Patient has a high school education. Her sister stays with her 40 hours per week.   Caffeine - one soda daily.    Review of Systems: Complete ROS negative except as per HPI.    Physical Exam: BP 112/61   Pulse 71   Temp (!) 97.1 F (36.2 C) (Temporal)   Ht _0  (1.575 m)   Wt 228 lb 6.4 oz (103.6 kg)   BMI 41.77 kg/m  General:   Alert and oriented. Pleasant and cooperative. Well-nourished and well-developed.  Head:  Normocephalic and atraumatic. Eyes:  Without icterus, sclera clear and conjunctiva pink.  Ears:  Normal auditory acuity. Cardiovascular:  S1, S2 present without murmurs appreciated. Extremities without clubbing or edema. Respiratory:  Clear to auscultation bilaterally. No wheezes, rales, or rhonchi. No distress.  Gastrointestinal:  +BS, soft, and non-distended. Mild to moderate upper abdominal TTP noted. Lage ventral hernia noted soft and reducible. No HSM noted. No guarding or rebound. No masses appreciated.  Rectal:  Deferred  Musculoskalatal:  Symmetrical without gross deformities. Skin:  Intact without significant lesions or rashes. Neurologic:  Alert and oriented x4;  grossly normal neurologically. Psych:  Alert and cooperative. Normal mood and affect. Heme/Lymph/Immune: No  excessive bruising noted.    11/03/2018 4:36 PM   Disclaimer: This note was dictated with voice recognition software. Similar sounding words can inadvertently be transcribed and may not be corrected upon review.

## 2018-11-03 NOTE — Patient Instructions (Signed)
Your health issues we discussed today were:   Constipation: 1. I have sent a prescription for Linzess 72 mcg to your pharmacy.  Take this once a day, on an empty stomach 2. Call us in 2 to 3 weeks and let us know if it is helping your constipation 3. As we discussed, some patients get diarrhea with this medication.  It should resolve in a few days if it happens.  If it is intolerable does not resolve call us and let us know 4. Call us for any worsening or severe symptoms.  GERD (reflux/heartburn): 1. I have sent in a medication called omeprazole 20 mg.  Take this twice a day, about 30 minutes before eating breakfast and dinner. 2. Call us if you have any worsening or severe symptoms  Rectal bleeding: 1. We will request your previous colonoscopy report from [facility] 2. We will schedule a colonoscopy 3. Further recommendations will be made after your colonoscopy 4. Call us if you have any worsening or severe bleeding  Abdominal pain: 1. As we discussed there are multiple things that could be contributing to your abdominal pain including reflux, constipation, your hernia. 2. We will try to get your constipation and reflux under better control.  At that point if you are still having abdominal pain we can discuss further options for evaluation and treatment 3. Call us for any severe or worsening symptoms  Overall I recommend:  1. Continue your other current medications 2. Follow-up in 2 months 3. Call us if you have any questions or concerns.   Because of recent events of COVID-19 ("Coronavirus"), follow CDC recommendations:  1. Wash your hand frequently 2. Avoid touching your face 3. Stay away from people who are sick 4. If you have symptoms such as fever, cough, shortness of breath then call your healthcare provider for further guidance 5. If you are sick, STAY AT HOME unless otherwise directed by your healthcare provider. 6. Follow directions from state and national  officials regarding staying safe   At [facility] we value your feedback. You may receive a survey about your visit today. Please share your experience as we strive to create trusting relationships with our patients to provide genuine, compassionate, quality care.  We appreciate your understanding and patience as we review any laboratory studies, imaging, and other diagnostic tests that are ordered as we care for you. Our office policy is 5 business days for review of these results, and any emergent or urgent results are addressed in a timely manner for your best interest. If you do not hear from our office in 1 week, please contact us.   We also encourage the use of MyChart, which contains your medical information for your review as well. If you are not enrolled in this feature, an access code is on this after visit summary for your convenience. Thank you for allowing [place] to be involved in your care.  It was great to see you today!  I hope you have a great summer!!

## 2018-11-03 NOTE — Assessment & Plan Note (Signed)
The patient describes constipation.  She states she does not have a bowel movement.  She is somewhat confused and her sister helps.  States she does have significant constipation.  She has had rectal bleeding as well, as per above.  She is tried stool softeners without effect.  This point I will send in Linzess 72 mg once a day on an empty stomach.  Request progress report in 2 to 3 weeks.  Further recommendations to follow based on clinical response.  Follow-up in 2 months.

## 2018-11-03 NOTE — Assessment & Plan Note (Addendum)
The patient describes rectal bleeding which is confirmed by her CNA who assists her at home.  In the ER her stool was heme-negative.  Her hemoglobin was mildly low but within her baseline.  She had a colonoscopy about 7 years ago.  We will request this from [facility] (not [facility]).  At this point we will proceed with a colonoscopy.  Of note she has a large ventral hernia containing a significant portion of the transverse colon.  Discussed this with Dr. Oneida Alar who feels it will not be an issue due to counterpressure provided by [employer].  Further recommendations to follow procedure.  Follow-up in 2 months.  Proceed with colonoscopy on propofol/MAC with Dr. Oneida Alar in the near future. The risks, benefits, and alternatives have been discussed in detail with the patient. They state understanding and desire to proceed.   The patient is currently on Zoloft, Ambien.  No other anticoagulants, anxiolytics, chronic pain medications, or antidepressants.  No diabetes medications or iron supplements.  We will plan for the procedure on propofol/MAC for adequate sedation.

## 2018-11-03 NOTE — Assessment & Plan Note (Signed)
Patient describes upper abdominal pain.  She does have GERD and constipation which both could be contributing to her discomfort.  She also has a large known ventral hernia which could also be contributing to her abdominal discomfort.  At this point like to better get her constipation and GERD under control.  Once he is well managed we can evaluate her pain at that time.  Consider referral to the second opinion surgeon to consider ventral hernia repair if her symptoms persist.  Follow-up in 2 months.

## 2018-11-04 ENCOUNTER — Telehealth: Payer: Self-pay | Admitting: *Deleted

## 2018-11-04 ENCOUNTER — Encounter: Payer: Self-pay | Admitting: Gastroenterology

## 2018-11-04 NOTE — Progress Notes (Signed)
cc'ed to pcp °

## 2018-11-04 NOTE — Telephone Encounter (Signed)
Pre-op scheduled for 9/15 at 1:15pm. Called sister alice-LMOVM. Letter mailed with appt information.

## 2018-11-11 ENCOUNTER — Ambulatory Visit: Payer: Medicare Other | Admitting: Family Medicine

## 2018-11-30 ENCOUNTER — Other Ambulatory Visit: Payer: Medicare Other

## 2018-11-30 ENCOUNTER — Other Ambulatory Visit: Payer: Self-pay

## 2018-11-30 DIAGNOSIS — R251 Tremor, unspecified: Secondary | ICD-10-CM

## 2018-11-30 DIAGNOSIS — N183 Chronic kidney disease, stage 3 unspecified: Secondary | ICD-10-CM

## 2018-11-30 DIAGNOSIS — E039 Hypothyroidism, unspecified: Secondary | ICD-10-CM

## 2018-11-30 DIAGNOSIS — G40401 Other generalized epilepsy and epileptic syndromes, not intractable, with status epilepticus: Secondary | ICD-10-CM

## 2018-11-30 DIAGNOSIS — E785 Hyperlipidemia, unspecified: Secondary | ICD-10-CM

## 2018-11-30 DIAGNOSIS — K219 Gastro-esophageal reflux disease without esophagitis: Secondary | ICD-10-CM

## 2018-11-30 DIAGNOSIS — D539 Nutritional anemia, unspecified: Secondary | ICD-10-CM

## 2018-11-30 DIAGNOSIS — I10 Essential (primary) hypertension: Secondary | ICD-10-CM

## 2018-11-30 DIAGNOSIS — F259 Schizoaffective disorder, unspecified: Secondary | ICD-10-CM

## 2018-12-01 ENCOUNTER — Other Ambulatory Visit: Payer: Self-pay | Admitting: Family Medicine

## 2018-12-01 DIAGNOSIS — E039 Hypothyroidism, unspecified: Secondary | ICD-10-CM

## 2018-12-01 LAB — CBC WITH DIFFERENTIAL/PLATELET
Basophils Absolute: 0 10*3/uL (ref 0.0–0.2)
Basos: 0 %
EOS (ABSOLUTE): 0.1 10*3/uL (ref 0.0–0.4)
Eos: 2 %
Hematocrit: 36.5 % (ref 34.0–46.6)
Hemoglobin: 12.1 g/dL (ref 11.1–15.9)
Immature Grans (Abs): 0 10*3/uL (ref 0.0–0.1)
Immature Granulocytes: 0 %
Lymphocytes Absolute: 1.7 10*3/uL (ref 0.7–3.1)
Lymphs: 21 %
MCH: 30.9 pg (ref 26.6–33.0)
MCHC: 33.2 g/dL (ref 31.5–35.7)
MCV: 93 fL (ref 79–97)
Monocytes Absolute: 0.7 10*3/uL (ref 0.1–0.9)
Monocytes: 9 %
Neutrophils Absolute: 5.2 10*3/uL (ref 1.4–7.0)
Neutrophils: 68 %
Platelets: 216 10*3/uL (ref 150–450)
RBC: 3.91 x10E6/uL (ref 3.77–5.28)
RDW: 13.5 % (ref 11.7–15.4)
WBC: 7.7 10*3/uL (ref 3.4–10.8)

## 2018-12-01 LAB — CMP14+EGFR
ALT: 6 IU/L (ref 0–32)
AST: 13 IU/L (ref 0–40)
Albumin/Globulin Ratio: 1.4 (ref 1.2–2.2)
Albumin: 4.2 g/dL (ref 3.8–4.8)
Alkaline Phosphatase: 122 IU/L — ABNORMAL HIGH (ref 39–117)
BUN/Creatinine Ratio: 16 (ref 12–28)
BUN: 23 mg/dL (ref 8–27)
Bilirubin Total: 0.2 mg/dL (ref 0.0–1.2)
CO2: 21 mmol/L (ref 20–29)
Calcium: 9.6 mg/dL (ref 8.7–10.3)
Chloride: 106 mmol/L (ref 96–106)
Creatinine, Ser: 1.48 mg/dL — ABNORMAL HIGH (ref 0.57–1.00)
GFR calc Af Amer: 42 mL/min/{1.73_m2} — ABNORMAL LOW (ref >59)
GFR calc non Af Amer: 36 mL/min/{1.73_m2} — ABNORMAL LOW (ref >59)
Globulin, Total: 3 g/dL (ref 1.5–4.5)
Glucose: 90 mg/dL (ref 65–99)
Potassium: 4.5 mmol/L (ref 3.5–5.2)
Sodium: 143 mmol/L (ref 134–144)
Total Protein: 7.2 g/dL (ref 6.0–8.5)

## 2018-12-01 LAB — LIPID PANEL
Chol/HDL Ratio: 4.9 ratio — ABNORMAL HIGH (ref 0.0–4.4)
Cholesterol, Total: 181 mg/dL (ref 100–199)
HDL: 37 mg/dL — ABNORMAL LOW (ref >39)
LDL Calculated: 113 mg/dL — ABNORMAL HIGH (ref 0–99)
Triglycerides: 153 mg/dL — ABNORMAL HIGH (ref 0–149)
VLDL Cholesterol Cal: 31 mg/dL (ref 5–40)

## 2018-12-01 LAB — TSH: TSH: 0.143 u[IU]/mL — ABNORMAL LOW (ref 0.450–4.500)

## 2018-12-01 MED ORDER — LEVOTHYROXINE SODIUM 175 MCG PO TABS: 175.0000 ug | ORAL_TABLET | Freq: Every day | ORAL | 2 refills | Status: DC

## 2018-12-02 ENCOUNTER — Other Ambulatory Visit: Payer: Self-pay | Admitting: Family Medicine

## 2018-12-02 DIAGNOSIS — E785 Hyperlipidemia, unspecified: Secondary | ICD-10-CM

## 2018-12-02 MED ORDER — ATORVASTATIN CALCIUM 10 MG PO TABS: 10.0000 mg | ORAL_TABLET | Freq: Every day | ORAL | 2 refills | Status: DC

## 2018-12-06 ENCOUNTER — Telehealth: Payer: Self-pay | Admitting: Neurology

## 2018-12-06 NOTE — Telephone Encounter (Signed)
I spoke to her sister, Mervyn Skeeters, on [place].  The patient was last seen 03/05/2017.  The lamotrigine prescription has not been sent in for some time.  Her sister is working with her to get her medications sorted.  She stated she is going to first discuss this medication being managed by her PCP.  If the PCP is not in agreement, then she will call back to schedule an appt to be seen here.   For seizure follow up, she may see either Dr. Krista Blue or Butler Denmark, NP.

## 2018-12-06 NOTE — Telephone Encounter (Signed)
Pt's sister called stating that when she called the pharmacy to get her sister's refill they informed her that she had to call us to request the refill. Pt is needing a refill on her lamoTRIgine (LAMICTAL) 100 MG tablet sent to the [facility]. Please advise.

## 2018-12-07 ENCOUNTER — Other Ambulatory Visit: Payer: Self-pay | Admitting: *Deleted

## 2018-12-07 MED ORDER — LAMOTRIGINE 100 MG PO TABS: 100.0000 mg | ORAL_TABLET | Freq: Two times a day (BID) | ORAL | 1 refills | Status: DC

## 2018-12-20 ENCOUNTER — Encounter (HOSPITAL_COMMUNITY): Payer: Self-pay

## 2018-12-20 ENCOUNTER — Telehealth: Payer: Self-pay | Admitting: *Deleted

## 2018-12-20 NOTE — Telephone Encounter (Signed)
Received a call from patient sister. They want to cancel procedure for now and will call back to r/s at a later date. Called endo and cancelled procedure. FYI to EG

## 2018-12-20 NOTE — Patient Instructions (Signed)
   Your procedure is scheduled on: 12/23/2018  Report to [facility] at     12:15 PM.  Call this number if you have problems the morning of surgery: [phone]   Remember:              Follow Directions on the letter you received from Your Physician's office regarding the Bowel Prep  :  Take these medicines the morning of surgery with A SIP OF WATER: Levothyroxine, pepcid, Flonase, lamictal, losartan, omeprazole and zoloft   Do not wear jewelry, make-up or nail polish.    Do not bring valuables to the hospital.  Contacts, dentures or bridgework may not be worn into surgery.  .   Patients discharged the day of surgery will not be allowed to drive home.     Colonoscopy, Adult, Care After This sheet gives you information about how to care for yourself after your procedure. Your health care provider may also give you more specific instructions. If you have problems or questions, contact your health care provider. What can I expect after the procedure? After the procedure, it is common to have:  A small amount of blood in your stool for 24 hours after the procedure.  Some gas.  Mild abdominal cramping or bloating.  Follow these instructions at home: General instructions   For the first 24 hours after the procedure: ? Do not drive or use machinery. ? Do not sign important documents. ? Do not drink alcohol. ? Do your regular daily activities at a slower pace than normal. ? Eat soft, easy-to-digest foods. ? Rest often.  Take over-the-counter or prescription medicines only as told by your health care provider.  It is up to you to get the results of your procedure. Ask your health care provider, or the department performing the procedure, when your results will be ready. Relieving cramping and bloating  Try walking around when you have cramps or feel bloated.  Apply heat to your abdomen as told by your health care provider. Use a heat source that your health care provider  recommends, such as a moist heat pack or a heating pad. ? Place a towel between your skin and the heat source. ? Leave the heat on for 20-30 minutes. ? Remove the heat if your skin turns bright red. This is especially important if you are unable to feel pain, heat, or cold. You may have a greater risk of getting burned. Eating and drinking  Drink enough fluid to keep your urine clear or pale yellow.  Resume your normal diet as instructed by your health care provider. Avoid heavy or fried foods that are hard to digest.  Avoid drinking alcohol for as long as instructed by your health care provider. Contact a health care provider if:  You have blood in your stool 2-3 days after the procedure. Get help right away if:  You have more than a small spotting of blood in your stool.  You pass large blood clots in your stool.  Your abdomen is swollen.  You have nausea or vomiting.  You have a fever.  You have increasing abdominal pain that is not relieved with medicine. This information is not intended to replace advice given to you by your health care provider. Make sure you discuss any questions you have with your health care provider. Document Released: 11/06/2003 Document Revised: 12/17/2015 Document Reviewed: 06/05/2015 Elsevier Interactive Patient Education  [employer].

## 2018-12-21 ENCOUNTER — Other Ambulatory Visit: Payer: Self-pay

## 2018-12-21 ENCOUNTER — Encounter (HOSPITAL_COMMUNITY): Payer: Self-pay

## 2018-12-21 ENCOUNTER — Encounter (HOSPITAL_COMMUNITY)
Admission: RE | Admit: 2018-12-21 | Discharge: 2018-12-21 | Disposition: A | Discharge status: Home / Self Care | Payer: Medicare Other | Source: Ambulatory Visit | Attending: Gastroenterology | Admitting: Gastroenterology

## 2018-12-21 ENCOUNTER — Other Ambulatory Visit (HOSPITAL_COMMUNITY)
Admission: RE | Admit: 2018-12-21 | Discharge: 2018-12-21 | Disposition: A | Discharge status: Home / Self Care | Payer: Medicare Other | Source: Ambulatory Visit | Attending: Gastroenterology | Admitting: Gastroenterology

## 2018-12-21 NOTE — Telephone Encounter (Signed)
Noted  

## 2018-12-22 ENCOUNTER — Other Ambulatory Visit: Payer: Self-pay

## 2018-12-22 ENCOUNTER — Ambulatory Visit (INDEPENDENT_AMBULATORY_CARE_PROVIDER_SITE_OTHER): Payer: Medicare Other

## 2018-12-22 DIAGNOSIS — F259 Schizoaffective disorder, unspecified: Secondary | ICD-10-CM

## 2018-12-22 DIAGNOSIS — N183 Chronic kidney disease, stage 3 (moderate): Secondary | ICD-10-CM | POA: Diagnosis not present

## 2018-12-22 DIAGNOSIS — I129 Hypertensive chronic kidney disease with stage 1 through stage 4 chronic kidney disease, or unspecified chronic kidney disease: Secondary | ICD-10-CM

## 2018-12-22 DIAGNOSIS — I739 Peripheral vascular disease, unspecified: Secondary | ICD-10-CM

## 2018-12-22 DIAGNOSIS — G40909 Epilepsy, unspecified, not intractable, without status epilepticus: Secondary | ICD-10-CM

## 2018-12-22 DIAGNOSIS — D539 Nutritional anemia, unspecified: Secondary | ICD-10-CM

## 2018-12-22 DIAGNOSIS — R296 Repeated falls: Secondary | ICD-10-CM

## 2018-12-22 DIAGNOSIS — E785 Hyperlipidemia, unspecified: Secondary | ICD-10-CM

## 2018-12-22 DIAGNOSIS — Q048 Other specified congenital malformations of brain: Secondary | ICD-10-CM

## 2018-12-22 DIAGNOSIS — K219 Gastro-esophageal reflux disease without esophagitis: Secondary | ICD-10-CM

## 2018-12-23 ENCOUNTER — Other Ambulatory Visit: Payer: Self-pay | Admitting: Family Medicine

## 2018-12-23 ENCOUNTER — Encounter (HOSPITAL_COMMUNITY): Admission: RE | Payer: Self-pay | Source: Ambulatory Visit

## 2018-12-23 ENCOUNTER — Encounter: Payer: Self-pay | Admitting: Family Medicine

## 2018-12-23 ENCOUNTER — Ambulatory Visit (HOSPITAL_COMMUNITY): Admission: RE | Admit: 2018-12-23 | Payer: Medicare Other | Source: Ambulatory Visit | Admitting: Gastroenterology

## 2018-12-23 DIAGNOSIS — R600 Localized edema: Secondary | ICD-10-CM | POA: Insufficient documentation

## 2018-12-23 SURGERY — COLONOSCOPY WITH PROPOFOL
Anesthesia: Monitor Anesthesia Care

## 2019-01-05 ENCOUNTER — Encounter: Payer: Self-pay | Admitting: Gastroenterology

## 2019-01-05 ENCOUNTER — Ambulatory Visit (INDEPENDENT_AMBULATORY_CARE_PROVIDER_SITE_OTHER): Payer: Medicare Other | Admitting: Nurse Practitioner

## 2019-01-05 ENCOUNTER — Other Ambulatory Visit: Payer: Self-pay

## 2019-01-05 ENCOUNTER — Encounter: Payer: Self-pay | Admitting: Nurse Practitioner

## 2019-01-05 VITALS — BP 125/64 | HR 69 | Temp 98.2°F | Ht 61.0 in | Wt 223.6 lb

## 2019-01-05 DIAGNOSIS — L03311 Cellulitis of abdominal wall: Secondary | ICD-10-CM

## 2019-01-05 DIAGNOSIS — K625 Hemorrhage of anus and rectum: Secondary | ICD-10-CM | POA: Diagnosis not present

## 2019-01-05 DIAGNOSIS — K439 Ventral hernia without obstruction or gangrene: Secondary | ICD-10-CM

## 2019-01-05 DIAGNOSIS — K219 Gastro-esophageal reflux disease without esophagitis: Secondary | ICD-10-CM | POA: Diagnosis not present

## 2019-01-05 DIAGNOSIS — L0390 Cellulitis, unspecified: Secondary | ICD-10-CM | POA: Insufficient documentation

## 2019-01-05 DIAGNOSIS — K5900 Constipation, unspecified: Secondary | ICD-10-CM | POA: Diagnosis not present

## 2019-01-05 MED ORDER — SULFAMETHOXAZOLE-TRIMETHOPRIM 800-160 MG PO TABS: 1.0000 | ORAL_TABLET | Freq: Two times a day (BID) | ORAL | 0 refills | Status: AC

## 2019-01-05 NOTE — Patient Instructions (Addendum)
Your health issues we discussed today were:   Constipation with rectal bleeding: 1. I am glad your constipation rectal bleeding have improved for now 2. Per your request, we will hold off on colonoscopy for now 3. If you have recurrent symptoms we can discuss further colonoscopy at a future visit 4. Call us if you have any recurrent or worsening symptoms  GERD (reflux/heartburn): 1. Continue taking your current medications 2. Call us if you have any worsening or severe symptoms  Cellulitis (skin infection): 1. Because of the area on your abdomen possibly being infected I am sending in an antibiotic to your pharmacy 2. I have sent in Bactrim DS (TMP/SMX).  Take this twice a day for 5 days 3. Schedule follow-up with primary care to further evaluate and for further recommendations  Overall I recommend:  1. We will send a referral to local surgeon for you, per your request 2. Continue your other current medications 3. Call us if you have any questions or concerns 4. Return for follow-up in 6 months   Because of recent events of COVID-19 ("Coronavirus"), follow CDC recommendations:  1. Wash your hand frequently 2. Avoid touching your face 3. Stay away from people who are sick 4. If you have symptoms such as fever, cough, shortness of breath then call your healthcare provider for further guidance 5. If you are sick, STAY AT HOME unless otherwise directed by your healthcare provider. 6. Follow directions from state and national officials regarding staying safe   At [facility] we value your feedback. You may receive a survey about your visit today. Please share your experience as we strive to create trusting relationships with our patients to provide genuine, compassionate, quality care.  We appreciate your understanding and patience as we review any laboratory studies, imaging, and other diagnostic tests that are ordered as we care for you. Our office policy is 5  business days for review of these results, and any emergent or urgent results are addressed in a timely manner for your best interest. If you do not hear from our office in 1 week, please contact us.   We also encourage the use of MyChart, which contains your medical information for your review as well. If you are not enrolled in this feature, an access code is on this after visit summary for your convenience. Thank you for allowing [place] to be involved in your care.  It was great to see you today!  I hope you have a great Fall!!

## 2019-01-05 NOTE — Progress Notes (Signed)
Referring Provider: Baruch Gouty, FNP Primary Care Physician:  Loman Brooklyn, FNP Primary GI:  Dr. Oneida Alar  Chief Complaint  Patient presents with  . Constipation    not having any issues with constipation  . Gastroesophageal Reflux  . Referral    wants to be referred for hernia surgery as it bothers patient    HPI:   Pam Sanders is a 68 y.o. female who presents for follow-up on GERD and constipation.  The patient was last seen in our office 11/03/2018 for GERD, constipation, upper abdominal pain, rectal bleeding.  Previous emergency room visit for abdominal pain and CT of the abdomen and pelvis showed a known large ventral hernia containing nonobstructed bowel and small uterine leiomyoma, otherwise negative.  Baseline hemoglobin 10.0-11.8 over the past 2 years.  No history of colonoscopy in our system.  At her last visit she was doing well overall.  Her sister stated she had a colonoscopy 7 years ago at [facility], now [facility].  Has seen a surgeon at [facility] who declined to operate on her hernia due to her weight and "kidney problem".  Noted CKD stage III per eGFR.  Regular GERD symptoms currently on Pepcid, cannot have a bowel movement despite stool softeners, hard stools and straining.  Nausea but no vomiting.  Notes abdominal pain for several months that hurts daily in the area of her hernia.  Starting Linzess 72 mcg, progress report in 2 to 3 weeks, omeprazole 20 mg twice daily, request previous colonoscopy report, schedule upcoming colonoscopy, follow-up in 2 months.  She did not call with a progress report related to [place] as requested.  Called our office 12/20/2018 to cancel her procedure and will reschedule at a later date.  The procedure has yet to be rescheduled.  Today she is accompanied by her sister. States the pharmacy delivered the prep early and the patient drank it. Last colonoscopy her sister had to stay with her overnight. She  isn't sure she would be able to tolerate a colonoscopy without someone staying with her. Constipation is essentially resolved at this time. Denies any further rectal bleeding. They noted she had scratched her former incision and it now looks bad. Her sister cleaned the area, placed a bandage. CNA checked the site and the CNA felt it was infected. Some neosporin has been applied. GERD doing well on PPI, no further issues. Denies abdominal pain, N/V, hematochezia, melena, fever, chills, unintentional weight loss. Denies URI or flu-like symptoms. Denies loss of sense of taste or smell. Denies chest pain, dyspnea, dizziness, lightheadedness, syncope, near syncope. Denies any other upper or lower GI symptoms.  Past Medical History:  Diagnosis Date  . Diabetes ([place])    Resolved  . Gait disturbance   . GERD (gastroesophageal reflux disease)   . HA (headache)   . Hernia of abdominal wall   . Hypertension   . Lower extremity edema   . Seizures ([place])   . Thyroid disease   . Tremor     Past Surgical History:  Procedure Laterality Date  . CATARACT EXTRACTION, BILATERAL    . STOMACH SURGERY      Current Outpatient Medications  Medication Sig Dispense Refill  . alum & mag hydroxide-simeth (MAALOX/MYLANTA) 200-200-20 MG/5ML suspension Take 30 mLs by mouth every 6 (six) hours as needed for indigestion or heartburn.    . Cholecalciferol (VITAMIN D-3) 1000 UNITS CAPS Take 1,000 Units by mouth daily.     . famotidine (  PEPCID) 20 MG tablet Take 1 tablet (20 mg total) by mouth at bedtime. 90 tablet 1  . fluticasone (FLONASE) 50 MCG/ACT nasal spray Place 2 sprays into both nostrils at bedtime. 16 g 5  . furosemide (LASIX) 20 MG tablet Take 1 tablet (20 mg total) by mouth daily. 90 tablet 1  . lamoTRIgine (LAMICTAL) 100 MG tablet Take 1 tablet (100 mg total) by mouth 2 (two) times daily. 180 tablet 1  . levothyroxine (SYNTHROID) 175 MCG tablet Take 1 tablet (175 mcg total) by mouth daily before breakfast. 30  tablet 2  . linaclotide (LINZESS) 72 MCG capsule Take 1 capsule (72 mcg total) by mouth daily before breakfast. 30 capsule 3  . losartan (COZAAR) 100 MG tablet Take 1 tablet (100 mg total) by mouth daily. 90 tablet 1  . Melatonin 5 MG CAPS Take 5 mg by mouth at bedtime.    . Multiple Vitamin (MULTIVITAMIN WITH MINERALS) TABS tablet Take 1 tablet by mouth daily.    . Olopatadine HCl 0.2 % SOLN Apply 1 drop to eye daily. 2.5 mL 0  . Omega-3 Fatty Acids (FISH OIL) 1000 MG CAPS Take 1 capsule by mouth daily.     Marland Kitchen omeprazole (PRILOSEC) 20 MG capsule Take 1 capsule (20 mg total) by mouth 2 (two) times daily before a meal. 60 capsule 3  . potassium chloride SA (K-DUR) 20 MEQ tablet Take 1 tablet (20 mEq total) by mouth 2 (two) times daily. 180 tablet 1  . sertraline (ZOLOFT) 50 MG tablet Take 1 tablet (50 mg total) by mouth daily. 90 tablet 1  . spironolactone (ALDACTONE) 25 MG tablet Take 1 tablet (25 mg total) by mouth daily. 90 tablet 1  . thioridazine (MELLARIL) 50 MG tablet Take 1 tablet (50 mg total) by mouth at bedtime. 90 tablet 1  . topiramate (TOPAMAX) 100 MG tablet Take 1 tablet (100 mg total) by mouth 2 (two) times daily. 180 tablet 4  . zolpidem (AMBIEN) 5 MG tablet Take 5 mg by mouth at bedtime.    Marland Kitchen atorvastatin (LIPITOR) 10 MG tablet Take 1 tablet (10 mg total) by mouth daily. 90 tablet 0  . Clotrimazole 1 % OINT Apply 1 application topically 2 (two) times daily. 56.7 g 0  . loratadine (CLARITIN) 10 MG tablet Take 1 tablet (10 mg total) by mouth daily. 30 tablet 2  . sulfamethoxazole-trimethoprim (BACTRIM DS) 800-160 MG tablet Take 1 tablet by mouth 2 (two) times daily for 5 days. 10 tablet 0   No current facility-administered medications for this visit.     Allergies as of 01/05/2019  . (No Known Allergies)    Family History  Problem Relation Age of Onset  . Stroke Mother   . Stroke Father   . Stroke Other   . Diabetes Other   . Heart Problems Other   . Thyroid cancer  Sister   . Hypertension Sister   . Emphysema Brother   . Other Brother        died at birth  . Aneurysm Brother        stomach  . Throat cancer Brother   . Emphysema Brother   . Heart disease Brother   . Stroke Brother   . Other Brother        MVA  . Other Sister        died at birth  . Aneurysm Sister        head    Social History   Socioeconomic History  .  Marital status: Single    Spouse name: Not on file  . Number of children: 0  . Years of education: HS  . Highest education level: Not on file  Occupational History  . Occupation: Disabled  Social Needs  . Financial resource strain: Not on file  . Food insecurity    Worry: Not on file    Inability: Not on file  . Transportation needs    Medical: Not on file    Non-medical: Not on file  Tobacco Use  . Smoking status: Never Smoker  . Smokeless tobacco: Never Used  Substance and Sexual Activity  . Alcohol use: No  . Drug use: No  . Sexual activity: Not on file  Lifestyle  . Physical activity    Days per week: Not on file    Minutes per session: Not on file  . Stress: Not on file  Relationships  . Social Herbalist on phone: Not on file    Gets together: Not on file    Attends religious service: Not on file    Active member of club or organization: Not on file    Attends meetings of clubs or organizations: Not on file    Relationship status: Not on file  Other Topics Concern  . Not on file  Social History Narrative   Patient lives at home alone. Patient has a high school education. Her sister stays with her 40 hours per week.   Caffeine - one soda daily.    Review of Systems: General: Negative for anorexia, weight loss, fever, chills, fatigue, weakness. ENT: Negative for hoarseness, difficulty swallowing. CV: Negative for chest pain, angina, palpitations, peripheral edema.  Respiratory: Negative for dyspnea at rest, cough, sputum, wheezing.  GI: See history of present illness. Endo:  Negative for unusual weight change.  Heme: Negative for bruising or bleeding. Allergy: Negative for rash or hives.   Physical Exam: BP 125/64   Pulse 69   Temp 98.2 F (36.8 C) (Oral)   Ht 5' 1"  (1.549 m)   Wt 223 lb 9.6 oz (101.4 kg)   BMI 42.25 kg/m  General:   Alert and oriented. Pleasant and cooperative. Well-nourished and well-developed.  Eyes:  Without icterus, sclera clear and conjunctiva pink.  Ears:  Normal auditory acuity. Cardiovascular:  S1, S2 present without murmurs appreciated. Extremities without clubbing or edema. Respiratory:  Clear to auscultation bilaterally. No wheezes, rales, or rhonchi. No distress.  Gastrointestinal:  +BS, soft, non-tender and non-distended. No HSM noted. No guarding or rebound. No masses appreciated.  Rectal:  Deferred  Musculoskalatal:  Symmetrical without gross deformities. Skin:  Intact. Midline area near old surgical incision scar looks escoriated with mild drainage, redness, warmth. No purulence noted. Neurologic:  Alert and oriented x4;  grossly normal neurologically. Psych:  Alert and cooperative. Normal mood and affect. Heme/Lymph/Immune: No excessive bruising noted.    01/12/2019 5:04 PM   Disclaimer: This note was dictated with voice recognition software. Similar sounding words can inadvertently be transcribed and may not be corrected upon review.

## 2019-01-11 ENCOUNTER — Other Ambulatory Visit: Payer: Self-pay

## 2019-01-12 ENCOUNTER — Encounter: Payer: Self-pay | Admitting: Family Medicine

## 2019-01-12 ENCOUNTER — Ambulatory Visit (INDEPENDENT_AMBULATORY_CARE_PROVIDER_SITE_OTHER): Payer: Medicare Other | Admitting: Family Medicine

## 2019-01-12 ENCOUNTER — Encounter: Payer: Self-pay | Admitting: Nurse Practitioner

## 2019-01-12 ENCOUNTER — Other Ambulatory Visit: Payer: Self-pay | Admitting: *Deleted

## 2019-01-12 VITALS — BP 111/75 | HR 64 | Temp 97.1°F | Ht 61.0 in | Wt 221.0 lb

## 2019-01-12 DIAGNOSIS — L03311 Cellulitis of abdominal wall: Secondary | ICD-10-CM | POA: Diagnosis not present

## 2019-01-12 DIAGNOSIS — E039 Hypothyroidism, unspecified: Secondary | ICD-10-CM

## 2019-01-12 DIAGNOSIS — G479 Sleep disorder, unspecified: Secondary | ICD-10-CM

## 2019-01-12 DIAGNOSIS — Z0000 Encounter for general adult medical examination without abnormal findings: Secondary | ICD-10-CM

## 2019-01-12 DIAGNOSIS — R569 Unspecified convulsions: Secondary | ICD-10-CM

## 2019-01-12 DIAGNOSIS — F259 Schizoaffective disorder, unspecified: Secondary | ICD-10-CM

## 2019-01-12 DIAGNOSIS — B369 Superficial mycosis, unspecified: Secondary | ICD-10-CM

## 2019-01-12 DIAGNOSIS — Z23 Encounter for immunization: Secondary | ICD-10-CM

## 2019-01-12 DIAGNOSIS — Z1211 Encounter for screening for malignant neoplasm of colon: Secondary | ICD-10-CM

## 2019-01-12 DIAGNOSIS — K439 Ventral hernia without obstruction or gangrene: Secondary | ICD-10-CM

## 2019-01-12 DIAGNOSIS — Z79899 Other long term (current) drug therapy: Secondary | ICD-10-CM | POA: Diagnosis not present

## 2019-01-12 DIAGNOSIS — E669 Obesity, unspecified: Secondary | ICD-10-CM

## 2019-01-12 DIAGNOSIS — N1832 Chronic kidney disease, stage 3b: Secondary | ICD-10-CM

## 2019-01-12 DIAGNOSIS — R600 Localized edema: Secondary | ICD-10-CM

## 2019-01-12 DIAGNOSIS — E785 Hyperlipidemia, unspecified: Secondary | ICD-10-CM

## 2019-01-12 MED ORDER — LORATADINE 10 MG PO TABS: 10.0000 mg | ORAL_TABLET | Freq: Every day | ORAL | 2 refills | Status: DC

## 2019-01-12 MED ORDER — CLOTRIMAZOLE 1 % EX OINT: 1.0000 "application " | TOPICAL_OINTMENT | Freq: Two times a day (BID) | CUTANEOUS | 0 refills | Status: DC

## 2019-01-12 MED ORDER — ATORVASTATIN CALCIUM 10 MG PO TABS: 10.0000 mg | ORAL_TABLET | Freq: Every day | ORAL | 0 refills | Status: DC

## 2019-01-12 MED ORDER — SULFAMETHOXAZOLE-TRIMETHOPRIM 800-160 MG PO TABS: 1.0000 | ORAL_TABLET | Freq: Two times a day (BID) | ORAL | 0 refills | Status: AC

## 2019-01-12 NOTE — Assessment & Plan Note (Signed)
GERD symptoms currently doing well on PPI.  Recommend she continue her current medications and follow-up in 6 months.

## 2019-01-12 NOTE — Assessment & Plan Note (Addendum)
Previous colonoscopy 7 years ago at [facility].  Previously was having rectal bleeding.  Today she denies any further rectal bleeding.  Baseline hemoglobin 10.0-11.8 over the past 2 years.  Patient was planned for a colonoscopy but they delivered her prep early and she drank it.  The patient and her family are wanting to hold off on colonoscopy for now.  Recommend continue to monitor, notify us of any further bleeding, follow-up in 6 months.

## 2019-01-12 NOTE — Assessment & Plan Note (Addendum)
Constipation essentially resolved at this time on Linzess.  Recommend she continue her current medications and follow-up in 6 months.  As an aside patient does have a hernia.  She was previously evaluated by [facility] who declined to operate due to her kidney function.  It appears her kidney function has improved but she is wanting a second opinion.  We will refer her to [facility] for evaluation for possible hernia surgery.

## 2019-01-12 NOTE — Progress Notes (Signed)
Assessment & Plan:  1. Cellulitis of abdominal wall - Due to improvement, Bactrim will be continued for five more days. Cleanse with mild soap and water daily, pat dry, and cover with dry dressing.  - loratadine (CLARITIN) 10 MG tablet; Take 1 tablet (10 mg total) by mouth daily.  Dispense: 30 tablet; Refill: 2 - sulfamethoxazole-trimethoprim (BACTRIM DS) 800-160 MG tablet; Take 1 tablet by mouth 2 (two) times daily for 5 days.  Dispense: 10 tablet; Refill: 0  2. Fungal rash of trunk - Treating for fungal rash as well due to satellite lesions.  - Clotrimazole 1 % OINT; Apply 1 application topically 2 (two) times daily.  Dispense: 56.7 g; Refill: 0  3. Hypothyroidism, unspecified type - Reassessment of TSH today to determine if new dosage is appropriate.  - TSH  4. Controlled substance agreement signed - Controlled substance agreement signed for Ambien.  - [facility]iance Drug Analysis, Ur  5. Difficulty sleeping - Controlled substance agreement signed for Ambien.  - [facility] Drug Analysis, Ur  6. Stage 3b chronic kidney disease - Referral previously placed to nephrology; we will follow-up on this referral and have them call Pam Sanders to schedule appointment.   7. Hernia of abdominal wall - Patient has a referral placed by GI to a surgeon for consultation.   8. Lower extremity edema - Discussed lower extremity edema, education provided. Encouraged her to keep elevating her legs and eating a low salt diet. Advised to wear knee high compression hose. If able, try to decrease Lasix to QD PRN.   9. Obesity (BMI 30-39.9) - Encouraged diet and exercise. Pam Sanders has let Pam Sanders caregiver know her goal for walking is 30 minutes daily.   10. Schizo-affective schizophrenia, chronic condition ([place]) - Well controlled on current regimen.   11. Seizures (Pam Sanders) - Well controlled on current regimen. Managed by neurologist.   Pam Sanders ordered at last visit; patient has  not heard back from this. We will follow-up and have the Pam Sanders reach out to Pam Sanders to schedule.   13. Colon cancer screening - Patient and Pam Sanders decline colonoscopy at this time. Her last colonoscopy was reportedly seven years ago at [facility]; I am requesting this record. Patient agreeable to cologuard.  - Cologuard  14. Need for immunization against influenza - Flu Vaccine QUAD High Dose(Fluad)   Return end of next week, for re-check wound.  Pam Limes, MSN, APRN, FNP-C [facility]  Subjective:    Patient ID: Pam Sanders, female    DOB: 14-Aug-1950, 68 y.o.   MRN: [id number]  Patient Care Team: Loman Brooklyn, FNP as PCP - General (Family Medicine) Danie Binder, MD as Consulting Physician (Gastroenterology) Marcial Pacas, MD as Consulting Physician (Neurology)   Chief Complaint:  Chief Complaint  Patient presents with  . thyroid recheck  . New Patient (Initial Visit)    HPI: Pam Sanders is a 68 y.o. female presenting on 01/12/2019 for thyroid recheck and New Patient (Initial Visit)  Patient is accompanied today by her sister, Pam Sanders, who is her POA.   She is here today to re-check her thyroid. Her dosage was decreased from 200 mcg to 175 mcg six weeks ago.   Patient has not been able to wean off Ambien as she is unable to sleep without it. She would like to continue medication.   Patient has previously been referred to nephrology due to CKD but has not yet heard from them regarding  an appointment.   Patient takes Lasix daily for lower extremity edema. Pam Sanders reports it is very helpful. Pam Sanders does keep her legs elevated when she is sitting in her recliner. She has been getting out and walking some outside lately. She does eat a low salt diet. She has tried wearing compression socks in the past but reports they caused pain. Pam Sanders reports these socks only came a few inches above her ankles. Pam Sanders is unable to don  compression hose but does have a caregiver daily.   Patient was seen by Walden Field, NP with gastroenterology, about a week ago. She was treated for cellulitis of the abdomen with Bactrim DS BID x5 days. A referral was placed to a local surgeon to discuss hernia surgery. Her constipation and rectal bleeding had resolved as of that time and her GERD is well controlled. Patient was scheduled to have a colonoscopy but drank the prep the day the pharmacy delivered it instead of the day before the procedure. Her sister had to stay with her for her last colonoscopy (which is reported to have been normal) and they are not sure they want to go through with that again.   New complaints: Pam Sanders reports the area that was treated for cellulitis has improved some but still looks pretty bad. She reports the swelling and surrounding erythema has subsided. She reports this all started with itching that got worse.    Relevant past medical, surgical, family and social history reviewed and updated as indicated. Interim medical history since our last visit reviewed.  Allergies and medications reviewed and updated.  DATA REVIEWED: CHART IN EPIC  ROS: Negative unless specifically indicated above in HPI.    Current Outpatient Medications:  .  alum & mag hydroxide-simeth (MAALOX/MYLANTA) 200-200-20 MG/5ML suspension, Take 30 mLs by mouth every 6 (six) hours as needed for indigestion or heartburn., Disp: , Rfl:  .  Cholecalciferol (VITAMIN D-3) 1000 UNITS CAPS, Take 1,000 Units by mouth daily. , Disp: , Rfl:  .  famotidine (PEPCID) 20 MG tablet, Take 1 tablet (20 mg total) by mouth at bedtime., Disp: 90 tablet, Rfl: 1 .  fluticasone (FLONASE) 50 MCG/ACT nasal spray, Place 2 sprays into both nostrils at bedtime., Disp: 16 g, Rfl: 5 .  furosemide (LASIX) 20 MG tablet, Take 1 tablet (20 mg total) by mouth daily., Disp: 90 tablet, Rfl: 1 .  lamoTRIgine (LAMICTAL) 100 MG tablet, Take 1 tablet (100 mg total) by mouth 2 (two)  times daily., Disp: 180 tablet, Rfl: 1 .  levothyroxine (SYNTHROID) 175 MCG tablet, Take 1 tablet (175 mcg total) by mouth daily before breakfast., Disp: 30 tablet, Rfl: 2 .  linaclotide (LINZESS) 72 MCG capsule, Take 1 capsule (72 mcg total) by mouth daily before breakfast., Disp: 30 capsule, Rfl: 3 .  losartan (COZAAR) 100 MG tablet, Take 1 tablet (100 mg total) by mouth daily., Disp: 90 tablet, Rfl: 1 .  Melatonin 5 MG CAPS, Take 5 mg by mouth at bedtime., Disp: , Rfl:  .  Multiple Vitamin (MULTIVITAMIN WITH MINERALS) TABS tablet, Take 1 tablet by mouth daily., Disp: , Rfl:  .  Olopatadine HCl 0.2 % SOLN, Apply 1 drop to eye daily., Disp: 2.5 mL, Rfl: 0 .  Omega-3 Fatty Acids (FISH OIL) 1000 MG CAPS, Take 1 capsule by mouth daily. , Disp: , Rfl:  .  omeprazole (PRILOSEC) 20 MG capsule, Take 1 capsule (20 mg total) by mouth 2 (two) times daily before a meal., Disp: 60 capsule, Rfl: 3 .  potassium chloride SA (K-DUR) 20 MEQ tablet, Take 1 tablet (20 mEq total) by mouth 2 (two) times daily., Disp: 180 tablet, Rfl: 1 .  sertraline (ZOLOFT) 50 MG tablet, Take 1 tablet (50 mg total) by mouth daily., Disp: 90 tablet, Rfl: 1 .  spironolactone (ALDACTONE) 25 MG tablet, Take 1 tablet (25 mg total) by mouth daily., Disp: 90 tablet, Rfl: 1 .  thioridazine (MELLARIL) 50 MG tablet, Take 1 tablet (50 mg total) by mouth at bedtime., Disp: 90 tablet, Rfl: 1 .  topiramate (TOPAMAX) 100 MG tablet, Take 1 tablet (100 mg total) by mouth 2 (two) times daily., Disp: 180 tablet, Rfl: 4 .  zolpidem (AMBIEN) 5 MG tablet, Take 5 mg by mouth at bedtime., Disp: , Rfl:  .  atorvastatin (LIPITOR) 10 MG tablet, Take 1 tablet (10 mg total) by mouth daily., Disp: 90 tablet, Rfl: 0 .  Clotrimazole 1 % OINT, Apply 1 application topically 2 (two) times daily., Disp: 56.7 g, Rfl: 0 .  loratadine (CLARITIN) 10 MG tablet, Take 1 tablet (10 mg total) by mouth daily., Disp: 30 tablet, Rfl: 2 .  sulfamethoxazole-trimethoprim (BACTRIM DS)  800-160 MG tablet, Take 1 tablet by mouth 2 (two) times daily for 5 days., Disp: 10 tablet, Rfl: 0   No Known Allergies Past Medical History:  Diagnosis Date  . Diabetes ([place])    Resolved  . Gait disturbance   . GERD (gastroesophageal reflux disease)   . HA (headache)   . Hernia of abdominal wall   . Hypertension   . Lower extremity edema   . Seizures ([place])   . Thyroid disease   . Tremor     Past Surgical History:  Procedure Laterality Date  . CATARACT EXTRACTION, BILATERAL    . STOMACH SURGERY      Social History   Socioeconomic History  . Marital status: Single    Spouse name: Not on file  . Number of children: 0  . Years of education: HS  . Highest education level: Not on file  Occupational History  . Occupation: Disabled  Social Needs  . Financial resource strain: Not on file  . Food insecurity    Worry: Not on file    Inability: Not on file  . Transportation needs    Medical: Not on file    Non-medical: Not on file  Tobacco Use  . Smoking status: Never Smoker  . Smokeless tobacco: Never Used  Substance and Sexual Activity  . Alcohol use: No  . Drug use: No  . Sexual activity: Not on file  Lifestyle  . Physical activity    Days per week: Not on file    Minutes per session: Not on file  . Stress: Not on file  Relationships  . Social Herbalist on phone: Not on file    Gets together: Not on file    Attends religious service: Not on file    Active member of club or organization: Not on file    Attends meetings of clubs or organizations: Not on file    Relationship status: Not on file  . Intimate partner violence    Fear of current or ex partner: Not on file    Emotionally abused: Not on file    Physically abused: Not on file    Forced sexual activity: Not on file  Other Topics Concern  . Not on file  Social History Narrative   Patient lives at home alone. Patient has a high  school education. Her sister stays with her 40 hours per week.    Caffeine - one soda daily.        Objective:    BP 111/75   Pulse 64   Temp (!) 97.1 F (36.2 C) (Temporal)   Ht 5\' 1"  (1.549 m)   Wt 221 lb (100.2 kg)   SpO2 97%   BMI 41.76 kg/m   Physical Exam Vitals signs reviewed.  Constitutional:      General: She is not in acute distress.    Appearance: Normal appearance. She is morbidly obese. She is not ill-appearing, toxic-appearing or diaphoretic.  HENT:     Head: Normocephalic and atraumatic.  Eyes:     General: No scleral icterus.       Right eye: No discharge.        Left eye: No discharge.     Conjunctiva/sclera: Conjunctivae normal.  Neck:     Musculoskeletal: Normal range of motion.  Cardiovascular:     Rate and Rhythm: Normal rate and regular rhythm.     Heart sounds: Normal heart sounds. No murmur. No friction rub. No gallop.   Pulmonary:     Effort: Pulmonary effort is normal. No respiratory distress.     Breath sounds: Normal breath sounds. No stridor. No wheezing, rhonchi or rales.  Abdominal:     Hernia: A hernia is present. Hernia is present in the ventral area.  Musculoskeletal: Normal range of motion.  Skin:    General: Skin is warm and dry.     Capillary Refill: Capillary refill takes less than 2 seconds.     Comments: Mid-abdomen with erythema and serous drainage. No warmth. Satellite lesions present. See below for image from today.   Neurological:     General: No focal deficit present.     Mental Status: She is alert and oriented to person, place, and time. Mental status is at baseline.  Psychiatric:        Mood and Affect: Mood normal.        Behavior: Behavior normal.        Thought Content: Thought content normal.        Judgment: Judgment normal.       Lab Results  Component Value Date   TSH 0.143 (L) 11/30/2018   Lab Results  Component Value Date   WBC 7.7 11/30/2018   HGB 12.1 11/30/2018   HCT 36.5 11/30/2018   MCV 93 11/30/2018   PLT 216 11/30/2018   Lab Results  Component Value  Date   NA 143 11/30/2018   K 4.5 11/30/2018   CO2 21 11/30/2018   GLUCOSE 90 11/30/2018   BUN 23 11/30/2018   CREATININE 1.48 (H) 11/30/2018   BILITOT 0.2 11/30/2018   ALKPHOS 122 (H) 11/30/2018   AST 13 11/30/2018   ALT 6 11/30/2018   PROT 7.2 11/30/2018   ALBUMIN 4.2 11/30/2018   CALCIUM 9.6 11/30/2018   ANIONGAP 13 09/30/2018   Lab Results  Component Value Date   CHOL 181 11/30/2018   Lab Results  Component Value Date   HDL 37 (L) 11/30/2018   Lab Results  Component Value Date   LDLCALC 113 (H) 11/30/2018   Lab Results  Component Value Date   TRIG 153 (H) 11/30/2018   Lab Results  Component Value Date   CHOLHDL 4.9 (H) 11/30/2018

## 2019-01-12 NOTE — Patient Instructions (Signed)
Edema  Edema is when you have too much fluid in your body or under your skin. Edema may make your legs, feet, and ankles swell up. Swelling is also common in looser tissues, like around your eyes. This is a common condition. It gets more common as you get older. There are many possible causes of edema. Eating too much salt (sodium) and being on your feet or sitting for a long time can cause edema in your legs, feet, and ankles. Hot weather may make edema worse. Edema is usually painless. Your skin may look swollen or shiny. Follow these instructions at home:  Keep the swollen body part raised (elevated) above the level of your heart when you are sitting or lying down.  Do not sit still or stand for a long time.  Do not wear tight clothes. Do not wear garters on your upper legs.  Exercise your legs. This can help the swelling go down.  Wear elastic bandages or support stockings as told by your doctor.  Eat a low-salt (low-sodium) diet to reduce fluid as told by your doctor.  Depending on the cause of your swelling, you may need to limit how much fluid you drink (fluid restriction).  Take over-the-counter and prescription medicines only as told by your doctor. Contact a doctor if:  Treatment is not working.  You have heart, liver, or kidney disease and have symptoms of edema.  You have sudden and unexplained weight gain. Get help right away if:  You have shortness of breath or chest pain.  You cannot breathe when you lie down.  You have pain, redness, or warmth in the swollen areas.  You have heart, liver, or kidney disease and get edema all of a sudden.  You have a fever and your symptoms get worse all of a sudden. Summary  Edema is when you have too much fluid in your body or under your skin.  Edema may make your legs, feet, and ankles swell up. Swelling is also common in looser tissues, like around your eyes.  Raise (elevate) the swollen body part above the level of your  heart when you are sitting or lying down.  Follow your doctor's instructions about diet and how much fluid you can drink (fluid restriction). This information is not intended to replace advice given to you by your health care provider. Make sure you discuss any questions you have with your health care provider. Document Released: 09/10/2007 Document Revised: 03/27/2017 Document Reviewed: 04/11/2016 Elsevier Patient Education  2020 [employer]  

## 2019-01-12 NOTE — Assessment & Plan Note (Signed)
The patient has an area around her bellybutton with a previous incisional scar that began itching and she began scratching it.  Today it appears red and slightly warm.  Query possible cellulitis.  I prescribed Bactrim DS.  Recommend she follow-up with primary care.  Follow-up in 6 months.

## 2019-01-13 ENCOUNTER — Other Ambulatory Visit: Payer: Self-pay | Admitting: Family Medicine

## 2019-01-13 DIAGNOSIS — E039 Hypothyroidism, unspecified: Secondary | ICD-10-CM

## 2019-01-13 LAB — TSH: TSH: 0.448 u[IU]/mL — ABNORMAL LOW (ref 0.450–4.500)

## 2019-01-13 MED ORDER — LEVOTHYROXINE SODIUM 150 MCG PO TABS: 150.0000 ug | ORAL_TABLET | Freq: Every day | ORAL | 2 refills | Status: DC

## 2019-01-17 ENCOUNTER — Telehealth: Payer: Self-pay | Admitting: Family Medicine

## 2019-01-17 LAB — COMPLIANCE DRUG ANALYSIS, UR

## 2019-01-19 ENCOUNTER — Ambulatory Visit: Payer: Medicare Other

## 2019-01-21 ENCOUNTER — Telehealth: Payer: Self-pay | Admitting: Family Medicine

## 2019-01-21 ENCOUNTER — Other Ambulatory Visit: Payer: Self-pay

## 2019-01-21 ENCOUNTER — Encounter: Payer: Self-pay | Admitting: Family Medicine

## 2019-01-21 ENCOUNTER — Ambulatory Visit (INDEPENDENT_AMBULATORY_CARE_PROVIDER_SITE_OTHER): Payer: Medicare Other | Admitting: Family Medicine

## 2019-01-21 VITALS — BP 118/57 | HR 63 | Temp 96.6°F | Resp 20 | Ht 61.0 in | Wt 222.0 lb

## 2019-01-21 DIAGNOSIS — L03311 Cellulitis of abdominal wall: Secondary | ICD-10-CM | POA: Diagnosis not present

## 2019-01-21 DIAGNOSIS — B369 Superficial mycosis, unspecified: Secondary | ICD-10-CM | POA: Diagnosis not present

## 2019-01-21 DIAGNOSIS — L853 Xerosis cutis: Secondary | ICD-10-CM | POA: Diagnosis not present

## 2019-01-21 NOTE — Progress Notes (Signed)
Assessment & Plan:  1. Fungal rash of trunk - Improving. Continue Clotrimazole until completely resolved.   2. Cellulitis of abdominal wall - Resolved.  3. Xerosis of skin - Encouraged to apply Eucerin, Ceptaphil, Aquphor, or Cerave daily to help with dry skin and scratching.   Follow up plan: Return in about 3 months (around 04/23/2019) for follow-up of chronic medication conditions.  Pam Limes, MSN, APRN, FNP-C [facility]  Subjective:   Patient ID: Pam Sanders, female    DOB: 1950/08/24, 68 y.o.   MRN: [id number]  HPI: Pam Sanders is a 68 y.o. female presenting on 01/21/2019 for Wound Check  Patient was seen about a week ago and was treated with Bactrim DS and Clotrimazole due to cellulitis and a fungal rash. She returns today for a recheck of this area. She has completed the Bactrim and is still applying the Clotrimazole. Her sister/POA, Pam Sanders, that is with her today reports she is still scratching.    ROS: Negative unless specifically indicated above in HPI.   Relevant past medical history reviewed and updated as indicated.   Allergies and medications reviewed and updated.   Current Outpatient Medications:  .  alum & mag hydroxide-simeth (MAALOX/MYLANTA) 200-200-20 MG/5ML suspension, Take 30 mLs by mouth every 6 (six) hours as needed for indigestion or heartburn., Disp: , Rfl:  .  atorvastatin (LIPITOR) 10 MG tablet, Take 1 tablet (10 mg total) by mouth daily., Disp: 90 tablet, Rfl: 0 .  Cholecalciferol (VITAMIN D-3) 1000 UNITS CAPS, Take 1,000 Units by mouth daily. , Disp: , Rfl:  .  Clotrimazole 1 % OINT, Apply 1 application topically 2 (two) times daily., Disp: 56.7 g, Rfl: 0 .  famotidine (PEPCID) 20 MG tablet, Take 1 tablet (20 mg total) by mouth at bedtime., Disp: 90 tablet, Rfl: 1 .  fluticasone (FLONASE) 50 MCG/ACT nasal spray, Place 2 sprays into both nostrils at bedtime., Disp: 16 g, Rfl: 5 .  furosemide (LASIX) 20 MG tablet,  Take 1 tablet (20 mg total) by mouth daily., Disp: 90 tablet, Rfl: 1 .  lamoTRIgine (LAMICTAL) 100 MG tablet, Take 1 tablet (100 mg total) by mouth 2 (two) times daily., Disp: 180 tablet, Rfl: 1 .  levothyroxine (SYNTHROID) 150 MCG tablet, Take 1 tablet (150 mcg total) by mouth daily before breakfast., Disp: 30 tablet, Rfl: 2 .  linaclotide (LINZESS) 72 MCG capsule, Take 1 capsule (72 mcg total) by mouth daily before breakfast., Disp: 30 capsule, Rfl: 3 .  loratadine (CLARITIN) 10 MG tablet, Take 1 tablet (10 mg total) by mouth daily., Disp: 30 tablet, Rfl: 2 .  losartan (COZAAR) 100 MG tablet, Take 1 tablet (100 mg total) by mouth daily., Disp: 90 tablet, Rfl: 1 .  Melatonin 5 MG CAPS, Take 5 mg by mouth at bedtime., Disp: , Rfl:  .  Multiple Vitamin (MULTIVITAMIN WITH MINERALS) TABS tablet, Take 1 tablet by mouth daily., Disp: , Rfl:  .  Olopatadine HCl 0.2 % SOLN, Apply 1 drop to eye daily., Disp: 2.5 mL, Rfl: 0 .  Omega-3 Fatty Acids (FISH OIL) 1000 MG CAPS, Take 1 capsule by mouth daily. , Disp: , Rfl:  .  omeprazole (PRILOSEC) 20 MG capsule, Take 1 capsule (20 mg total) by mouth 2 (two) times daily before a meal., Disp: 60 capsule, Rfl: 3 .  potassium chloride SA (K-DUR) 20 MEQ tablet, Take 1 tablet (20 mEq total) by mouth 2 (two) times daily., Disp: 180 tablet, Rfl: 1 .  sertraline (ZOLOFT) 50 MG tablet, Take 1 tablet (50 mg total) by mouth daily., Disp: 90 tablet, Rfl: 1 .  spironolactone (ALDACTONE) 25 MG tablet, Take 1 tablet (25 mg total) by mouth daily., Disp: 90 tablet, Rfl: 1 .  thioridazine (MELLARIL) 50 MG tablet, Take 1 tablet (50 mg total) by mouth at bedtime., Disp: 90 tablet, Rfl: 1 .  topiramate (TOPAMAX) 100 MG tablet, Take 1 tablet (100 mg total) by mouth 2 (two) times daily., Disp: 180 tablet, Rfl: 4 .  zolpidem (AMBIEN) 5 MG tablet, Take 5 mg by mouth at bedtime., Disp: , Rfl:   No Known Allergies  Objective:   BP (!) 118/57   Pulse 63   Temp (!) 96.6 F (35.9 C)  (Temporal)   Resp 20   Ht 5\' 1"  (1.549 m)   Wt 222 lb (100.7 kg)   SpO2 100%   BMI 41.95 kg/m    Physical Exam Vitals signs reviewed.  Constitutional:      General: She is not in acute distress.    Appearance: Normal appearance. She is morbidly obese. She is not ill-appearing, toxic-appearing or diaphoretic.  HENT:     Head: Normocephalic and atraumatic.  Eyes:     General: No scleral icterus.       Right eye: No discharge.        Left eye: No discharge.     Conjunctiva/sclera: Conjunctivae normal.  Neck:     Musculoskeletal: Normal range of motion.  Cardiovascular:     Rate and Rhythm: Normal rate.     Heart sounds: No murmur.  Pulmonary:     Effort: Pulmonary effort is normal. No respiratory distress.  Abdominal:     Hernia: A hernia is present. Hernia is present in the ventral area.  Musculoskeletal: Normal range of motion.  Skin:    General: Skin is warm and dry.     Capillary Refill: Capillary refill takes less than 2 seconds.     Findings: Wound (much improved from a week ago, some remaining erythema - 01/12/2019 and now pictures below ) present.     Comments: Dry skin all over.  Neurological:     General: No focal deficit present.     Mental Status: She is alert and oriented to person, place, and time. Mental status is at baseline.  Psychiatric:        Mood and Affect: Mood normal.        Behavior: Behavior normal.        Thought Content: Thought content normal.        Judgment: Judgment normal.     01/12/2019:    01/21/2019 (today):

## 2019-01-21 NOTE — Telephone Encounter (Signed)
Please call patient and let her know her mammogram has been scheduled for Friday, December 4th at 2:00 PM. She should arrive at 1:40 PM for registration. No powders, perfumes, or deodorant.

## 2019-01-21 NOTE — Telephone Encounter (Signed)
Sister notified and verbalized understanding.

## 2019-01-22 ENCOUNTER — Encounter: Payer: Self-pay | Admitting: Family Medicine

## 2019-01-24 NOTE — Progress Notes (Signed)
Patient is scheduled on 10/20.

## 2019-01-25 ENCOUNTER — Ambulatory Visit (INDEPENDENT_AMBULATORY_CARE_PROVIDER_SITE_OTHER): Payer: Medicare Other | Admitting: General Surgery

## 2019-01-25 ENCOUNTER — Encounter: Payer: Self-pay | Admitting: General Surgery

## 2019-01-25 ENCOUNTER — Other Ambulatory Visit: Payer: Self-pay

## 2019-01-25 VITALS — BP 113/75 | HR 66 | Temp 96.2°F | Resp 18 | Ht 61.0 in | Wt 219.0 lb

## 2019-01-25 DIAGNOSIS — K439 Ventral hernia without obstruction or gangrene: Secondary | ICD-10-CM

## 2019-01-25 DIAGNOSIS — R238 Other skin changes: Secondary | ICD-10-CM

## 2019-01-25 NOTE — Patient Instructions (Signed)
Use lotion for th skin on the abdomen. Order an abdominal binder and wear it as much as possible to help with control of the hernia and prevent scratching.    Ventral Hernia  A ventral hernia is a bulge of tissue from inside the abdomen that pushes through a weak area of the muscles that form the front wall of the abdomen. The tissues inside the abdomen are inside a sac (peritoneum). These tissues include the small intestine, large intestine, and the fatty tissue that covers the intestines (omentum). Sometimes, the bulge that forms a hernia contains intestines. Other hernias contain only fat. Ventral hernias do not go away without surgical treatment. There are several types of ventral hernias. You may have:  A hernia at an incision site from previous abdominal surgery (incisional hernia).  A hernia just above the belly button (epigastric hernia), or at the belly button (umbilical hernia). These types of hernias can develop from heavy lifting or straining.  A hernia that comes and goes (reducible hernia). It may be visible only when you lift or strain. This type of hernia can be pushed back into the abdomen (reduced).  A hernia that traps abdominal tissue inside the hernia (incarcerated hernia). This type of hernia does not reduce.  A hernia that cuts off blood flow to the tissues inside the hernia (strangulated hernia). The tissues can start to die if this happens. This is a very painful bulge that cannot be reduced. A strangulated hernia is a medical emergency. What are the causes? This condition is caused by abdominal tissue putting pressure on an area of weakness in the abdominal muscles. What increases the risk? The following factors may make you more likely to develop this condition:  Being female.  Being 44 or older.  Being overweight or obese.  Having had previous abdominal surgery, especially if there was an infection after surgery.  Having had an injury to the abdominal wall.   Having had several pregnancies.  Having a buildup of fluid inside the abdomen (ascites). What are the signs or symptoms? The only symptom of a ventral hernia may be a painless bulge in the abdomen. A reducible hernia may be visible only when you strain, cough, or lift. Other symptoms may include:  Dull pain.  A feeling of pressure. Signs and symptoms of a strangulated hernia may include:  Increasing pain.  Nausea and vomiting.  Pain when pressing on the hernia.  The skin over the hernia turning red or purple.  Constipation.  Blood in the stool (feces). How is this diagnosed? This condition may be diagnosed based on:  Your symptoms.  Your medical history.  A physical exam. You may be asked to cough or strain while standing. These actions increase the pressure inside your abdomen and force the hernia through the opening in your muscles. Your health care provider may try to reduce the hernia by pressing on it.  Imaging studies, such as an ultrasound or CT scan. How is this treated? This condition is treated with surgery. If you have a strangulated hernia, surgery is done as soon as possible. If your hernia is small and not incarcerated, you may be asked to lose some weight before surgery. Follow these instructions at home:  Follow instructions from your health care provider about eating or drinking restrictions.  If you are overweight, your health care provider may recommend that you increase your activity level and eat a healthier diet.  Do not lift anything that is heavier than 10 lb (  4.5 kg).  Return to your normal activities as told by your health care provider. Ask your health care provider what activities are safe for you. You may need to avoid activities that increase pressure on your hernia.  Take over-the-counter and prescription medicines only as told by your health care provider.  Keep all follow-up visits as told by your health care provider. This is important.  Contact a health care provider if:  Your hernia gets larger.  Your hernia becomes painful. Get help right away if:  Your hernia becomes increasingly painful.  You have pain along with any of the following: ? Changes in skin color in the area of the hernia. ? Nausea. ? Vomiting. ? Fever. Summary  A ventral hernia is a bulge of tissue from inside the abdomen that pushes through a weak area of the muscles that form the front wall of the abdomen.  This condition is treated with surgery, which may be urgent depending on your hernia.  Do not lift anything that is heavier than 10 lb (4.5 kg), and follow activity instructions from your health care provider. This information is not intended to replace advice given to you by your health care provider. Make sure you discuss any questions you have with your health care provider. Document Released: 03/10/2012 Document Revised: 05/06/2017 Document Reviewed: 10/13/2016 Elsevier Patient Education  2020 [employer].
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
## 2019-01-26 ENCOUNTER — Encounter: Payer: Self-pay | Admitting: Family Medicine

## 2019-02-09 ENCOUNTER — Ambulatory Visit: Payer: Medicare Other

## 2019-02-17 ENCOUNTER — Other Ambulatory Visit: Payer: Self-pay | Admitting: *Deleted

## 2019-02-23 ENCOUNTER — Ambulatory Visit (INDEPENDENT_AMBULATORY_CARE_PROVIDER_SITE_OTHER): Payer: Medicare Other | Admitting: *Deleted

## 2019-02-23 DIAGNOSIS — Z0000 Encounter for general adult medical examination without abnormal findings: Secondary | ICD-10-CM | POA: Diagnosis not present

## 2019-02-23 NOTE — Progress Notes (Addendum)
MEDICARE ANNUAL WELLNESS VISIT  02/23/2019  Telephone Visit Disclaimer This Medicare AWV was conducted by telephone due to national recommendations for restrictions regarding the COVID-19 Pandemic (e.g. social distancing).  I verified, using two identifiers, that I am speaking with Pam Sanders or their authorized healthcare agent. I discussed the limitations, risks, security, and privacy concerns of performing an evaluation and management service by telephone and the potential availability of an in-person appointment in the future. The patient expressed understanding and agreed to proceed.   Subjective:  Pam Sanders is a 68 y.o. female patient of Loman Brooklyn, FNP who had a Medicare Annual Wellness Visit today via telephone.Her Aid was on the phone with her as well to help since she is hard of hearing. Pam Sanders is Disabled and lives alone. she has 0 children. she reports that she is socially active and does interact with friends/family regularly. she is minimally physically active and enjoys knitting, playing bingo/checkers and watching Lets Make a Deal and The Price is Right.Her Sister is her general POA and takes care of most everything for her and visits her daily as well as an Aid that is with her 16-20 hours a week.  Patient Care Team: Loman Brooklyn, FNP as PCP - General (Family Medicine) Danie Binder, MD as Consulting Physician (Gastroenterology) Marcial Pacas, MD as Consulting Physician (Neurology)  Advanced Directives 02/23/2019 09/30/2018 04/28/2016 04/05/2016 04/05/2016 04/05/2016  Does Patient Have a Medical Advance Directive? No No No Yes Yes Yes  Type of Advance Directive - - - Personal assistant Power of Attorney  Does patient want to make changes to medical advance directive? - - - No - Patient declined No - Patient declined -  Copy of [place] in Chart? - - - No - copy requested No - copy  requested -  Would patient like information on creating a medical advance directive? No - Patient declined No - Patient declined - - - [facility] Utilization Over the Past 12 Months: # of hospitalizations or ER visits: 0 # of surgeries: 0  Review of Systems    Patient reports that her overall health is better compared to last year.  History obtained from chart review  Patient Reported Readings (BP, Pulse, CBG, Weight, etc) none  Pain Assessment Pain : No/denies pain     Current Medications & Allergies (verified) Allergies as of 02/23/2019   No Known Allergies     Medication List       Accurate as of February 23, 2019  9:54 AM. If you have any questions, ask your nurse or doctor.        alum & mag hydroxide-simeth 200-200-20 MG/5ML suspension Commonly known as: MAALOX/MYLANTA Take 30 mLs by mouth every 6 (six) hours as needed for indigestion or heartburn.   atorvastatin 10 MG tablet Commonly known as: LIPITOR Take 1 tablet (10 mg total) by mouth daily.   Clotrimazole 1 % Oint Apply 1 application topically 2 (two) times daily.   famotidine 20 MG tablet Commonly known as: PEPCID Take 1 tablet (20 mg total) by mouth at bedtime.   Fish Oil 1000 MG Caps Take 1 capsule by mouth daily.   fluticasone 50 MCG/ACT nasal spray Commonly known as: FLONASE Place 2 sprays into both nostrils at bedtime.   furosemide 20 MG tablet Commonly known as: LASIX Take 1 tablet (20 mg total) by mouth daily.   lamoTRIgine  100 MG tablet Commonly known as: LaMICtal Take 1 tablet (100 mg total) by mouth 2 (two) times daily.   levothyroxine 150 MCG tablet Commonly known as: SYNTHROID Take 1 tablet (150 mcg total) by mouth daily before breakfast.   linaclotide 72 MCG capsule Commonly known as: Linzess Take 1 capsule (72 mcg total) by mouth daily before breakfast.   loratadine 10 MG tablet Commonly known as: CLARITIN Take 1 tablet (10 mg total) by mouth daily.   losartan 100  MG tablet Commonly known as: COZAAR Take 1 tablet (100 mg total) by mouth daily.   Melatonin 5 MG Caps Take 5 mg by mouth at bedtime.   multivitamin with minerals Tabs tablet Take 1 tablet by mouth daily.   Olopatadine HCl 0.2 % Soln Apply 1 drop to eye daily.   omeprazole 20 MG capsule Commonly known as: PRILOSEC Take 1 capsule (20 mg total) by mouth 2 (two) times daily before a meal.   potassium chloride SA 20 MEQ tablet Commonly known as: KLOR-CON Take 1 tablet (20 mEq total) by mouth 2 (two) times daily.   sertraline 50 MG tablet Commonly known as: ZOLOFT Take 1 tablet (50 mg total) by mouth daily.   spironolactone 25 MG tablet Commonly known as: ALDACTONE Take 1 tablet (25 mg total) by mouth daily.   thioridazine 50 MG tablet Commonly known as: MELLARIL Take 1 tablet (50 mg total) by mouth at bedtime.   topiramate 100 MG tablet Commonly known as: TOPAMAX Take 1 tablet (100 mg total) by mouth 2 (two) times daily.   Vitamin D-3 25 MCG (1000 UT) Caps Take 1,000 Units by mouth daily.   zolpidem 5 MG tablet Commonly known as: AMBIEN Take 5 mg by mouth at bedtime.       History (reviewed): Past Medical History:  Diagnosis Date  . Gait disturbance   . GERD (gastroesophageal reflux disease)   . HA (headache)   . Hernia of abdominal wall   . Hypertension   . Lower extremity edema   . Seizures ([place])   . Thyroid disease   . Tremor    Past Surgical History:  Procedure Laterality Date  . CATARACT EXTRACTION, BILATERAL    . STOMACH SURGERY     Family History  Problem Relation Age of Onset  . Stroke Mother   . Stroke Father   . Stroke Other   . Diabetes Other   . Heart Problems Other   . Thyroid cancer Sister   . Hypertension Sister   . Emphysema Brother   . Other Brother        died at birth  . Aneurysm Brother        stomach  . Throat cancer Brother   . Emphysema Brother   . Heart disease Brother   . Stroke Brother   . Other Brother         MVA  . Other Sister        died at birth  . Aneurysm Sister        head   Social History   Socioeconomic History  . Marital status: Single    Spouse name: Not on file  . Number of children: 0  . Years of education: HS  . Highest education level: 12th grade  Occupational History  . Occupation: Disabled  Social Needs  . Financial resource strain: Not very hard  . Food insecurity    Worry: Never true    Inability: Never true  . Transportation  needs    Medical: No    Non-medical: No  Tobacco Use  . Smoking status: Never Smoker  . Smokeless tobacco: Never Used  Substance and Sexual Activity  . Alcohol use: No  . Drug use: No  . Sexual activity: Not Currently    Birth control/protection: Post-menopausal  Lifestyle  . Physical activity    Days per week: 7 days    Minutes per session: 10 min  . Stress: Not at all  Relationships  . Social connections    Talks on phone: More than three times a week    Gets together: More than three times a week    Attends religious service: Never    Active member of club or organization: No    Attends meetings of clubs or organizations: Never    Relationship status: Never married  Other Topics Concern  . Not on file  Social History Narrative   Patient lives at home alone. Patient has a high school education. Her sister comes in frequently to take care of medications, groceries. She now has an Aid that that comes in M-F for 16 hours a week that helps her shower and do oral care.   Caffeine - one soda daily.    Activities of Daily Living In your present state of health, do you have any difficulty performing the following activities: 02/23/2019  Hearing? Y  Comment wears hearing aids in both ears  Vision? N  Comment wears glasses-has yearly eye exam  Difficulty concentrating or making decisions? N  Walking or climbing stairs? Y  Comment uses a walker at all times, doesn't use stairs, gets a little SOB when walking at times  Dressing or  bathing? Y  Comment Aid helps with dressing and bathing  Doing errands, shopping? Y  Comment her Aid or sister has to driver her to do errands and to all appointments  Preparing Food and eating ? Y  Comment she can fix a sandwich and use the microwave-but most of her meals are prepared by the aid or sister  Using the Toilet? Y  Comment sometimes she has a hard time standing up off the toilet and pulling her pants up-does have elevated toilet seat  In the past six months, have you accidently leaked urine? Y  Comment wears a pad sometimes  Do you have problems with loss of bowel control? N  Managing your Medications? Y  Comment sister takes care of all of her medications  Managing your Finances? Y  Comment sister takes care of all the finances  Housekeeping or managing your Housekeeping? Y  Comment Aid takes care of the housekeeping  Some recent data might be hidden    Patient Education/ Literacy How often do you need to have someone help you when you read instructions, pamphlets, or other written materials from your doctor or pharmacy?: 3 - Sometimes What is the last grade level you completed in school?: 12th grade  Exercise Current Exercise Habits: Home exercise routine, Type of exercise: walking, Time (Minutes): 15, Frequency (Times/Week): 7, Weekly Exercise (Minutes/Week): 105, Intensity: Mild, Exercise limited by: orthopedic condition(s)  Diet Patient reports consuming 3 meals a day and 0 snack(s) a day Patient reports that her primary diet is: Regular Patient reports that she does have regular access to food.   Depression Screen PHQ 2/9 Scores 02/23/2019 01/12/2019  PHQ - 2 Score 0 0     Fall Risk Fall Risk  02/23/2019 01/12/2019  Falls in the past year? 0 0  Objective:  Pam Sanders seemed alert and oriented and she participated appropriately during our telephone visit.  Blood Pressure Weight BMI  BP Readings from Last 3 Encounters:  01/25/19 113/75  01/21/19  (!) 118/57  01/12/19 111/75   Wt Readings from Last 3 Encounters:  01/25/19 219 lb (99.3 kg)  01/21/19 222 lb (100.7 kg)  01/12/19 221 lb (100.2 kg)   BMI Readings from Last 1 Encounters:  01/25/19 41.38 kg/m    *Unable to obtain current vital signs, weight, and BMI due to telephone visit type  Hearing/Vision  . Miyo did  seem to have difficulty with hearing so her Aid assisted during the telephone conversation . Reports that she has had a formal eye exam by an eye care professional within the past year . Reports that she has not had a formal hearing evaluation within the past year *Unable to fully assess hearing and vision during telephone visit type  Cognitive Function: 6CIT Screen 02/23/2019  What Year? 0 points  What month? 0 points  What time? 0 points  Count back from 20 4 points  Months in reverse 4 points  Repeat phrase 10 points  Total Score 18   (Normal:0-7, Significant for Dysfunction: >8)  Normal Cognitive Function Screening: No: pt scored 18 on the screening, recommend repeating Cognitive screening at next visit with PCP.   Immunization & Health Maintenance Record Immunization History  Administered Date(s) Administered  . Fluad Quad(high Dose 65+) 01/12/2019  . Influenza-Unspecified 02/22/2000, 02/09/2002, 01/26/2003, 02/13/2005, 01/26/2006, 03/09/2014, 01/11/2015, 01/28/2016, 05/01/2016, 01/23/2017, 01/21/2018  . Pneumococcal Conjugate-13 05/01/2016  . Pneumococcal Polysaccharide-23 10/05/2014  . Tdap 10/05/2014    Health Maintenance  Topic Date Due  . COLONOSCOPY  06/17/2000  . MAMMOGRAM  11/12/2016  . PNA vac Low Risk Adult (2 of 2 - PPSV23) 10/05/2019  . TETANUS/TDAP  10/04/2024  . INFLUENZA VACCINE  Completed  . DEXA SCAN  Completed  . Hepatitis C Screening  Completed       Assessment  This is a routine wellness examination for Pam Sanders.  Health Maintenance: Due or Overdue Health Maintenance Due  Topic Date Due  . COLONOSCOPY   06/17/2000  . MAMMOGRAM  11/12/2016    Pam Sanders does not need a referral for Community Assistance: Care Management:   no Social Work:    no Prescription Assistance:  no Nutrition/Diabetes Education:  no   Plan:  Personalized Goals Goals Addressed            This Visit's Progress   . DIET - INCREASE WATER INTAKE       Try to drink 6-8 glasses of water daily      Personalized Health Maintenance & Screening Recommendations  Screening mammography Colorectal cancer screening Shingles vaccine  Lung Cancer Screening Recommended: no (Low Dose CT Chest recommended if Age 44-80 years, 30 pack-year currently smoking OR have quit w/in past 15 years) Hepatitis C Screening recommended: no HIV Screening recommended: no  Advanced Directives: Written information was not prepared per patient's request.  Referrals & Orders No orders of the defined types were placed in this encounter.   Follow-up Plan . Follow-up with Loman Brooklyn, FNP as planned . Keep your scheduled Mammogram appointment 03/11/19 as discussed . Consider Shingles vaccine at your next visit with your PCP . Complete the Cologard test you have at home and return it via mail per instructions in the kit   I have personally reviewed and noted the following in the patient's  chart:   . Medical and social history . Use of alcohol, tobacco or illicit drugs  . Current medications and supplements . Functional ability and status . Nutritional status . Physical activity . Advanced directives . List of other physicians . Hospitalizations, surgeries, and ER visits in previous 12 months . Vitals . Screenings to include cognitive, depression, and falls . Referrals and appointments  In addition, I have reviewed and discussed with Pam Sanders certain preventive protocols, quality metrics, and best practice recommendations. A written personalized care plan for preventive services as well as general preventive  health recommendations is available and can be mailed to the patient at her request.      , Donny Pique, LPN  [id number]   I have reviewed and agree with the above AWV documentation.   Evelina Dun, FNP

## 2019-02-23 NOTE — Patient Instructions (Signed)
Preventive Care 68 Years and Older, Female Preventive care refers to lifestyle choices and visits with your health care provider that can promote health and wellness. This includes:  A yearly physical exam. This is also called an annual well check.  Regular dental and eye exams.  Immunizations.  Screening for certain conditions.  Healthy lifestyle choices, such as diet and exercise. What can I expect for my preventive care visit? Physical exam Your health care provider will check:  Height and weight. These may be used to calculate body mass index (BMI), which is a measurement that tells if you are at a healthy weight.  Heart rate and blood pressure.  Your skin for abnormal spots. Counseling Your health care provider may ask you questions about:  Alcohol, tobacco, and drug use.  Emotional well-being.  Home and relationship well-being.  Sexual activity.  Eating habits.  History of falls.  Memory and ability to understand (cognition).  Work and work Statistician.  Pregnancy and menstrual history. What immunizations do I need?  Influenza (flu) vaccine  This is recommended every year. Tetanus, diphtheria, and pertussis (Tdap) vaccine  You may need a Td booster every 10 years. Varicella (chickenpox) vaccine  You may need this vaccine if you have not already been vaccinated. Zoster (shingles) vaccine  You may need this after age 33. Pneumococcal conjugate (PCV13) vaccine  One dose is recommended after age 33. Pneumococcal polysaccharide (PPSV23) vaccine  One dose is recommended after age 72. Measles, mumps, and rubella (MMR) vaccine  You may need at least one dose of MMR if you were born in 1957 or later. You may also need a second dose. Meningococcal conjugate (MenACWY) vaccine  You may need this if you have certain conditions. Hepatitis A vaccine  You may need this if you have certain conditions or if you travel or work in places where you may be exposed  to hepatitis A. Hepatitis B vaccine  You may need this if you have certain conditions or if you travel or work in places where you may be exposed to hepatitis B. Haemophilus influenzae type b (Hib) vaccine  You may need this if you have certain conditions. You may receive vaccines as individual doses or as more than one vaccine together in one shot (combination vaccines). Talk with your health care provider about the risks and benefits of combination vaccines. What tests do I need? Blood tests  Lipid and cholesterol levels. These may be checked every 5 years, or more frequently depending on your overall health.  Hepatitis C test.  Hepatitis B test. Screening  Lung cancer screening. You may have this screening every year starting at age 39 if you have a 30-pack-year history of smoking and currently smoke or have quit within the past 15 years.  Colorectal cancer screening. All adults should have this screening starting at age 36 and continuing until age 15. Your health care provider may recommend screening at age 23 if you are at increased risk. You will have tests every 1-10 years, depending on your results and the type of screening test.  Diabetes screening. This is done by checking your blood sugar (glucose) after you have not eaten for a while (fasting). You may have this done every 1-3 years.  Mammogram. This may be done every 1-2 years. Talk with your health care provider about how often you should have regular mammograms.  BRCA-related cancer screening. This may be done if you have a family history of breast, ovarian, tubal, or peritoneal cancers.  Other tests  Sexually transmitted disease (STD) testing.  Bone density scan. This is done to screen for osteoporosis. You may have this done starting at age 76. Follow these instructions at home: Eating and drinking  Eat a diet that includes fresh fruits and vegetables, whole grains, lean protein, and low-fat dairy products. Limit  your intake of foods with high amounts of sugar, saturated fats, and salt.  Take vitamin and mineral supplements as recommended by your health care provider.  Do not drink alcohol if your health care provider tells you not to drink.  If you drink alcohol: ? Limit how much you have to 0-1 drink a day. ? Be aware of how much alcohol is in your drink. In the [place] one drink equals one 12 oz bottle of beer (355 mL), one 5 oz glass of wine (148 mL), or one 1 oz glass of hard liquor (44 mL). Lifestyle  Take daily care of your teeth and gums.  Stay active. Exercise for at least 30 minutes on 5 or more days each week.  Do not use any products that contain nicotine or tobacco, such as cigarettes, e-cigarettes, and chewing tobacco. If you need help quitting, ask your health care provider.  If you are sexually active, practice safe sex. Use a condom or other form of protection in order to prevent STIs (sexually transmitted infections).  Talk with your health care provider about taking a low-dose aspirin or statin. What's next?  Go to your health care provider once a year for a well check visit.  Ask your health care provider how often you should have your eyes and teeth checked.  Stay up to date on all vaccines. This information is not intended to replace advice given to you by your health care provider. Make sure you discuss any questions you have with your health care provider. Document Released: 04/20/2015 Document Revised: 03/18/2018 Document Reviewed: 03/18/2018 Elsevier Patient Education  2020 [employer].

## 2019-02-25 ENCOUNTER — Telehealth: Payer: Self-pay | Admitting: Family Medicine

## 2019-02-25 MED ORDER — ZOLPIDEM TARTRATE 5 MG PO TABS: 5.0000 mg | ORAL_TABLET | Freq: Every day | ORAL | 2 refills | Status: DC

## 2019-02-25 NOTE — Telephone Encounter (Signed)
What is the name of the medication? Zolpidem 5 mg. Request has been sent three times and no reply  Have you contacted your pharmacy to request a refill? Yes   Which pharmacy would you like this sent to? [facility]  Patient notified that their request is being sent to the clinical staff for review and that they should receive a call once it is complete. If they do not receive a call within 24 hours they can check with their pharmacy or our office.

## 2019-03-01 ENCOUNTER — Other Ambulatory Visit: Payer: Self-pay | Admitting: Nurse Practitioner

## 2019-03-01 ENCOUNTER — Telehealth: Payer: Self-pay | Admitting: Family Medicine

## 2019-03-01 DIAGNOSIS — K219 Gastro-esophageal reflux disease without esophagitis: Secondary | ICD-10-CM

## 2019-03-01 DIAGNOSIS — K5900 Constipation, unspecified: Secondary | ICD-10-CM

## 2019-03-01 NOTE — Telephone Encounter (Signed)
Please call patient's sister [name] and follow-up on Cologuard that was ordered 01/12/2019.  Encourage for collection and return.

## 2019-03-01 NOTE — Telephone Encounter (Signed)
[email or web address]

## 2019-03-10 ENCOUNTER — Other Ambulatory Visit: Payer: Self-pay

## 2019-03-10 ENCOUNTER — Ambulatory Visit (INDEPENDENT_AMBULATORY_CARE_PROVIDER_SITE_OTHER): Payer: Medicare Other

## 2019-03-10 DIAGNOSIS — R296 Repeated falls: Secondary | ICD-10-CM

## 2019-03-10 DIAGNOSIS — N183 Chronic kidney disease, stage 3 unspecified: Secondary | ICD-10-CM

## 2019-03-10 DIAGNOSIS — D631 Anemia in chronic kidney disease: Secondary | ICD-10-CM | POA: Diagnosis not present

## 2019-03-10 DIAGNOSIS — I739 Peripheral vascular disease, unspecified: Secondary | ICD-10-CM | POA: Diagnosis not present

## 2019-03-10 DIAGNOSIS — I129 Hypertensive chronic kidney disease with stage 1 through stage 4 chronic kidney disease, or unspecified chronic kidney disease: Secondary | ICD-10-CM

## 2019-03-10 DIAGNOSIS — E039 Hypothyroidism, unspecified: Secondary | ICD-10-CM

## 2019-03-10 DIAGNOSIS — F259 Schizoaffective disorder, unspecified: Secondary | ICD-10-CM

## 2019-03-10 DIAGNOSIS — D539 Nutritional anemia, unspecified: Secondary | ICD-10-CM

## 2019-03-10 DIAGNOSIS — Q048 Other specified congenital malformations of brain: Secondary | ICD-10-CM

## 2019-03-10 DIAGNOSIS — K219 Gastro-esophageal reflux disease without esophagitis: Secondary | ICD-10-CM

## 2019-03-10 DIAGNOSIS — M1990 Unspecified osteoarthritis, unspecified site: Secondary | ICD-10-CM

## 2019-03-10 DIAGNOSIS — G40909 Epilepsy, unspecified, not intractable, without status epilepticus: Secondary | ICD-10-CM

## 2019-03-10 DIAGNOSIS — E669 Obesity, unspecified: Secondary | ICD-10-CM

## 2019-03-10 DIAGNOSIS — E785 Hyperlipidemia, unspecified: Secondary | ICD-10-CM

## 2019-03-10 DIAGNOSIS — Z79899 Other long term (current) drug therapy: Secondary | ICD-10-CM

## 2019-03-11 ENCOUNTER — Ambulatory Visit
Admission: RE | Admit: 2019-03-11 | Discharge: 2019-03-11 | Disposition: A | Discharge status: Home / Self Care | Payer: Medicare Other | Source: Ambulatory Visit | Attending: Family Medicine | Admitting: Family Medicine

## 2019-03-11 DIAGNOSIS — Z1239 Encounter for other screening for malignant neoplasm of breast: Secondary | ICD-10-CM

## 2019-03-11 DIAGNOSIS — Z1231 Encounter for screening mammogram for malignant neoplasm of breast: Secondary | ICD-10-CM | POA: Diagnosis not present

## 2019-03-28 ENCOUNTER — Other Ambulatory Visit: Payer: Self-pay | Admitting: Family Medicine

## 2019-03-28 DIAGNOSIS — E039 Hypothyroidism, unspecified: Secondary | ICD-10-CM

## 2019-04-11 DIAGNOSIS — E785 Hyperlipidemia, unspecified: Secondary | ICD-10-CM | POA: Diagnosis not present

## 2019-04-11 DIAGNOSIS — Q048 Other specified congenital malformations of brain: Secondary | ICD-10-CM | POA: Diagnosis not present

## 2019-04-11 DIAGNOSIS — I129 Hypertensive chronic kidney disease with stage 1 through stage 4 chronic kidney disease, or unspecified chronic kidney disease: Secondary | ICD-10-CM | POA: Diagnosis not present

## 2019-04-11 DIAGNOSIS — E039 Hypothyroidism, unspecified: Secondary | ICD-10-CM | POA: Diagnosis not present

## 2019-04-11 DIAGNOSIS — N183 Chronic kidney disease, stage 3 unspecified: Secondary | ICD-10-CM | POA: Diagnosis not present

## 2019-04-11 DIAGNOSIS — D631 Anemia in chronic kidney disease: Secondary | ICD-10-CM | POA: Diagnosis not present

## 2019-04-11 DIAGNOSIS — M1990 Unspecified osteoarthritis, unspecified site: Secondary | ICD-10-CM | POA: Diagnosis not present

## 2019-04-11 DIAGNOSIS — R296 Repeated falls: Secondary | ICD-10-CM | POA: Diagnosis not present

## 2019-04-11 DIAGNOSIS — D539 Nutritional anemia, unspecified: Secondary | ICD-10-CM | POA: Diagnosis not present

## 2019-04-11 DIAGNOSIS — I739 Peripheral vascular disease, unspecified: Secondary | ICD-10-CM | POA: Diagnosis not present

## 2019-04-11 DIAGNOSIS — K219 Gastro-esophageal reflux disease without esophagitis: Secondary | ICD-10-CM | POA: Diagnosis not present

## 2019-04-12 ENCOUNTER — Other Ambulatory Visit: Payer: Self-pay | Admitting: Family Medicine

## 2019-04-12 DIAGNOSIS — E039 Hypothyroidism, unspecified: Secondary | ICD-10-CM

## 2019-04-12 DIAGNOSIS — I10 Essential (primary) hypertension: Secondary | ICD-10-CM

## 2019-04-12 DIAGNOSIS — L03311 Cellulitis of abdominal wall: Secondary | ICD-10-CM

## 2019-04-12 DIAGNOSIS — F259 Schizoaffective disorder, unspecified: Secondary | ICD-10-CM

## 2019-04-12 DIAGNOSIS — K219 Gastro-esophageal reflux disease without esophagitis: Secondary | ICD-10-CM

## 2019-04-28 ENCOUNTER — Other Ambulatory Visit: Payer: Self-pay | Admitting: Family Medicine

## 2019-04-28 DIAGNOSIS — E785 Hyperlipidemia, unspecified: Secondary | ICD-10-CM

## 2019-04-28 DIAGNOSIS — J309 Allergic rhinitis, unspecified: Secondary | ICD-10-CM

## 2019-04-28 DIAGNOSIS — F259 Schizoaffective disorder, unspecified: Secondary | ICD-10-CM

## 2019-04-28 NOTE — Telephone Encounter (Signed)
This isn't a drug I'm too familiar with so wanted to double check with you if ok to refill. Last OV with you for routine f/u was 10/2018 and she doesn't have a f/u scheduled.

## 2019-05-13 ENCOUNTER — Other Ambulatory Visit: Payer: Self-pay | Admitting: Family Medicine

## 2019-05-13 DIAGNOSIS — E039 Hypothyroidism, unspecified: Secondary | ICD-10-CM

## 2019-05-13 DIAGNOSIS — L03311 Cellulitis of abdominal wall: Secondary | ICD-10-CM

## 2019-05-23 ENCOUNTER — Other Ambulatory Visit: Payer: Self-pay

## 2019-05-23 ENCOUNTER — Ambulatory Visit (INDEPENDENT_AMBULATORY_CARE_PROVIDER_SITE_OTHER): Payer: Medicare Other | Admitting: Family Medicine

## 2019-05-23 ENCOUNTER — Encounter: Payer: Self-pay | Admitting: Family Medicine

## 2019-05-23 VITALS — BP 129/67 | HR 64 | Temp 98.4°F | Ht 61.0 in | Wt 207.6 lb

## 2019-05-23 DIAGNOSIS — I872 Venous insufficiency (chronic) (peripheral): Secondary | ICD-10-CM

## 2019-05-23 DIAGNOSIS — F259 Schizoaffective disorder, unspecified: Secondary | ICD-10-CM

## 2019-05-23 DIAGNOSIS — G9389 Other specified disorders of brain: Secondary | ICD-10-CM

## 2019-05-23 DIAGNOSIS — L02419 Cutaneous abscess of limb, unspecified: Secondary | ICD-10-CM

## 2019-05-23 DIAGNOSIS — R600 Localized edema: Secondary | ICD-10-CM | POA: Diagnosis not present

## 2019-05-23 DIAGNOSIS — L03119 Cellulitis of unspecified part of limb: Secondary | ICD-10-CM | POA: Diagnosis not present

## 2019-05-23 DIAGNOSIS — L308 Other specified dermatitis: Secondary | ICD-10-CM

## 2019-05-23 DIAGNOSIS — E039 Hypothyroidism, unspecified: Secondary | ICD-10-CM

## 2019-05-23 DIAGNOSIS — L299 Pruritus, unspecified: Secondary | ICD-10-CM

## 2019-05-23 MED ORDER — AMOXICILLIN-POT CLAVULANATE 875-125 MG PO TABS: 1.0000 | ORAL_TABLET | Freq: Two times a day (BID) | ORAL | 0 refills | Status: DC

## 2019-05-23 MED ORDER — CETIRIZINE HCL 10 MG PO TABS: 10.0000 mg | ORAL_TABLET | Freq: Every day | ORAL | 3 refills | Status: DC

## 2019-05-23 NOTE — Progress Notes (Signed)
Subjective:  Patient ID: Pam Sanders, female    DOB: [birth year]  Age: 69 y.o. MRN: [id number]  CC: No chief complaint on file.   HPI Pam Sanders presents for itching all over uncontrollably.  Her sister is with her.  She gets most of the history.  The patient is able to answer some brief straightforward questions.  She has been disabled from birth.  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  1 result of this is the diagnosis of schizophrenia.  She takes Mellaril for this.  She is also had a seizure disorder for which she takes Lamictal.  The sister gives most of the history today.  The patient does say that she cannot stop herself from the itching it so bad.  The legs gotten swollen and red and the itching is severe in that area and she is scratched until she is had bleeding.  She has had no fever chills or sweats to the knowledge of the sister.  None was reported by the caregiver who lives with her 5 days a week during the day.   Depression screen [facility] 2/9 05/23/2019 02/23/2019 01/12/2019  Decreased Interest 0 0 0  Down, Depressed, Hopeless 0 0 0  PHQ - 2 Score 0 0 0    History Teren has a past medical history of Gait disturbance, GERD (gastroesophageal reflux disease), HA (headache), Hernia of abdominal wall, Hypertension, Lower extremity edema, Seizures ([place]), Thyroid disease, and Tremor.   She has a past surgical history that includes Stomach surgery and Cataract extraction, bilateral.   Her family history includes Aneurysm in her brother and sister; Diabetes in an other family member; Emphysema in her brother and brother; Heart Problems in an other family member; Heart disease in her brother; Hypertension in her sister; Other in her brother, brother, and sister; Stroke in her brother, father, mother, and another family member; Throat cancer in her brother; Thyroid cancer in her sister.She reports that she has never smoked. She has never used smokeless tobacco. She reports that  she does not drink alcohol or use drugs.    ROS Review of Systems  Constitutional: Negative.   HENT: Negative.   Eyes: Negative for visual disturbance.  Respiratory: Negative for shortness of breath.   Cardiovascular: Positive for leg swelling. Negative for chest pain.  Gastrointestinal: Negative for abdominal pain.  Musculoskeletal: Negative for arthralgias.  Skin: Positive for rash and wound.    Objective:  BP 129/67   Pulse 64   Temp 98.4 F (36.9 C) (Temporal)   Ht 5' 1"  (1.549 m)   Wt 207 lb 9.6 oz (94.2 kg)   BMI 39.23 kg/m   BP Readings from Last 3 Encounters:  05/23/19 129/67  01/25/19 113/75  01/21/19 (!) 118/57    Wt Readings from Last 3 Encounters:  05/23/19 207 lb 9.6 oz (94.2 kg)  01/25/19 219 lb (99.3 kg)  01/21/19 222 lb (100.7 kg)     Physical Exam Constitutional:      General: She is not in acute distress.    Appearance: She is well-developed.  HENT:     Head: Normocephalic and atraumatic.  Eyes:     Conjunctiva/sclera: Conjunctivae normal.     Pupils: Pupils are equal, round, and reactive to light.  Neck:     Thyroid: No thyromegaly.  Cardiovascular:     Rate and Rhythm: Normal rate and regular rhythm.     Heart sounds: Normal heart sounds. No murmur.  Pulmonary:  Effort: Pulmonary effort is normal. No respiratory distress.     Breath sounds: Normal breath sounds. No wheezing or rales.  Abdominal:     General: Bowel sounds are normal. There is no distension.     Palpations: Abdomen is soft.     Tenderness: There is no abdominal tenderness.  Musculoskeletal:        General: Normal range of motion.     Cervical back: Normal range of motion and neck supple.  Lymphadenopathy:     Cervical: No cervical adenopathy.  Skin:    General: Skin is warm and dry.     Findings: Erythema (  There is widespread erythema with multiple linear excoriations running up and down the legs from the calves to the ankles.  There is 2+ pretibial edema.)  present.  Neurological:     Mental Status: She is alert and oriented to person, place, and time.  Psychiatric:        Behavior: Behavior normal.        Thought Content: Thought content normal.        Judgment: Judgment normal.       Assessment & Plan:   Diagnoses and all orders for this visit:  Lower extremity edema -     CMP14+EGFR -     CBC with Differential/Platelet  Schizo-affective schizophrenia, chronic condition (HCC) -     CMP14+EGFR -     CBC with Differential/Platelet  Cerebral ventriculomegaly -     CMP14+EGFR -     CBC with Differential/Platelet  Stasis dermatitis of both legs -     CMP14+EGFR -     CBC with Differential/Platelet -     amoxicillin-clavulanate (AUGMENTIN) 875-125 MG tablet; Take 1 tablet by mouth 2 (two) times daily. Take all of this medication  Cellulitis and abscess of lower extremity -     CMP14+EGFR -     CBC with Differential/Platelet -     amoxicillin-clavulanate (AUGMENTIN) 875-125 MG tablet; Take 1 tablet by mouth 2 (two) times daily. Take all of this medication  Hypothyroidism, unspecified type -     CMP14+EGFR -     CBC with Differential/Platelet -     TSH + free T4  Pruritic dermatitis -     cetirizine (ZYRTEC) 10 MG tablet; Take 1 tablet (10 mg total) by mouth daily. For allergy symptoms       I am having Pam Sanders start on amoxicillin-clavulanate and cetirizine. I am also having her maintain her Vitamin D-3, Fish Oil, multivitamin with minerals, alum & mag hydroxide-simeth, topiramate, Melatonin, lamoTRIgine, Olopatadine HCl, Clotrimazole, zolpidem, omeprazole, Linzess, famotidine, losartan, potassium chloride SA, furosemide, sertraline, spironolactone, fluticasone, thioridazine, atorvastatin, loratadine, and levothyroxine.  Allergies as of 05/23/2019   No Known Allergies     Medication List       Accurate as of May 23, 2019  6:26 PM. If you have any questions, ask your nurse or doctor.        alum &  mag hydroxide-simeth 200-200-20 MG/5ML suspension Commonly known as: MAALOX/MYLANTA Take 30 mLs by mouth every 6 (six) hours as needed for indigestion or heartburn.   amoxicillin-clavulanate 875-125 MG tablet Commonly known as: AUGMENTIN Take 1 tablet by mouth 2 (two) times daily. Take all of this medication Started by: Claretta Fraise, MD   atorvastatin 10 MG tablet Commonly known as: LIPITOR TAKE 1 TABLET DAILY   cetirizine 10 MG tablet Commonly known as: ZYRTEC Take 1 tablet (10 mg total) by mouth daily. For  allergy symptoms Started by: Claretta Fraise, MD   Clotrimazole 1 % Oint Apply 1 application topically 2 (two) times daily.   famotidine 20 MG tablet Commonly known as: PEPCID Take 1 tablet (20 mg total) by mouth at bedtime.   Fish Oil 1000 MG Caps Take 1 capsule by mouth daily.   fluticasone 50 MCG/ACT nasal spray Commonly known as: FLONASE Place 2 sprays into both nostrils at bedtime.   furosemide 20 MG tablet Commonly known as: LASIX TAKE 1 TABLET DAILY   lamoTRIgine 100 MG tablet Commonly known as: LaMICtal Take 1 tablet (100 mg total) by mouth 2 (two) times daily.   levothyroxine 150 MCG tablet Commonly known as: SYNTHROID Take 1 tablet (150 mcg total) by mouth daily before breakfast. (needs labwork)   Linzess 72 MCG capsule Generic drug: linaclotide TAKE 1 CAPSULE DAILY BEFORE BREAKFAST   loratadine 10 MG tablet Commonly known as: CLARITIN Take 1 tablet (10 mg total) by mouth daily.   losartan 100 MG tablet Commonly known as: COZAAR Take 1 tablet (100 mg total) by mouth daily.   Melatonin 5 MG Caps Take 5 mg by mouth at bedtime.   multivitamin with minerals Tabs tablet Take 1 tablet by mouth daily.   Olopatadine HCl 0.2 % Soln Apply 1 drop to eye daily.   omeprazole 20 MG capsule Commonly known as: PRILOSEC TAKE 1 CAPSULE 2 TIMES A DAY BEFORE A MEAL   potassium chloride SA 20 MEQ tablet Commonly known as: KLOR-CON TAKE  (1)  TABLET TWICE  A DAY.   sertraline 50 MG tablet Commonly known as: ZOLOFT Take 1 tablet (50 mg total) by mouth daily.   spironolactone 25 MG tablet Commonly known as: ALDACTONE Take 1 tablet (25 mg total) by mouth daily.   thioridazine 50 MG tablet Commonly known as: MELLARIL TAKE ONE TABLET AT BEDTIME   topiramate 100 MG tablet Commonly known as: TOPAMAX Take 1 tablet (100 mg total) by mouth 2 (two) times daily.   Vitamin D-3 25 MCG (1000 UT) Caps Take 1,000 Units by mouth daily.   zolpidem 5 MG tablet Commonly known as: AMBIEN Take 1 tablet (5 mg total) by mouth at bedtime.        Follow-up: Return in about 2 weeks (around 06/06/2019).  Claretta Fraise, M.D.

## 2019-05-24 LAB — CBC WITH DIFFERENTIAL/PLATELET
Basophils Absolute: 0 10*3/uL (ref 0.0–0.2)
Basos: 0 %
EOS (ABSOLUTE): 0.7 10*3/uL — ABNORMAL HIGH (ref 0.0–0.4)
Eos: 7 %
Hematocrit: 35.3 % (ref 34.0–46.6)
Hemoglobin: 11.8 g/dL (ref 11.1–15.9)
Immature Grans (Abs): 0 10*3/uL (ref 0.0–0.1)
Immature Granulocytes: 0 %
Lymphocytes Absolute: 1.5 10*3/uL (ref 0.7–3.1)
Lymphs: 14 %
MCH: 32 pg (ref 26.6–33.0)
MCHC: 33.4 g/dL (ref 31.5–35.7)
MCV: 96 fL (ref 79–97)
Monocytes Absolute: 1.1 10*3/uL — ABNORMAL HIGH (ref 0.1–0.9)
Monocytes: 10 %
Neutrophils Absolute: 7.1 10*3/uL — ABNORMAL HIGH (ref 1.4–7.0)
Neutrophils: 69 %
Platelets: 204 10*3/uL (ref 150–450)
RBC: 3.69 x10E6/uL — ABNORMAL LOW (ref 3.77–5.28)
RDW: 12.9 % (ref 11.7–15.4)
WBC: 10.5 10*3/uL (ref 3.4–10.8)

## 2019-05-24 LAB — TSH+FREE T4
Free T4: 1.48 ng/dL (ref 0.82–1.77)
TSH: 0.338 u[IU]/mL — ABNORMAL LOW (ref 0.450–4.500)

## 2019-05-24 LAB — CMP14+EGFR
ALT: 8 IU/L (ref 0–32)
AST: 16 IU/L (ref 0–40)
Albumin/Globulin Ratio: 1.4 (ref 1.2–2.2)
Albumin: 3.9 g/dL (ref 3.8–4.8)
Alkaline Phosphatase: 119 IU/L — ABNORMAL HIGH (ref 39–117)
BUN/Creatinine Ratio: 16 (ref 12–28)
BUN: 22 mg/dL (ref 8–27)
Bilirubin Total: 0.3 mg/dL (ref 0.0–1.2)
CO2: 22 mmol/L (ref 20–29)
Calcium: 9.2 mg/dL (ref 8.7–10.3)
Chloride: 95 mmol/L — ABNORMAL LOW (ref 96–106)
Creatinine, Ser: 1.35 mg/dL — ABNORMAL HIGH (ref 0.57–1.00)
GFR calc Af Amer: 47 mL/min/{1.73_m2} — ABNORMAL LOW (ref >59)
GFR calc non Af Amer: 40 mL/min/{1.73_m2} — ABNORMAL LOW (ref >59)
Globulin, Total: 2.7 g/dL (ref 1.5–4.5)
Glucose: 96 mg/dL (ref 65–99)
Potassium: 5 mmol/L (ref 3.5–5.2)
Sodium: 132 mmol/L — ABNORMAL LOW (ref 134–144)
Total Protein: 6.6 g/dL (ref 6.0–8.5)

## 2019-05-26 ENCOUNTER — Other Ambulatory Visit: Payer: Self-pay | Admitting: Family Medicine

## 2019-05-26 DIAGNOSIS — J309 Allergic rhinitis, unspecified: Secondary | ICD-10-CM

## 2019-06-06 DIAGNOSIS — N1832 Chronic kidney disease, stage 3b: Secondary | ICD-10-CM | POA: Diagnosis not present

## 2019-06-06 DIAGNOSIS — I129 Hypertensive chronic kidney disease with stage 1 through stage 4 chronic kidney disease, or unspecified chronic kidney disease: Secondary | ICD-10-CM | POA: Diagnosis not present

## 2019-06-10 DIAGNOSIS — K219 Gastro-esophageal reflux disease without esophagitis: Secondary | ICD-10-CM | POA: Diagnosis not present

## 2019-06-10 DIAGNOSIS — M1990 Unspecified osteoarthritis, unspecified site: Secondary | ICD-10-CM | POA: Diagnosis not present

## 2019-06-10 DIAGNOSIS — Q048 Other specified congenital malformations of brain: Secondary | ICD-10-CM | POA: Diagnosis not present

## 2019-06-10 DIAGNOSIS — N183 Chronic kidney disease, stage 3 unspecified: Secondary | ICD-10-CM | POA: Diagnosis not present

## 2019-06-10 DIAGNOSIS — D631 Anemia in chronic kidney disease: Secondary | ICD-10-CM | POA: Diagnosis not present

## 2019-06-10 DIAGNOSIS — D539 Nutritional anemia, unspecified: Secondary | ICD-10-CM | POA: Diagnosis not present

## 2019-06-10 DIAGNOSIS — I739 Peripheral vascular disease, unspecified: Secondary | ICD-10-CM | POA: Diagnosis not present

## 2019-06-10 DIAGNOSIS — E039 Hypothyroidism, unspecified: Secondary | ICD-10-CM | POA: Diagnosis not present

## 2019-06-10 DIAGNOSIS — E785 Hyperlipidemia, unspecified: Secondary | ICD-10-CM | POA: Diagnosis not present

## 2019-06-10 DIAGNOSIS — I129 Hypertensive chronic kidney disease with stage 1 through stage 4 chronic kidney disease, or unspecified chronic kidney disease: Secondary | ICD-10-CM | POA: Diagnosis not present

## 2019-06-13 ENCOUNTER — Other Ambulatory Visit: Payer: Self-pay | Admitting: Family Medicine

## 2019-06-13 DIAGNOSIS — E039 Hypothyroidism, unspecified: Secondary | ICD-10-CM

## 2019-06-15 NOTE — Telephone Encounter (Signed)
Please let patient's sister know that I have decreased the dosage of her levothyroxine from 150 mcg to 137 mcg.  She had lab work completed a couple of weeks ago by another provider in the office at which time her TSH was too low indicating that she is getting too much levothyroxine.  She needs to return in 6 to 8 weeks to have this level rechecked.

## 2019-06-21 ENCOUNTER — Ambulatory Visit (INDEPENDENT_AMBULATORY_CARE_PROVIDER_SITE_OTHER): Payer: Medicare Other

## 2019-06-21 ENCOUNTER — Other Ambulatory Visit: Payer: Self-pay

## 2019-06-21 DIAGNOSIS — I129 Hypertensive chronic kidney disease with stage 1 through stage 4 chronic kidney disease, or unspecified chronic kidney disease: Secondary | ICD-10-CM | POA: Diagnosis not present

## 2019-06-21 DIAGNOSIS — I739 Peripheral vascular disease, unspecified: Secondary | ICD-10-CM

## 2019-06-21 DIAGNOSIS — E785 Hyperlipidemia, unspecified: Secondary | ICD-10-CM

## 2019-06-21 DIAGNOSIS — E039 Hypothyroidism, unspecified: Secondary | ICD-10-CM

## 2019-06-21 DIAGNOSIS — D631 Anemia in chronic kidney disease: Secondary | ICD-10-CM

## 2019-06-21 DIAGNOSIS — Q048 Other specified congenital malformations of brain: Secondary | ICD-10-CM

## 2019-06-21 DIAGNOSIS — G40909 Epilepsy, unspecified, not intractable, without status epilepticus: Secondary | ICD-10-CM

## 2019-06-21 DIAGNOSIS — N183 Chronic kidney disease, stage 3 unspecified: Secondary | ICD-10-CM | POA: Diagnosis not present

## 2019-06-21 DIAGNOSIS — Z9181 History of falling: Secondary | ICD-10-CM

## 2019-06-21 DIAGNOSIS — D539 Nutritional anemia, unspecified: Secondary | ICD-10-CM

## 2019-06-21 DIAGNOSIS — F259 Schizoaffective disorder, unspecified: Secondary | ICD-10-CM

## 2019-06-21 DIAGNOSIS — K219 Gastro-esophageal reflux disease without esophagitis: Secondary | ICD-10-CM

## 2019-06-21 DIAGNOSIS — M1990 Unspecified osteoarthritis, unspecified site: Secondary | ICD-10-CM

## 2019-06-21 DIAGNOSIS — E669 Obesity, unspecified: Secondary | ICD-10-CM

## 2019-06-25 ENCOUNTER — Other Ambulatory Visit: Payer: Self-pay | Admitting: Family Medicine

## 2019-06-25 DIAGNOSIS — J309 Allergic rhinitis, unspecified: Secondary | ICD-10-CM

## 2019-07-05 ENCOUNTER — Ambulatory Visit: Payer: Medicare Other | Admitting: Nurse Practitioner

## 2019-07-11 ENCOUNTER — Other Ambulatory Visit: Payer: Self-pay | Admitting: Family Medicine

## 2019-07-11 DIAGNOSIS — I10 Essential (primary) hypertension: Secondary | ICD-10-CM

## 2019-07-11 DIAGNOSIS — F259 Schizoaffective disorder, unspecified: Secondary | ICD-10-CM

## 2019-07-11 DIAGNOSIS — K219 Gastro-esophageal reflux disease without esophagitis: Secondary | ICD-10-CM

## 2019-07-27 ENCOUNTER — Other Ambulatory Visit: Payer: Self-pay | Admitting: Family Medicine

## 2019-07-27 DIAGNOSIS — E785 Hyperlipidemia, unspecified: Secondary | ICD-10-CM

## 2019-08-08 DIAGNOSIS — M1990 Unspecified osteoarthritis, unspecified site: Secondary | ICD-10-CM | POA: Diagnosis not present

## 2019-08-08 DIAGNOSIS — Q048 Other specified congenital malformations of brain: Secondary | ICD-10-CM | POA: Diagnosis not present

## 2019-08-08 DIAGNOSIS — E039 Hypothyroidism, unspecified: Secondary | ICD-10-CM | POA: Diagnosis not present

## 2019-08-08 DIAGNOSIS — I739 Peripheral vascular disease, unspecified: Secondary | ICD-10-CM | POA: Diagnosis not present

## 2019-08-08 DIAGNOSIS — N183 Chronic kidney disease, stage 3 unspecified: Secondary | ICD-10-CM | POA: Diagnosis not present

## 2019-08-08 DIAGNOSIS — D631 Anemia in chronic kidney disease: Secondary | ICD-10-CM | POA: Diagnosis not present

## 2019-08-08 DIAGNOSIS — D539 Nutritional anemia, unspecified: Secondary | ICD-10-CM | POA: Diagnosis not present

## 2019-08-08 DIAGNOSIS — K219 Gastro-esophageal reflux disease without esophagitis: Secondary | ICD-10-CM | POA: Diagnosis not present

## 2019-08-08 DIAGNOSIS — E785 Hyperlipidemia, unspecified: Secondary | ICD-10-CM | POA: Diagnosis not present

## 2019-08-08 DIAGNOSIS — I129 Hypertensive chronic kidney disease with stage 1 through stage 4 chronic kidney disease, or unspecified chronic kidney disease: Secondary | ICD-10-CM | POA: Diagnosis not present

## 2019-08-10 ENCOUNTER — Other Ambulatory Visit: Payer: Self-pay | Admitting: Family Medicine

## 2019-08-10 DIAGNOSIS — E039 Hypothyroidism, unspecified: Secondary | ICD-10-CM

## 2019-08-26 ENCOUNTER — Other Ambulatory Visit: Payer: Self-pay | Admitting: Family Medicine

## 2019-08-26 ENCOUNTER — Other Ambulatory Visit: Payer: Self-pay | Admitting: Nurse Practitioner

## 2019-08-26 DIAGNOSIS — K219 Gastro-esophageal reflux disease without esophagitis: Secondary | ICD-10-CM

## 2019-08-26 DIAGNOSIS — K5900 Constipation, unspecified: Secondary | ICD-10-CM

## 2019-09-06 ENCOUNTER — Other Ambulatory Visit: Payer: Self-pay

## 2019-09-06 ENCOUNTER — Encounter: Payer: Self-pay | Admitting: Family Medicine

## 2019-09-06 ENCOUNTER — Ambulatory Visit (INDEPENDENT_AMBULATORY_CARE_PROVIDER_SITE_OTHER): Payer: Medicaid Other | Admitting: Family Medicine

## 2019-09-06 VITALS — BP 125/61 | HR 58 | Temp 97.6°F | Ht 61.0 in | Wt 198.4 lb

## 2019-09-06 DIAGNOSIS — I739 Peripheral vascular disease, unspecified: Secondary | ICD-10-CM | POA: Diagnosis not present

## 2019-09-06 DIAGNOSIS — R600 Localized edema: Secondary | ICD-10-CM

## 2019-09-06 DIAGNOSIS — R296 Repeated falls: Secondary | ICD-10-CM

## 2019-09-06 DIAGNOSIS — K439 Ventral hernia without obstruction or gangrene: Secondary | ICD-10-CM

## 2019-09-06 DIAGNOSIS — E039 Hypothyroidism, unspecified: Secondary | ICD-10-CM

## 2019-09-06 DIAGNOSIS — G479 Sleep disorder, unspecified: Secondary | ICD-10-CM

## 2019-09-06 DIAGNOSIS — F79 Unspecified intellectual disabilities: Secondary | ICD-10-CM

## 2019-09-06 DIAGNOSIS — G40401 Other generalized epilepsy and epileptic syndromes, not intractable, with status epilepticus: Secondary | ICD-10-CM

## 2019-09-06 DIAGNOSIS — E785 Hyperlipidemia, unspecified: Secondary | ICD-10-CM

## 2019-09-06 DIAGNOSIS — I10 Essential (primary) hypertension: Secondary | ICD-10-CM

## 2019-09-06 DIAGNOSIS — H900 Conductive hearing loss, bilateral: Secondary | ICD-10-CM

## 2019-09-06 DIAGNOSIS — R2681 Unsteadiness on feet: Secondary | ICD-10-CM

## 2019-09-06 DIAGNOSIS — E669 Obesity, unspecified: Secondary | ICD-10-CM

## 2019-09-06 DIAGNOSIS — N1832 Chronic kidney disease, stage 3b: Secondary | ICD-10-CM

## 2019-09-06 DIAGNOSIS — J309 Allergic rhinitis, unspecified: Secondary | ICD-10-CM

## 2019-09-06 DIAGNOSIS — K219 Gastro-esophageal reflux disease without esophagitis: Secondary | ICD-10-CM | POA: Diagnosis not present

## 2019-09-06 DIAGNOSIS — F259 Schizoaffective disorder, unspecified: Secondary | ICD-10-CM

## 2019-09-06 MED ORDER — FAMOTIDINE 20 MG PO TABS: 20.0000 mg | ORAL_TABLET | Freq: Every day | ORAL | 1 refills | Status: DC

## 2019-09-06 MED ORDER — ZOLPIDEM TARTRATE 5 MG PO TABS: 5.0000 mg | ORAL_TABLET | Freq: Every day | ORAL | 5 refills | Status: DC

## 2019-09-06 MED ORDER — ATORVASTATIN CALCIUM 10 MG PO TABS: 10.0000 mg | ORAL_TABLET | Freq: Every day | ORAL | 1 refills | Status: DC

## 2019-09-06 MED ORDER — TOPIRAMATE 100 MG PO TABS: 100.0000 mg | ORAL_TABLET | Freq: Two times a day (BID) | ORAL | 1 refills | Status: DC

## 2019-09-06 MED ORDER — SERTRALINE HCL 50 MG PO TABS: 50.0000 mg | ORAL_TABLET | Freq: Every day | ORAL | 1 refills | Status: DC

## 2019-09-06 MED ORDER — FUROSEMIDE 20 MG PO TABS: 20.0000 mg | ORAL_TABLET | Freq: Every day | ORAL | 1 refills | Status: DC

## 2019-09-06 MED ORDER — LORATADINE 10 MG PO TABS: 10.0000 mg | ORAL_TABLET | Freq: Every day | ORAL | 1 refills | Status: DC

## 2019-09-06 MED ORDER — THIORIDAZINE HCL 50 MG PO TABS: 50.0000 mg | ORAL_TABLET | Freq: Every day | ORAL | 1 refills | Status: DC

## 2019-09-06 MED ORDER — LAMOTRIGINE 100 MG PO TABS: 100.0000 mg | ORAL_TABLET | Freq: Two times a day (BID) | ORAL | 1 refills | Status: DC

## 2019-09-06 MED ORDER — LOSARTAN POTASSIUM 100 MG PO TABS: 100.0000 mg | ORAL_TABLET | Freq: Every day | ORAL | 1 refills | Status: DC

## 2019-09-06 NOTE — Progress Notes (Signed)
Assessment & Plan:  1. Essential hypertension - Well controlled on current regimen.  - furosemide (LASIX) 20 MG tablet; Take 1 tablet (20 mg total) by mouth daily.  Dispense: 90 tablet; Refill: 1 - losartan (COZAAR) 100 MG tablet; Take 1 tablet (100 mg total) by mouth daily.  Dispense: 90 tablet; Refill: 1 - CMP14+EGFR  2. Dyslipidemia - Well controlled on current regimen. - atorvastatin (LIPITOR) 10 MG tablet; Take 1 tablet (10 mg total) by mouth daily.  Dispense: 90 tablet; Refill: 1 - CMP14+EGFR  3. Peripheral vascular disease ([place]) - Controlled with statin.  4. Hypothyroidism, unspecified type - Abnormal TSH in February. Lab today to assess for need of dosage adjustment.  - TSH  5. Gastroesophageal reflux disease without esophagitis - Well controlled on current regimen.  - famotidine (PEPCID) 20 MG tablet; Take 1 tablet (20 mg total) by mouth at bedtime.  Dispense: 90 tablet; Refill: 1 - CMP14+EGFR  6. Schizo-affective schizophrenia, chronic condition ([place]) - Well controlled on current regimen.  - thioridazine (MELLARIL) 50 MG tablet; Take 1 tablet (50 mg total) by mouth at bedtime.  Dispense: 90 tablet; Refill: 1 - sertraline (ZOLOFT) 50 MG tablet; Take 1 tablet (50 mg total) by mouth daily.  Dispense: 90 tablet; Refill: 1 - CMP14+EGFR  7. Epileptic grand mal status ([place]) - Well controlled on current regimen.  - lamoTRIgine (LAMICTAL) 100 MG tablet; Take 1 tablet (100 mg total) by mouth 2 (two) times daily.  Dispense: 180 tablet; Refill: 1 - topiramate (TOPAMAX) 100 MG tablet; Take 1 tablet (100 mg total) by mouth 2 (two) times daily.  Dispense: 180 tablet; Refill: 1 - Lamotrigine level - CMP14+EGFR  8. Difficulty sleeping - Well controlled on current regimen.  - zolpidem (AMBIEN) 5 MG tablet; Take 1 tablet (5 mg total) by mouth at bedtime.  Dispense: 30 tablet; Refill: 5 - CMP14+EGFR  9. Hernia of abdominal wall - Not a surgical candidate.   10. Lower extremity  edema - Greatly improved with spironolactone.  11. Obesity (BMI 30-39.9) - Patient is eating smaller portions and walking more; encouraged to continue. She is losing weight.   12. Recurrent falls - Discussed fall hazards and adjustments to be made around the house.   13. Allergic rhinitis, unspecified seasonality, unspecified trigger - Well controlled on current regimen.   14. Conductive hearing loss, bilateral - Controlled with hearing aids.   15. Stage 3b chronic kidney disease - Managed by nephrologist.   16. Intellectual disability - Patient lives alone but has caregivers during the day and her sister does a lot for her as well.   17. Gait instability - Ambulating with rolling walker.    Return in about 6 months (around 03/07/2020) for annual physical.  Pam Limes, MSN, APRN, FNP-C [facility]  Subjective:    Patient ID: Pam Sanders, female    DOB: 06/19/1950, 69 y.o.   MRN: [id number]  Patient Care Team: Loman Brooklyn, FNP as PCP - General (Family Medicine) Pam Binder, MD (Inactive) as Consulting Physician (Gastroenterology) Pam Pacas, MD as Consulting Physician (Neurology)   Chief Complaint:  Chief Complaint  Patient presents with   Hypertension    check up of chronic medical conditions   Knee Pain    right.  Patient states she had a fall yesterday and has been having right knee pain since the fall.    HPI: Pam Sanders is a 69 y.o. female presenting on 09/06/2019 for Hypertension (check up  of chronic medical conditions) and Knee Pain (right.  Patient states she had a fall yesterday and has been having right knee pain since the fall.)  Patient did go see Dr. Constance Haw (general surgeon) for the abdominal hernia and reports she is not a surgical candidate for hernia repair.    New complaints: Patient reports she had a fall yesterday due to her house slippers. She walks on the back of them instead of pulling them over  her heel. She bruised her left knee during the fall. She denies any difficulty with ambulation.    Social history:  Relevant past medical, surgical, family and social history reviewed and updated as indicated. Interim medical history since our last visit reviewed.  Allergies and medications reviewed and updated.  DATA REVIEWED: CHART IN EPIC  ROS: Negative unless specifically indicated above in HPI.    Current Outpatient Medications:    alum & mag hydroxide-simeth (MAALOX/MYLANTA) 200-200-20 MG/5ML suspension, Take 30 mLs by mouth every 6 (six) hours as needed for indigestion or heartburn., Disp: , Rfl:    atorvastatin (LIPITOR) 10 MG tablet, Take 1 tablet (10 mg total) by mouth daily., Disp: 90 tablet, Rfl: 1   cetirizine (ZYRTEC) 10 MG tablet, Take 1 tablet (10 mg total) by mouth daily. For allergy symptoms, Disp: 90 tablet, Rfl: 3   Cholecalciferol (VITAMIN D-3) 1000 UNITS CAPS, Take 1,000 Units by mouth daily. , Disp: , Rfl:    Clotrimazole 1 % OINT, Apply 1 application topically 2 (two) times daily., Disp: 56.7 g, Rfl: 0   famotidine (PEPCID) 20 MG tablet, Take 1 tablet (20 mg total) by mouth at bedtime., Disp: 90 tablet, Rfl: 1   fluticasone (FLONASE) 50 MCG/ACT nasal spray, Place 2 sprays into both nostrils at bedtime., Disp: 16 g, Rfl: 5   furosemide (LASIX) 20 MG tablet, Take 1 tablet (20 mg total) by mouth daily., Disp: 90 tablet, Rfl: 1   lamoTRIgine (LAMICTAL) 100 MG tablet, Take 1 tablet (100 mg total) by mouth 2 (two) times daily., Disp: 180 tablet, Rfl: 1   levothyroxine (SYNTHROID) 137 MCG tablet, Take 1 tablet (137 mcg total) by mouth daily before breakfast. (Needs repeat labwork), Disp: 30 tablet, Rfl: 0   LINZESS 72 MCG capsule, TAKE 1 CAPSULE DAILY BEFORE BREAKFAST, Disp: 30 capsule, Rfl: 5   loratadine (CLARITIN) 10 MG tablet, Take 1 tablet (10 mg total) by mouth daily., Disp: 90 tablet, Rfl: 1   losartan (COZAAR) 100 MG tablet, Take 1 tablet (100 mg  total) by mouth daily., Disp: 90 tablet, Rfl: 1   Melatonin 5 MG CAPS, Take 5 mg by mouth at bedtime., Disp: , Rfl:    Multiple Vitamin (MULTIVITAMIN WITH MINERALS) TABS tablet, Take 1 tablet by mouth daily., Disp: , Rfl:    Olopatadine HCl 0.2 % SOLN, Apply 1 drop to eye daily., Disp: 2.5 mL, Rfl: 0   Omega-3 Fatty Acids (FISH OIL) 1000 MG CAPS, Take 1 capsule by mouth daily. , Disp: , Rfl:    omeprazole (PRILOSEC) 20 MG capsule, TAKE 1 CAPSULE 2 TIMES A DAY BEFORE A MEAL, Disp: 60 capsule, Rfl: 5   potassium chloride SA (KLOR-CON) 20 MEQ tablet, TAKE  (1)  TABLET TWICE A DAY., Disp: 180 tablet, Rfl: 0   sertraline (ZOLOFT) 50 MG tablet, Take 1 tablet (50 mg total) by mouth daily., Disp: 90 tablet, Rfl: 1   spironolactone (ALDACTONE) 25 MG tablet, Take 1 tablet (25 mg total) by mouth daily., Disp: 90 tablet, Rfl: 0  thioridazine (MELLARIL) 50 MG tablet, Take 1 tablet (50 mg total) by mouth at bedtime., Disp: 90 tablet, Rfl: 1   topiramate (TOPAMAX) 100 MG tablet, Take 1 tablet (100 mg total) by mouth 2 (two) times daily., Disp: 180 tablet, Rfl: 1   zolpidem (AMBIEN) 5 MG tablet, Take 1 tablet (5 mg total) by mouth at bedtime., Disp: 30 tablet, Rfl: 5   No Known Allergies Past Medical History:  Diagnosis Date   Gait disturbance    GERD (gastroesophageal reflux disease)    HA (headache)    Hernia of abdominal wall    Hypertension    Lower extremity edema    Seizures (HCC)    Thyroid disease    Tremor     Past Surgical History:  Procedure Laterality Date   CATARACT EXTRACTION, BILATERAL     STOMACH SURGERY      Social History   Socioeconomic History   Marital status: Single    Spouse name: Not on file   Number of children: 0   Years of education: HS   Highest education level: 12th grade  Occupational History   Occupation: Disabled  Tobacco Use   Smoking status: Never Smoker   Smokeless tobacco: Never Used  Substance and Sexual Activity    Alcohol use: No   Drug use: No   Sexual activity: Not Currently    Birth control/protection: Post-menopausal  Other Topics Concern   Not on file  Social History Narrative   Patient lives at home alone. Patient has a high school education. Her sister comes in frequently to take care of medications, groceries. She now has an Aid that that comes in M-F for 16 hours a week that helps her shower and do oral care.   Caffeine - one soda daily.   Social Determinants of Health   Financial Resource Strain: Low Risk    Difficulty of Paying Living Expenses: Not very hard  Food Insecurity: No Food Insecurity   Worried About Charity fundraiser in the Last Year: Never true   Ran Out of Food in the Last Year: Never true  Transportation Needs: No Transportation Needs   Lack of Transportation (Medical): No   Lack of Transportation (Non-Medical): No  Physical Activity: Insufficiently Active   Days of Exercise per Week: 7 days   Minutes of Exercise per Session: 10 min  Stress: No Stress Concern Present   Feeling of Stress : Not at all  Social Connections: Moderately Isolated   Frequency of Communication with Friends and Family: More than three times a week   Frequency of Social Gatherings with Friends and Family: More than three times a week   Attends Religious Services: Never   Marine scientist or Organizations: No   Attends Music therapist: Never   Marital Status: Never married  Human resources officer Violence: Not At Risk   Fear of Current or Ex-Partner: No   Emotionally Abused: No   Physically Abused: No   Sexually Abused: No        Objective:    BP 125/61    Pulse (!) 58    Temp 97.6 F (36.4 C) (Temporal)    Ht 5' 1"  (1.549 m)    Wt 198 lb 6.4 oz (90 kg)    SpO2 100%    BMI 37.49 kg/m   Wt Readings from Last 3 Encounters:  09/06/19 198 lb 6.4 oz (90 kg)  05/23/19 207 lb 9.6 oz (94.2 kg)  01/25/19 219 lb (  99.3 kg)    Physical Exam Vitals  reviewed.  Constitutional:      General: She is not in acute distress.    Appearance: Normal appearance. She is obese. She is not ill-appearing, toxic-appearing or diaphoretic.  HENT:     Head: Normocephalic and atraumatic.     Right Ear: Tympanic membrane, ear canal and external ear normal. There is no impacted cerumen.     Left Ear: Tympanic membrane, ear canal and external ear normal. There is no impacted cerumen.  Eyes:     General: No scleral icterus.       Right eye: No discharge.        Left eye: No discharge.     Conjunctiva/sclera: Conjunctivae normal.  Cardiovascular:     Rate and Rhythm: Normal rate and regular rhythm.     Heart sounds: Normal heart sounds. No murmur. No friction rub. No gallop.   Pulmonary:     Effort: Pulmonary effort is normal. No respiratory distress.     Breath sounds: Normal breath sounds. No stridor. No wheezing, rhonchi or rales.  Musculoskeletal:        General: Normal range of motion.     Cervical back: Normal range of motion.  Skin:    General: Skin is warm and dry.     Capillary Refill: Capillary refill takes less than 2 seconds.  Neurological:     General: No focal deficit present.     Mental Status: She is alert and oriented to person, place, and time. Mental status is at baseline.     Gait: Gait abnormal (ambulating with rolling walker).  Psychiatric:        Mood and Affect: Mood normal.        Behavior: Behavior normal.        Thought Content: Thought content normal.        Judgment: Judgment normal.     Lab Results  Component Value Date   TSH 0.338 (L) 05/23/2019   Lab Results  Component Value Date   WBC 10.5 05/23/2019   HGB 11.8 05/23/2019   HCT 35.3 05/23/2019   MCV 96 05/23/2019   PLT 204 05/23/2019   Lab Results  Component Value Date   NA 132 (L) 05/23/2019   K 5.0 05/23/2019   CO2 22 05/23/2019   GLUCOSE 96 05/23/2019   BUN 22 05/23/2019   CREATININE 1.35 (H) 05/23/2019   BILITOT 0.3 05/23/2019   ALKPHOS 119  (H) 05/23/2019   AST 16 05/23/2019   ALT 8 05/23/2019   PROT 6.6 05/23/2019   ALBUMIN 3.9 05/23/2019   CALCIUM 9.2 05/23/2019   ANIONGAP 13 09/30/2018   Lab Results  Component Value Date   CHOL 181 11/30/2018   Lab Results  Component Value Date   HDL 37 (L) 11/30/2018   Lab Results  Component Value Date   LDLCALC 113 (H) 11/30/2018   Lab Results  Component Value Date   TRIG 153 (H) 11/30/2018   Lab Results  Component Value Date   CHOLHDL 4.9 (H) 11/30/2018   No results found for: HGBA1C

## 2019-09-07 ENCOUNTER — Ambulatory Visit (INDEPENDENT_AMBULATORY_CARE_PROVIDER_SITE_OTHER): Payer: Medicare Other

## 2019-09-07 ENCOUNTER — Other Ambulatory Visit: Payer: Self-pay | Admitting: Family Medicine

## 2019-09-07 DIAGNOSIS — E669 Obesity, unspecified: Secondary | ICD-10-CM

## 2019-09-07 DIAGNOSIS — N183 Chronic kidney disease, stage 3 unspecified: Secondary | ICD-10-CM

## 2019-09-07 DIAGNOSIS — I129 Hypertensive chronic kidney disease with stage 1 through stage 4 chronic kidney disease, or unspecified chronic kidney disease: Secondary | ICD-10-CM | POA: Diagnosis not present

## 2019-09-07 DIAGNOSIS — E785 Hyperlipidemia, unspecified: Secondary | ICD-10-CM

## 2019-09-07 DIAGNOSIS — E039 Hypothyroidism, unspecified: Secondary | ICD-10-CM

## 2019-09-07 DIAGNOSIS — D631 Anemia in chronic kidney disease: Secondary | ICD-10-CM

## 2019-09-07 DIAGNOSIS — Q048 Other specified congenital malformations of brain: Secondary | ICD-10-CM

## 2019-09-07 DIAGNOSIS — M1990 Unspecified osteoarthritis, unspecified site: Secondary | ICD-10-CM

## 2019-09-07 DIAGNOSIS — K219 Gastro-esophageal reflux disease without esophagitis: Secondary | ICD-10-CM

## 2019-09-07 DIAGNOSIS — I739 Peripheral vascular disease, unspecified: Secondary | ICD-10-CM | POA: Diagnosis not present

## 2019-09-07 DIAGNOSIS — D539 Nutritional anemia, unspecified: Secondary | ICD-10-CM

## 2019-09-07 DIAGNOSIS — Z9181 History of falling: Secondary | ICD-10-CM

## 2019-09-07 DIAGNOSIS — G40909 Epilepsy, unspecified, not intractable, without status epilepticus: Secondary | ICD-10-CM

## 2019-09-07 DIAGNOSIS — F259 Schizoaffective disorder, unspecified: Secondary | ICD-10-CM

## 2019-09-07 LAB — CMP14+EGFR
ALT: 9 IU/L (ref 0–32)
AST: 22 IU/L (ref 0–40)
Albumin/Globulin Ratio: 1.8 (ref 1.2–2.2)
Albumin: 4.3 g/dL (ref 3.8–4.8)
Alkaline Phosphatase: 117 IU/L (ref 48–121)
BUN/Creatinine Ratio: 13 (ref 12–28)
BUN: 14 mg/dL (ref 8–27)
Bilirubin Total: 0.3 mg/dL (ref 0.0–1.2)
CO2: 21 mmol/L (ref 20–29)
Calcium: 9.1 mg/dL (ref 8.7–10.3)
Chloride: 91 mmol/L — ABNORMAL LOW (ref 96–106)
Creatinine, Ser: 1.1 mg/dL — ABNORMAL HIGH (ref 0.57–1.00)
GFR calc Af Amer: 59 mL/min/{1.73_m2} — ABNORMAL LOW (ref >59)
GFR calc non Af Amer: 51 mL/min/{1.73_m2} — ABNORMAL LOW (ref >59)
Globulin, Total: 2.4 g/dL (ref 1.5–4.5)
Glucose: 82 mg/dL (ref 65–99)
Potassium: 4.6 mmol/L (ref 3.5–5.2)
Sodium: 128 mmol/L — ABNORMAL LOW (ref 134–144)
Total Protein: 6.7 g/dL (ref 6.0–8.5)

## 2019-09-07 LAB — LAMOTRIGINE LEVEL: Lamotrigine Lvl: 3.7 ug/mL (ref 2.0–20.0)

## 2019-09-07 LAB — TSH: TSH: 2.76 u[IU]/mL (ref 0.450–4.500)

## 2019-09-07 MED ORDER — LEVOTHYROXINE SODIUM 137 MCG PO TABS: 137.0000 ug | ORAL_TABLET | Freq: Every day | ORAL | 1 refills | Status: DC

## 2019-10-05 ENCOUNTER — Other Ambulatory Visit: Payer: Self-pay | Admitting: Family Medicine

## 2019-10-05 DIAGNOSIS — I10 Essential (primary) hypertension: Secondary | ICD-10-CM

## 2019-10-10 DIAGNOSIS — E039 Hypothyroidism, unspecified: Secondary | ICD-10-CM | POA: Diagnosis not present

## 2019-10-10 DIAGNOSIS — Q048 Other specified congenital malformations of brain: Secondary | ICD-10-CM | POA: Diagnosis not present

## 2019-10-10 DIAGNOSIS — E785 Hyperlipidemia, unspecified: Secondary | ICD-10-CM | POA: Diagnosis not present

## 2019-10-10 DIAGNOSIS — M1990 Unspecified osteoarthritis, unspecified site: Secondary | ICD-10-CM | POA: Diagnosis not present

## 2019-10-10 DIAGNOSIS — K219 Gastro-esophageal reflux disease without esophagitis: Secondary | ICD-10-CM | POA: Diagnosis not present

## 2019-10-10 DIAGNOSIS — D631 Anemia in chronic kidney disease: Secondary | ICD-10-CM | POA: Diagnosis not present

## 2019-10-10 DIAGNOSIS — D539 Nutritional anemia, unspecified: Secondary | ICD-10-CM | POA: Diagnosis not present

## 2019-10-10 DIAGNOSIS — I129 Hypertensive chronic kidney disease with stage 1 through stage 4 chronic kidney disease, or unspecified chronic kidney disease: Secondary | ICD-10-CM | POA: Diagnosis not present

## 2019-10-10 DIAGNOSIS — I739 Peripheral vascular disease, unspecified: Secondary | ICD-10-CM | POA: Diagnosis not present

## 2019-10-10 DIAGNOSIS — N183 Chronic kidney disease, stage 3 unspecified: Secondary | ICD-10-CM | POA: Diagnosis not present

## 2019-11-01 ENCOUNTER — Encounter (HOSPITAL_COMMUNITY): Payer: Self-pay

## 2019-11-01 ENCOUNTER — Other Ambulatory Visit: Payer: Self-pay

## 2019-11-01 ENCOUNTER — Emergency Department (HOSPITAL_COMMUNITY): Payer: Medicare Other

## 2019-11-01 ENCOUNTER — Emergency Department (HOSPITAL_COMMUNITY)
Admission: EM | Admit: 2019-11-01 | Discharge: 2019-11-01 | Disposition: A | Discharge status: Home / Self Care | Payer: Medicare Other | Attending: Emergency Medicine | Admitting: Emergency Medicine

## 2019-11-01 DIAGNOSIS — I129 Hypertensive chronic kidney disease with stage 1 through stage 4 chronic kidney disease, or unspecified chronic kidney disease: Secondary | ICD-10-CM | POA: Insufficient documentation

## 2019-11-01 DIAGNOSIS — R519 Headache, unspecified: Secondary | ICD-10-CM | POA: Insufficient documentation

## 2019-11-01 DIAGNOSIS — R251 Tremor, unspecified: Secondary | ICD-10-CM | POA: Diagnosis not present

## 2019-11-01 DIAGNOSIS — R6889 Other general symptoms and signs: Secondary | ICD-10-CM | POA: Diagnosis not present

## 2019-11-01 DIAGNOSIS — N183 Chronic kidney disease, stage 3 unspecified: Secondary | ICD-10-CM | POA: Insufficient documentation

## 2019-11-01 DIAGNOSIS — Z743 Need for continuous supervision: Secondary | ICD-10-CM | POA: Diagnosis not present

## 2019-11-01 DIAGNOSIS — R569 Unspecified convulsions: Secondary | ICD-10-CM | POA: Diagnosis not present

## 2019-11-01 DIAGNOSIS — G319 Degenerative disease of nervous system, unspecified: Secondary | ICD-10-CM | POA: Diagnosis not present

## 2019-11-01 DIAGNOSIS — Z79899 Other long term (current) drug therapy: Secondary | ICD-10-CM | POA: Insufficient documentation

## 2019-11-01 DIAGNOSIS — G9389 Other specified disorders of brain: Secondary | ICD-10-CM | POA: Diagnosis not present

## 2019-11-01 DIAGNOSIS — G4489 Other headache syndrome: Secondary | ICD-10-CM | POA: Diagnosis not present

## 2019-11-01 DIAGNOSIS — I6523 Occlusion and stenosis of bilateral carotid arteries: Secondary | ICD-10-CM | POA: Diagnosis not present

## 2019-11-01 DIAGNOSIS — E039 Hypothyroidism, unspecified: Secondary | ICD-10-CM | POA: Diagnosis not present

## 2019-11-01 NOTE — Discharge Instructions (Addendum)
Your CT scan of your head does not show any worrisome changes.  Follow-up with your primary provider this week for recheck.  Return to the ER for any worsening symptoms

## 2019-11-01 NOTE — ED Triage Notes (Signed)
Pt is here for a headache. She is from home and lives alone. She states she's had a headache for a "long long" time. She is alert and oriented, but is slightly confused. NAD

## 2019-11-04 NOTE — ED Provider Notes (Signed)
[facility] Provider Note   CSN: [id number] Arrival date & time: 11/01/19  1330     History Chief Complaint  Patient presents with  . Headache    Pam Sanders is a 69 y.o. female.  HPI      Pam Sanders is a 69 y.o. female with past medical history of chronic headache, hypertension, CKD cerebral ventriculomegaly, schizoaffective schizophrenia and intellectual disability who presents to the Emergency Department complaining of headache.  She comes from home via EMS.  She states that she has had a headache for many years and states that she wants to know what causes her to have a headache.  She states that her PCP knows about her headaches but has not addressed them.  She denies any sudden changes in the quality or nature of her headache.  She describes a throbbing sensation all over the top of her head.  She denies fever, neck pain or stiffness, weakness, change in appetite or activity, visual changes, chest pain or shortness of breath.  She states that she can take Tylenol and her headache will temporarily resolve.  Past Medical History:  Diagnosis Date  . Gait disturbance   . GERD (gastroesophageal reflux disease)   . HA (headache)   . Hernia of abdominal wall   . Hypertension   . Lower extremity edema   . Seizures ([place])   . Thyroid disease   . Tremor     Patient Active Problem List   Diagnosis Date Noted  . Lower extremity edema   . Constipation 11/03/2018  . Abdominal pain 11/03/2018  . Rectal bleeding 11/03/2018  . Difficulty sleeping 10/29/2018  . Allergic rhinitis 10/28/2018  . Conductive hearing loss 10/28/2018  . Degeneration of intervertebral disc 10/28/2018  . Intellectual disability 10/28/2018  . Tremor 06/04/2016  . Essential hypertension 04/05/2016  . Cerebral ventriculomegaly 08/14/2015  . CKD (chronic kidney disease) stage 3, GFR 30-59 ml/min 08/14/2015  . Gait instability 08/14/2015  . Macrocytic anemia 08/14/2015  .  Recurrent falls 08/14/2015  . Dyslipidemia 03/29/2015  . Gastroesophageal reflux disease without esophagitis 09/05/2014  . Schizo-affective schizophrenia, chronic condition ([place]) 09/05/2014  . Hypothyroidism 03/06/2014  . Obesity (BMI 30-39.9) 03/06/2014  . Peripheral vascular disease ([place]) 09/22/2013  . Hernia of abdominal wall 09/15/2010  . Epileptic grand mal status ([place]) 08/03/1999    Past Surgical History:  Procedure Laterality Date  . CATARACT EXTRACTION, BILATERAL    . STOMACH SURGERY       OB History   No obstetric history on file.     Family History  Problem Relation Age of Onset  . Stroke Mother   . Stroke Father   . Stroke Other   . Diabetes Other   . Heart Problems Other   . Thyroid cancer Sister   . Hypertension Sister   . Emphysema Brother   . Other Brother        died at birth  . Aneurysm Brother        stomach  . Throat cancer Brother   . Emphysema Brother   . Heart disease Brother   . Stroke Brother   . Other Brother        MVA  . Other Sister        died at birth  . Aneurysm Sister        head    Social History   Tobacco Use  . Smoking status: Never Smoker  . Smokeless tobacco: Never Used  Vaping Use  . Vaping Use: Never used  Substance Use Topics  . Alcohol use: No  . Drug use: No    Home Medications Prior to Admission medications   Medication Sig Start Date End Date Taking? Authorizing Provider  alum & mag hydroxide-simeth (MAALOX/MYLANTA) 200-200-20 MG/5ML suspension Take 30 mLs by mouth every 6 (six) hours as needed for indigestion or heartburn.    [provider]  atorvastatin (LIPITOR) 10 MG tablet Take 1 tablet (10 mg total) by mouth daily. 09/06/19   Loman Brooklyn, FNP  cetirizine (ZYRTEC) 10 MG tablet Take 1 tablet (10 mg total) by mouth daily. For allergy symptoms 05/23/19   Claretta Fraise, MD  Cholecalciferol (VITAMIN D-3) 1000 UNITS CAPS Take 1,000 Units by mouth daily.     [provider]  Clotrimazole  1 % OINT Apply 1 application topically 2 (two) times daily. 01/12/19   Loman Brooklyn, FNP  famotidine (PEPCID) 20 MG tablet Take 1 tablet (20 mg total) by mouth at bedtime. 09/06/19   Loman Brooklyn, FNP  fluticasone (FLONASE) 50 MCG/ACT nasal spray Place 2 sprays into both nostrils at bedtime. 06/27/19   Loman Brooklyn, FNP  furosemide (LASIX) 20 MG tablet Take 1 tablet (20 mg total) by mouth daily. 09/06/19   Loman Brooklyn, FNP  lamoTRIgine (LAMICTAL) 100 MG tablet Take 1 tablet (100 mg total) by mouth 2 (two) times daily. 09/06/19   Loman Brooklyn, FNP  levothyroxine (SYNTHROID) 137 MCG tablet Take 1 tablet (137 mcg total) by mouth daily before breakfast. 09/07/19   Loman Brooklyn, FNP  LINZESS 72 MCG capsule TAKE 1 CAPSULE DAILY BEFORE BREAKFAST 08/30/19   Erenest Rasher, PA-C  loratadine (CLARITIN) 10 MG tablet Take 1 tablet (10 mg total) by mouth daily. 09/06/19   Loman Brooklyn, FNP  losartan (COZAAR) 100 MG tablet Take 1 tablet (100 mg total) by mouth daily. 09/06/19   Loman Brooklyn, FNP  Melatonin 5 MG CAPS Take 5 mg by mouth at bedtime.    [provider]  Multiple Vitamin (MULTIVITAMIN WITH MINERALS) TABS tablet Take 1 tablet by mouth daily.    [provider]  Olopatadine HCl 0.2 % SOLN Apply 1 drop to eye daily. 12/23/18   Loman Brooklyn, FNP  Omega-3 Fatty Acids (FISH OIL) 1000 MG CAPS Take 1 capsule by mouth daily.     [provider]  omeprazole (PRILOSEC) 20 MG capsule TAKE 1 CAPSULE 2 TIMES A DAY BEFORE A MEAL 08/30/19   Aliene Altes S, PA-C  potassium chloride SA (KLOR-CON) 20 MEQ tablet TAKE  (1)  TABLET TWICE A DAY. 10/05/19   Loman Brooklyn, FNP  sertraline (ZOLOFT) 50 MG tablet Take 1 tablet (50 mg total) by mouth daily. 09/06/19   Hendricks Limes F, FNP  spironolactone (ALDACTONE) 25 MG tablet TAKE 1 TABLET ONCE DAILY 10/05/19   Loman Brooklyn, FNP  thioridazine (MELLARIL) 50 MG tablet Take 1 tablet (50 mg total) by mouth at bedtime. 09/06/19    Loman Brooklyn, FNP  topiramate (TOPAMAX) 100 MG tablet Take 1 tablet (100 mg total) by mouth 2 (two) times daily. 09/06/19   Loman Brooklyn, FNP  zolpidem (AMBIEN) 5 MG tablet Take 1 tablet (5 mg total) by mouth at bedtime. 09/06/19   Loman Brooklyn, FNP    Allergies    Patient has no known allergies.  Review of Systems   Review of Systems  Constitutional: Negative for chills,  fatigue and fever.  HENT: Negative for trouble swallowing.   Eyes: Negative for visual disturbance.  Respiratory: Negative for cough and shortness of breath.   Cardiovascular: Negative for chest pain and leg swelling.  Gastrointestinal: Negative for abdominal pain, nausea and vomiting.  Genitourinary: Negative for dysuria and flank pain.  Musculoskeletal: Negative for arthralgias, back pain, myalgias, neck pain and neck stiffness.  Skin: Negative for rash.  Neurological: Positive for headaches. Negative for dizziness, syncope, weakness and numbness.  Hematological: Does not bruise/bleed easily.    Physical Exam Updated Vital Signs BP (!) 140/59 (BP Location: Right Arm)   Pulse 62   Temp 98.2 F (36.8 C) (Oral)   Resp 16   SpO2 98%   Physical Exam Vitals and nursing note reviewed.  Constitutional:      Appearance: She is well-developed. She is not toxic-appearing.  HENT:     Head: Atraumatic.     Mouth/Throat:     Mouth: Mucous membranes are moist.  Eyes:     Extraocular Movements: Extraocular movements intact.     Pupils: Pupils are equal, round, and reactive to light.  Neck:     Meningeal: Kernig's sign absent.  Cardiovascular:     Rate and Rhythm: Normal rate and regular rhythm.  Pulmonary:     Effort: Pulmonary effort is normal.     Breath sounds: Normal breath sounds.  Chest:     Chest wall: No tenderness.  Abdominal:     Palpations: Abdomen is soft.     Tenderness: There is no abdominal tenderness.  Musculoskeletal:        General: No swelling.     Cervical back: Normal range  of motion. No rigidity.  Skin:    General: Skin is warm.     Capillary Refill: Capillary refill takes less than 2 seconds.     Findings: No rash.  Neurological:     Mental Status: She is alert.  Psychiatric:        Mood and Affect: Mood normal. Mood is not anxious or depressed.        Speech: Speech is delayed.        Behavior: Behavior is not agitated. Behavior is cooperative.        Thought Content: Thought content is not paranoid. Thought content does not include homicidal or suicidal ideation.        Cognition and Memory: Cognition is impaired.     ED Results / Procedures / Treatments   Labs (all labs ordered are listed, but only abnormal results are displayed) Labs Reviewed - No data to display  EKG None  Radiology CT Head Wo Contrast  Result Date: 11/01/2019 CLINICAL DATA:  Headache, suspected intracranial hemorrhage, history of seizures, conductive hearing loss, hypertension EXAM: CT HEAD WITHOUT CONTRAST TECHNIQUE: Contiguous axial images were obtained from the base of the skull through the vertex without intravenous contrast. COMPARISON:  04/28/2016 FINDINGS: Brain: Generalized atrophy. Diffuse dilatation of the ventricular system with ex vacuo dilatation of the occipital horn of the LEFT lateral ventricle. No midline shift or mass effect. Old LEFT occipital infarct. No intracranial hemorrhage, mass lesion or evidence of acute infarction. Extensive dural calcifications noted. No acute extra-axial fluid collections. Small LEFT hemisphere versus RIGHT Vascular: No hyperdense vessels. Minimal atherosclerotic calcification RIGHT carotid artery and LEFT vertebral artery at skull base. Skull: Intact with smaller LEFT calvaria versus RIGHT Sinuses/Orbits: Clear Other: N/A IMPRESSION: Old LEFT occipital infarct with ex vacuo dilatation of the occipital horn of the LEFT  lateral ventricle. No acute intracranial abnormalities. Electronically Signed   By: Lavonia Dana M.D.   On: 11/01/2019  18:32     Procedures Procedures (including critical care time)  Medications Ordered in ED Medications - No data to display  ED Course  I have reviewed the triage vital signs and the nursing notes.  Pertinent labs & imaging results that were available during my care of the patient were reviewed by me and considered in my medical decision making (see chart for details).  Clinical Course as of Nov 03 1521  Tue Jul 27, 497  [id number] 69 year old female with chronic headaches here with complaint of a headache.  She is otherwise neuro intact.  Recent Covid vaccination.  CT head showing no acute findings.  Neuro otherwise intact.  Discharge and have follow-up with PCP.   [MB]    Clinical Course User Index [MB] Hayden Rasmussen, MD   MDM Rules/Calculators/A&P                          Patient here requesting evaluation for chronic headaches.  She denies new or changing symptoms.  Headaches resolve after taking Tylenol.  On exam, she is well-appearing.  No focal neuro deficits.  No meningeal signs.  Patient is elderly appearing and states that she lives alone.  No history of trauma.  She does have some intellectual impairment at baseline per medical record.  I have spoken with the patient and she gives verbal permission for me to contact her sister to provide additional history.  I have spoken with the patient's sister, Pam Sanders, she states that her sister has complained of headaches for many years.  She states that she checked on her sister earlier today and that she stated to her that she was going to the ER.  Patient's sister states that she has recently received the Covid vaccine and she believes that Pam Sanders may be wanting reassurance that she is okay.  CT of the head is without acute findings.  Patient is well-appearing.  No concerning symptoms for meningitis or SAH.  Vital signs reviewed.  I feel that she is appropriate for discharge home, patient agrees to this plan.  Patient sister will provide  transportation home.  Return precautions discussed.    Final Clinical Impression(s) / ED Diagnoses Final diagnoses:  Bad headache    Rx / DC Orders ED Discharge Orders    None       Kem Parkinson, PA-C 11/04/19 1537    Hayden Rasmussen, MD 11/05/19 1123

## 2019-12-08 DIAGNOSIS — E785 Hyperlipidemia, unspecified: Secondary | ICD-10-CM | POA: Diagnosis not present

## 2019-12-08 DIAGNOSIS — K219 Gastro-esophageal reflux disease without esophagitis: Secondary | ICD-10-CM | POA: Diagnosis not present

## 2019-12-08 DIAGNOSIS — I739 Peripheral vascular disease, unspecified: Secondary | ICD-10-CM | POA: Diagnosis not present

## 2019-12-08 DIAGNOSIS — Q048 Other specified congenital malformations of brain: Secondary | ICD-10-CM | POA: Diagnosis not present

## 2019-12-08 DIAGNOSIS — E039 Hypothyroidism, unspecified: Secondary | ICD-10-CM | POA: Diagnosis not present

## 2019-12-08 DIAGNOSIS — M1990 Unspecified osteoarthritis, unspecified site: Secondary | ICD-10-CM | POA: Diagnosis not present

## 2019-12-08 DIAGNOSIS — N183 Chronic kidney disease, stage 3 unspecified: Secondary | ICD-10-CM | POA: Diagnosis not present

## 2019-12-08 DIAGNOSIS — D631 Anemia in chronic kidney disease: Secondary | ICD-10-CM | POA: Diagnosis not present

## 2019-12-08 DIAGNOSIS — I129 Hypertensive chronic kidney disease with stage 1 through stage 4 chronic kidney disease, or unspecified chronic kidney disease: Secondary | ICD-10-CM | POA: Diagnosis not present

## 2019-12-08 DIAGNOSIS — D539 Nutritional anemia, unspecified: Secondary | ICD-10-CM | POA: Diagnosis not present

## 2019-12-23 ENCOUNTER — Other Ambulatory Visit: Payer: Self-pay

## 2019-12-23 ENCOUNTER — Ambulatory Visit (INDEPENDENT_AMBULATORY_CARE_PROVIDER_SITE_OTHER): Payer: Medicare Other

## 2019-12-23 DIAGNOSIS — N183 Chronic kidney disease, stage 3 unspecified: Secondary | ICD-10-CM

## 2019-12-23 DIAGNOSIS — D631 Anemia in chronic kidney disease: Secondary | ICD-10-CM

## 2019-12-23 DIAGNOSIS — I739 Peripheral vascular disease, unspecified: Secondary | ICD-10-CM | POA: Diagnosis not present

## 2019-12-23 DIAGNOSIS — E669 Obesity, unspecified: Secondary | ICD-10-CM

## 2019-12-23 DIAGNOSIS — Z79899 Other long term (current) drug therapy: Secondary | ICD-10-CM

## 2019-12-23 DIAGNOSIS — I129 Hypertensive chronic kidney disease with stage 1 through stage 4 chronic kidney disease, or unspecified chronic kidney disease: Secondary | ICD-10-CM | POA: Diagnosis not present

## 2019-12-23 DIAGNOSIS — D539 Nutritional anemia, unspecified: Secondary | ICD-10-CM

## 2019-12-23 DIAGNOSIS — E785 Hyperlipidemia, unspecified: Secondary | ICD-10-CM

## 2019-12-23 DIAGNOSIS — M1990 Unspecified osteoarthritis, unspecified site: Secondary | ICD-10-CM

## 2019-12-23 DIAGNOSIS — F259 Schizoaffective disorder, unspecified: Secondary | ICD-10-CM

## 2019-12-23 DIAGNOSIS — E039 Hypothyroidism, unspecified: Secondary | ICD-10-CM

## 2019-12-23 DIAGNOSIS — Z9181 History of falling: Secondary | ICD-10-CM

## 2019-12-23 DIAGNOSIS — Q048 Other specified congenital malformations of brain: Secondary | ICD-10-CM

## 2019-12-23 DIAGNOSIS — G40909 Epilepsy, unspecified, not intractable, without status epilepticus: Secondary | ICD-10-CM

## 2019-12-23 DIAGNOSIS — K219 Gastro-esophageal reflux disease without esophagitis: Secondary | ICD-10-CM

## 2019-12-30 ENCOUNTER — Other Ambulatory Visit: Payer: Self-pay | Admitting: Family Medicine

## 2019-12-30 DIAGNOSIS — J309 Allergic rhinitis, unspecified: Secondary | ICD-10-CM

## 2019-12-30 DIAGNOSIS — I10 Essential (primary) hypertension: Secondary | ICD-10-CM

## 2020-01-28 ENCOUNTER — Other Ambulatory Visit: Payer: Self-pay | Admitting: Family Medicine

## 2020-01-28 DIAGNOSIS — J309 Allergic rhinitis, unspecified: Secondary | ICD-10-CM

## 2020-02-07 DIAGNOSIS — K219 Gastro-esophageal reflux disease without esophagitis: Secondary | ICD-10-CM | POA: Diagnosis not present

## 2020-02-07 DIAGNOSIS — Q048 Other specified congenital malformations of brain: Secondary | ICD-10-CM | POA: Diagnosis not present

## 2020-02-07 DIAGNOSIS — E785 Hyperlipidemia, unspecified: Secondary | ICD-10-CM | POA: Diagnosis not present

## 2020-02-07 DIAGNOSIS — I739 Peripheral vascular disease, unspecified: Secondary | ICD-10-CM | POA: Diagnosis not present

## 2020-02-07 DIAGNOSIS — I129 Hypertensive chronic kidney disease with stage 1 through stage 4 chronic kidney disease, or unspecified chronic kidney disease: Secondary | ICD-10-CM | POA: Diagnosis not present

## 2020-02-07 DIAGNOSIS — M1990 Unspecified osteoarthritis, unspecified site: Secondary | ICD-10-CM | POA: Diagnosis not present

## 2020-02-07 DIAGNOSIS — D539 Nutritional anemia, unspecified: Secondary | ICD-10-CM | POA: Diagnosis not present

## 2020-02-07 DIAGNOSIS — D631 Anemia in chronic kidney disease: Secondary | ICD-10-CM | POA: Diagnosis not present

## 2020-02-07 DIAGNOSIS — N183 Chronic kidney disease, stage 3 unspecified: Secondary | ICD-10-CM | POA: Diagnosis not present

## 2020-02-07 DIAGNOSIS — E039 Hypothyroidism, unspecified: Secondary | ICD-10-CM | POA: Diagnosis not present

## 2020-02-14 ENCOUNTER — Other Ambulatory Visit: Payer: Self-pay

## 2020-02-14 ENCOUNTER — Ambulatory Visit (INDEPENDENT_AMBULATORY_CARE_PROVIDER_SITE_OTHER): Payer: Medicare Other | Admitting: *Deleted

## 2020-02-14 DIAGNOSIS — Z23 Encounter for immunization: Secondary | ICD-10-CM

## 2020-02-24 ENCOUNTER — Ambulatory Visit: Payer: Medicare Other

## 2020-02-27 ENCOUNTER — Other Ambulatory Visit: Payer: Self-pay | Admitting: Family Medicine

## 2020-02-27 ENCOUNTER — Other Ambulatory Visit: Payer: Self-pay | Admitting: Nurse Practitioner

## 2020-02-27 DIAGNOSIS — K5900 Constipation, unspecified: Secondary | ICD-10-CM

## 2020-02-27 DIAGNOSIS — E039 Hypothyroidism, unspecified: Secondary | ICD-10-CM

## 2020-02-27 DIAGNOSIS — G40401 Other generalized epilepsy and epileptic syndromes, not intractable, with status epilepticus: Secondary | ICD-10-CM

## 2020-02-27 DIAGNOSIS — K219 Gastro-esophageal reflux disease without esophagitis: Secondary | ICD-10-CM

## 2020-02-29 ENCOUNTER — Ambulatory Visit (INDEPENDENT_AMBULATORY_CARE_PROVIDER_SITE_OTHER): Payer: Medicare Other | Admitting: *Deleted

## 2020-02-29 VITALS — Ht 61.0 in | Wt 198.4 lb

## 2020-02-29 DIAGNOSIS — Z0000 Encounter for general adult medical examination without abnormal findings: Secondary | ICD-10-CM | POA: Diagnosis not present

## 2020-02-29 NOTE — Patient Instructions (Addendum)
  [facility] Maintenance Summary and Written Plan of Care  Pam Sanders ,  Thank you for allowing me to perform your Medicare Annual Wellness Visit and for your ongoing commitment to your health.   Health Maintenance & Immunization History Health Maintenance  Topic Date Due  . COVID-19 Vaccine (1) Never done  . Fecal DNA (Cologuard)  Never done  . PNA vac Low Risk Adult (2 of 2 - PPSV23) 10/05/2019  . MAMMOGRAM  03/10/2021  . TETANUS/TDAP  10/04/2024  . INFLUENZA VACCINE  Completed  . DEXA SCAN  Completed  . Hepatitis C Screening  Completed   Immunization History  Administered Date(s) Administered  . Fluad Quad(high Dose 65+) 01/12/2019, 02/14/2020  . Influenza, High Dose Seasonal PF 01/28/2016, 05/01/2016, 01/23/2017, 01/21/2018  . Influenza-Unspecified 02/22/2000, 02/09/2002, 01/26/2003, 02/13/2005, 01/26/2006, 03/09/2014, 01/11/2015, 01/28/2016, 05/01/2016, 01/23/2017, 01/21/2018  . Pneumococcal Conjugate-13 05/01/2016  . Pneumococcal Polysaccharide-23 10/05/2014  . Tdap 10/05/2014    These are the patient goals that we discussed: Goals Addressed            This Visit's Progress   . Exercise 3x per week (30 min per time)       02/29/2020 AWV Goal: Exercise for General Health   Patient will verbalize understanding of the benefits of increased physical activity:  Exercising regularly is important. It will improve your overall fitness, flexibility, and endurance.  Regular exercise also will improve your overall health. It can help you control your weight, reduce stress, and improve your bone density.  Over the next year, patient will increase physical activity as tolerated with a goal of at least 150 minutes of moderate physical activity per week.   You can tell that you are exercising at a moderate intensity if your heart starts beating faster and you start breathing faster but can still hold a conversation.  Moderate-intensity exercise  ideas include:  Walking 1 mile (1.6 km) in about 15 minutes  Biking  Hiking  Golfing  Dancing  Water aerobics  Patient will verbalize understanding of everyday activities that increase physical activity by providing examples like the following: ? Yard work, such as: ? Pushing a Conservation officer, nature ? Raking and bagging leaves ? Washing your car ? Pushing a stroller ? Shoveling snow ? Gardening ? Washing windows or floors  Patient will be able to explain general safety guidelines for exercising:   Before you start a new exercise program, talk with your health care provider.  Do not exercise so much that you hurt yourself, feel dizzy, or get very short of breath.  Wear comfortable clothes and wear shoes with good support.  Drink plenty of water while you exercise to prevent dehydration or heat stroke.  Work out until your breathing and your heartbeat get faster.         This is a list of Health Maintenance Items that are overdue or due now: Health Maintenance Due  Topic Date Due  . COVID-19 Vaccine (1) Never done  . Fecal DNA (Cologuard)  Never done  . PNA vac Low Risk Adult (2 of 2 - PPSV23) 10/05/2019     Orders/Referrals Placed Today: No orders of the defined types were placed in this encounter.  (Contact our referral department at [phone] if you have not spoken with someone about your referral appointment within the next 5 days)    Follow-up Plan  Follow up with PCP as scheduled

## 2020-02-29 NOTE — Progress Notes (Signed)
MEDICARE ANNUAL WELLNESS VISIT  02/29/2020  Telephone Visit Disclaimer This Medicare AWV was conducted by telephone due to national recommendations for restrictions regarding the COVID-19 Pandemic (e.g. social distancing).  I verified, using two identifiers, that I am speaking with Pam Sanders or their authorized healthcare agent. I discussed the limitations, risks, security, and privacy concerns of performing an evaluation and management service by telephone and the potential availability of an in-person appointment in the future. The patient expressed understanding and agreed to proceed.  Location of Patient: Home Location of Provider (nurse):  Office  Subjective:    Pam Sanders is a 69 y.o. female patient of Loman Brooklyn, FNP who had a Medicare Annual Wellness Visit today via telephone. Pam Sanders is Disabled and lives alone. she has 0 children. she reports that she is not socially active and does interact with friends/family regularly. she is not physically active and enjoys sewing.  Patient Care Team: Loman Brooklyn, FNP as PCP - General (Family Medicine) Danie Binder, MD (Inactive) as Consulting Physician (Gastroenterology) Marcial Pacas, MD as Consulting Physician (Neurology)  Advanced Directives 02/29/2020 02/23/2019 09/30/2018 04/28/2016 04/05/2016 04/05/2016 04/05/2016  Does Patient Have a Medical Advance Directive? Yes No No No Yes Yes Yes  Type of Advance Directive Healthcare Power of [place]  Does patient want to make changes to medical advance directive? No - Patient declined - - - No - Patient declined No - Patient declined -  Copy of [place] in Chart? No - copy requested - - - No - copy requested No - copy requested -  Would patient like information on creating a medical advance directive? - No - Patient declined No - Patient declined - - - [facility] Utilization Over the Past 12 Months: # of hospitalizations or ER visits: 1 # of surgeries: 0  Review of Systems    Patient reports that her overall health is unchanged compared to last year.  History obtained from chart review and the patient General ROS: negative  Patient Reported Readings (BP, Pulse, CBG, Weight, etc) none  Pain Assessment Pain : No/denies pain     Current Medications & Allergies (verified) Allergies as of 02/29/2020   No Known Allergies     Medication List       Accurate as of February 29, 2020 11:42 AM. If you have any questions, ask your nurse or doctor.        alum & mag hydroxide-simeth 200-200-20 MG/5ML suspension Commonly known as: MAALOX/MYLANTA Take 30 mLs by mouth every 6 (six) hours as needed for indigestion or heartburn.   atorvastatin 10 MG tablet Commonly known as: LIPITOR Take 1 tablet (10 mg total) by mouth daily.   cetirizine 10 MG tablet Commonly known as: ZYRTEC Take 1 tablet (10 mg total) by mouth daily. For allergy symptoms   Clotrimazole 1 % Oint Apply 1 application topically 2 (two) times daily.   famotidine 20 MG tablet Commonly known as: PEPCID Take 1 tablet (20 mg total) by mouth at bedtime.   Fish Oil 1000 MG Caps Take 1 capsule by mouth daily.   fluticasone 50 MCG/ACT nasal spray Commonly known as: FLONASE Place 2 sprays into both nostrils at bedtime.   furosemide 20 MG tablet Commonly known as: LASIX Take 1 tablet (20 mg total) by mouth daily.   lamoTRIgine 100 MG tablet Commonly  known as: LAMICTAL Take 1 tablet (100 mg total) by mouth 2 (two) times daily.   levothyroxine 137 MCG tablet Commonly known as: SYNTHROID TAKE 1 TABLET DAILY BEFORE BREAKFAST   Linzess 72 MCG capsule Generic drug: linaclotide TAKE 1 CAPSULE DAILY BEFORE BREAKFAST   loratadine 10 MG tablet Commonly known as: CLARITIN Take 1 tablet (10 mg total) by mouth daily.   losartan 100 MG tablet Commonly known as:  COZAAR Take 1 tablet (100 mg total) by mouth daily.   Melatonin 5 MG Caps Take 5 mg by mouth at bedtime.   multivitamin with minerals Tabs tablet Take 1 tablet by mouth daily.   Olopatadine HCl 0.2 % Soln Apply 1 drop to eye daily.   omeprazole 20 MG capsule Commonly known as: PRILOSEC TAKE 1 CAPSULE 2 TIMES A DAY BEFORE A MEAL   potassium chloride SA 20 MEQ tablet Commonly known as: KLOR-CON TAKE  (1)  TABLET TWICE A DAY.   sertraline 50 MG tablet Commonly known as: ZOLOFT Take 1 tablet (50 mg total) by mouth daily.   spironolactone 25 MG tablet Commonly known as: ALDACTONE TAKE 1 TABLET ONCE DAILY   thioridazine 50 MG tablet Commonly known as: MELLARIL Take 1 tablet (50 mg total) by mouth at bedtime.   topiramate 100 MG tablet Commonly known as: TOPAMAX TAKE  (1)  TABLET TWICE A DAY.   Vitamin D-3 25 MCG (1000 UT) Caps Take 1,000 Units by mouth daily.   zolpidem 5 MG tablet Commonly known as: AMBIEN Take 1 tablet (5 mg total) by mouth at bedtime.       History (reviewed): Past Medical History:  Diagnosis Date  . Gait disturbance   . GERD (gastroesophageal reflux disease)   . HA (headache)   . Hernia of abdominal wall   . Hypertension   . Lower extremity edema   . Seizures ([place])   . Thyroid disease   . Tremor    Past Surgical History:  Procedure Laterality Date  . CATARACT EXTRACTION, BILATERAL    . STOMACH SURGERY     Family History  Problem Relation Age of Onset  . Stroke Mother   . Stroke Father   . Stroke Other   . Diabetes Other   . Heart Problems Other   . Thyroid cancer Sister   . Hypertension Sister   . Emphysema Brother   . Other Brother        died at birth  . Aneurysm Brother        stomach  . Throat cancer Brother   . Emphysema Brother   . Heart disease Brother   . Stroke Brother   . Other Brother        MVA  . Other Sister        died at birth  . Aneurysm Sister        head   Social History   Socioeconomic  History  . Marital status: Single    Spouse name: Not on file  . Number of children: 0  . Years of education: HS  . Highest education level: 12th grade  Occupational History  . Occupation: Disabled  Tobacco Use  . Smoking status: Never Smoker  . Smokeless tobacco: Never Used  Vaping Use  . Vaping Use: Never used  Substance and Sexual Activity  . Alcohol use: No  . Drug use: No  . Sexual activity: Not Currently    Birth control/protection: Post-menopausal  Other Topics Concern  . Not on  file  Social History Narrative   Patient lives at home alone. Patient has a high school education. Her sister comes in frequently to take care of medications, groceries. She now has an Aid that that comes in M-F for 16 hours a week that helps her shower and do oral care.   Caffeine - one soda daily.   Social Determinants of Health   Financial Resource Strain: Low Risk   . Difficulty of Paying Living Expenses: Not hard at all  Food Insecurity: No Food Insecurity  . Worried About Charity fundraiser in the Last Year: Never true  . Ran Out of Food in the Last Year: Never true  Transportation Needs: Unknown  . Lack of Transportation (Medical): Not on file  . Lack of Transportation (Non-Medical): No  Physical Activity: Inactive  . Days of Exercise per Week: 0 days  . Minutes of Exercise per Session: 0 min  Stress: No Stress Concern Present  . Feeling of Stress : Not at all  Social Connections: Socially Isolated  . Frequency of Communication with Friends and Family: More than three times a week  . Frequency of Social Gatherings with Friends and Family: More than three times a week  . Attends Religious Services: Never  . Active Member of Clubs or Organizations: No  . Attends Archivist Meetings: Never  . Marital Status: Never married    Activities of Daily Living In your present state of health, do you have any difficulty performing the following activities: 02/29/2020  Hearing? Y    Vision? N  Difficulty concentrating or making decisions? Y  Walking or climbing stairs? Y  Dressing or bathing? Y  Doing errands, shopping? Y  Preparing Food and eating ? Y  Using the Toilet? N  In the past six months, have you accidently leaked urine? Y  Do you have problems with loss of bowel control? N  Managing your Medications? N  Managing your Finances? N  Housekeeping or managing your Housekeeping? Y  Some recent data might be hidden    Patient Education/ Literacy How often do you need to have someone help you when you read instructions, pamphlets, or other written materials from your doctor or pharmacy?: 3 - Sometimes What is the last grade level you completed in school?: 12th Grade  Exercise Current Exercise Habits: The patient does not participate in regular exercise at present, Exercise limited by: orthopedic condition(s)  Diet Patient reports consuming 3 meals a day and 2 snack(s) a day Patient reports that her primary diet is: Regular Patient reports that she does have regular access to food.   Depression Screen PHQ 2/9 Scores 02/29/2020 09/06/2019 09/06/2019 05/23/2019 02/23/2019 01/12/2019  PHQ - 2 Score 0 0 0 0 0 0  PHQ- 9 Score - 1 - - - -     Fall Risk Fall Risk  02/29/2020 09/06/2019 05/23/2019 02/23/2019 01/12/2019  Falls in the past year? 1 1 0 0 0  Number falls in past yr: 0 0 0 - -  Injury with Fall? 1 0 0 - -  Risk for fall due to : History of fall(s);Impaired balance/gait - Impaired balance/gait;Impaired mobility - -  Follow up Falls evaluation completed Falls prevention discussed Falls evaluation completed - -     Objective:  Pam Sanders seemed alert and oriented and she participated appropriately during our telephone visit.  Blood Pressure Weight BMI  BP Readings from Last 3 Encounters:  11/01/19 (!) 140/59  09/06/19 125/61  05/23/19  129/67   Wt Readings from Last 3 Encounters:  02/29/20 198 lb 6.6 oz (90 kg)  09/06/19 198 lb 6.4 oz (90 kg)   05/23/19 207 lb 9.6 oz (94.2 kg)   BMI Readings from Last 1 Encounters:  02/29/20 37.49 kg/m    *Unable to obtain current vital signs, weight, and BMI due to telephone visit type  Hearing/Vision  . Pam Sanders did  seem to have difficulty with hearing/understanding during the telephone conversation . Reports that she has had a formal eye exam by an eye care professional within the past year . Reports that she has not had a formal hearing evaluation within the past year *Unable to fully assess hearing and vision during telephone visit type  Cognitive Function: 6CIT Screen 02/29/2020 02/23/2019  What Year? 0 points 0 points  What month? 0 points 0 points  What time? 0 points 0 points  Count back from 20 4 points 4 points  Months in reverse 4 points 4 points  Repeat phrase 10 points 10 points  Total Score 18 18   (Normal:0-7, Significant for Dysfunction: >8)  Normal Cognitive Function Screening: No:    Immunization & Health Maintenance Record Immunization History  Administered Date(s) Administered  . Fluad Quad(high Dose 65+) 01/12/2019, 02/14/2020  . Influenza, High Dose Seasonal PF 01/28/2016, 05/01/2016, 01/23/2017, 01/21/2018  . Influenza-Unspecified 02/22/2000, 02/09/2002, 01/26/2003, 02/13/2005, 01/26/2006, 03/09/2014, 01/11/2015, 01/28/2016, 05/01/2016, 01/23/2017, 01/21/2018  . Pneumococcal Conjugate-13 05/01/2016  . Pneumococcal Polysaccharide-23 10/05/2014  . Tdap 10/05/2014    Health Maintenance  Topic Date Due  . COVID-19 Vaccine (1) Never done  . Fecal DNA (Cologuard)  Never done  . PNA vac Low Risk Adult (2 of 2 - PPSV23) 10/05/2019  . MAMMOGRAM  03/10/2021  . TETANUS/TDAP  10/04/2024  . INFLUENZA VACCINE  Completed  . DEXA SCAN  Completed  . Hepatitis C Screening  Completed       Assessment  This is a routine wellness examination for Pam Sanders.  Health Maintenance: Due or Overdue Health Maintenance Due  Topic Date Due  . COVID-19 Vaccine (1)  Never done  . Fecal DNA (Cologuard)  Never done  . PNA vac Low Risk Adult (2 of 2 - PPSV23) 10/05/2019    Pam Sanders does not need a referral for Community Assistance: Care Management:   no Social Work:    no Prescription Assistance:  no Nutrition/Diabetes Education:  no   Plan:  Personalized Goals Goals Addressed            This Visit's Progress   . Exercise 3x per week (30 min per time)       02/29/2020 AWV Goal: Exercise for General Health   Patient will verbalize understanding of the benefits of increased physical activity:  Exercising regularly is important. It will improve your overall fitness, flexibility, and endurance.  Regular exercise also will improve your overall health. It can help you control your weight, reduce stress, and improve your bone density.  Over the next year, patient will increase physical activity as tolerated with a goal of at least 150 minutes of moderate physical activity per week.   You can tell that you are exercising at a moderate intensity if your heart starts beating faster and you start breathing faster but can still hold a conversation.  Moderate-intensity exercise ideas include:  Walking 1 mile (1.6 km) in about 15 minutes  Biking  Hiking  Golfing  Dancing  Water aerobics  Patient will verbalize understanding of everyday  activities that increase physical activity by providing examples like the following: ? Yard work, such as: ? Pushing a Conservation officer, nature ? Raking and bagging leaves ? Washing your car ? Pushing a stroller ? Shoveling snow ? Gardening ? Washing windows or floors  Patient will be able to explain general safety guidelines for exercising:   Before you start a new exercise program, talk with your health care provider.  Do not exercise so much that you hurt yourself, feel dizzy, or get very short of breath.  Wear comfortable clothes and wear shoes with good support.  Drink plenty of water while you  exercise to prevent dehydration or heat stroke.  Work out until your breathing and your heartbeat get faster.       Personalized Health Maintenance & Screening Recommendations  Pneumococcal vaccine  Colorectal cancer screening  Lung Cancer Screening Recommended: no (Low Dose CT Chest recommended if Age 23-80 years, 30 pack-year currently smoking OR have quit w/in past 15 years) Hepatitis C Screening recommended: no HIV Screening recommended: no  Advanced Directives: Written information was not prepared per patient's request.  Referrals & Orders No orders of the defined types were placed in this encounter.   Follow-up Plan . Follow-up with Loman Brooklyn, FNP as planned    I have personally reviewed and noted the following in the patient's chart:   . Medical and social history . Use of alcohol, tobacco or illicit drugs  . Current medications and supplements . Functional ability and status . Nutritional status . Physical activity . Advanced directives . List of other physicians . Hospitalizations, surgeries, and ER visits in previous 12 months . Vitals . Screenings to include cognitive, depression, and falls . Referrals and appointments  In addition, I have reviewed and discussed with Pam Sanders certain preventive protocols, quality metrics, and best practice recommendations. A written personalized care plan for preventive services as well as general preventive health recommendations is available and can be mailed to the patient at her request.      Wardell Heath, LPN  [id number]     AVS printed and mailed to patient

## 2020-03-13 ENCOUNTER — Encounter: Payer: Medicaid Other | Admitting: Family Medicine

## 2020-03-22 ENCOUNTER — Encounter: Payer: Self-pay | Admitting: Family Medicine

## 2020-03-22 ENCOUNTER — Ambulatory Visit (INDEPENDENT_AMBULATORY_CARE_PROVIDER_SITE_OTHER): Payer: Medicare Other | Admitting: Family Medicine

## 2020-03-22 ENCOUNTER — Other Ambulatory Visit: Payer: Self-pay

## 2020-03-22 VITALS — BP 93/50 | HR 60 | Temp 97.2°F | Ht 61.0 in | Wt 183.0 lb

## 2020-03-22 DIAGNOSIS — F259 Schizoaffective disorder, unspecified: Secondary | ICD-10-CM

## 2020-03-22 DIAGNOSIS — G8929 Other chronic pain: Secondary | ICD-10-CM

## 2020-03-22 DIAGNOSIS — G40401 Other generalized epilepsy and epileptic syndromes, not intractable, with status epilepticus: Secondary | ICD-10-CM

## 2020-03-22 DIAGNOSIS — F79 Unspecified intellectual disabilities: Secondary | ICD-10-CM

## 2020-03-22 DIAGNOSIS — E039 Hypothyroidism, unspecified: Secondary | ICD-10-CM | POA: Diagnosis not present

## 2020-03-22 DIAGNOSIS — Z23 Encounter for immunization: Secondary | ICD-10-CM

## 2020-03-22 DIAGNOSIS — K439 Ventral hernia without obstruction or gangrene: Secondary | ICD-10-CM

## 2020-03-22 DIAGNOSIS — M545 Low back pain, unspecified: Secondary | ICD-10-CM | POA: Diagnosis not present

## 2020-03-22 DIAGNOSIS — I10 Essential (primary) hypertension: Secondary | ICD-10-CM | POA: Diagnosis not present

## 2020-03-22 DIAGNOSIS — R2681 Unsteadiness on feet: Secondary | ICD-10-CM

## 2020-03-22 DIAGNOSIS — Z0001 Encounter for general adult medical examination with abnormal findings: Secondary | ICD-10-CM | POA: Diagnosis not present

## 2020-03-22 DIAGNOSIS — E669 Obesity, unspecified: Secondary | ICD-10-CM

## 2020-03-22 DIAGNOSIS — E785 Hyperlipidemia, unspecified: Secondary | ICD-10-CM

## 2020-03-22 DIAGNOSIS — I739 Peripheral vascular disease, unspecified: Secondary | ICD-10-CM

## 2020-03-22 DIAGNOSIS — K219 Gastro-esophageal reflux disease without esophagitis: Secondary | ICD-10-CM

## 2020-03-22 DIAGNOSIS — R569 Unspecified convulsions: Secondary | ICD-10-CM

## 2020-03-22 MED ORDER — METHYLPREDNISOLONE ACETATE 80 MG/ML IJ SUSP
80.0000 mg | Freq: Once | INTRAMUSCULAR | Status: AC
  Administered 2020-03-22: 09:00:00 80 mg via INTRAMUSCULAR

## 2020-03-22 NOTE — Patient Instructions (Addendum)
Health Maintenance, Female Adopting a healthy lifestyle and getting preventive care are important in promoting health and wellness. Ask your health care provider about:  The right schedule for you to have regular tests and exams.  Things you can do on your own to prevent diseases and keep yourself healthy. What should I know about diet, weight, and exercise? Eat a healthy diet   Eat a diet that includes plenty of vegetables, fruits, low-fat dairy products, and lean protein.  Do not eat a lot of foods that are high in solid fats, added sugars, or sodium. Maintain a healthy weight Body mass index (BMI) is used to identify weight problems. It estimates body fat based on height and weight. Your health care provider can help determine your BMI and help you achieve or maintain a healthy weight. Get regular exercise Get regular exercise. This is one of the most important things you can do for your health. Most adults should:  Exercise for at least 150 minutes each week. The exercise should increase your heart rate and make you sweat (moderate-intensity exercise).  Do strengthening exercises at least twice a week. This is in addition to the moderate-intensity exercise.  Spend less time sitting. Even light physical activity can be beneficial. Watch cholesterol and blood lipids Have your blood tested for lipids and cholesterol at 69 years of age, then have this test every 5 years. Have your cholesterol levels checked more often if:  Your lipid or cholesterol levels are high.  You are older than 69 years of age.  You are at high risk for heart disease. What should I know about cancer screening? Depending on your health history and family history, you may need to have cancer screening at various ages. This may include screening for:  Breast cancer.  Cervical cancer.  Colorectal cancer.  Skin cancer.  Lung cancer. What should I know about heart disease, diabetes, and high blood  pressure? Blood pressure and heart disease  High blood pressure causes heart disease and increases the risk of stroke. This is more likely to develop in people who have high blood pressure readings, are of African descent, or are overweight.  Have your blood pressure checked: ? Every 3-5 years if you are 54-9 years of age. ? Every year if you are 69 years old or older. Diabetes Have regular diabetes screenings. This checks your fasting blood sugar level. Have the screening done:  Once every three years after age 36 if you are at a normal weight and have a low risk for diabetes.  More often and at a younger age if you are overweight or have a high risk for diabetes. What should I know about preventing infection? Hepatitis B If you have a higher risk for hepatitis B, you should be screened for this virus. Talk with your health care provider to find out if you are at risk for hepatitis B infection. Hepatitis C Testing is recommended for:  Everyone born from [birth year] through 1965.  Anyone with known risk factors for hepatitis C. Sexually transmitted infections (STIs)  Get screened for STIs, including gonorrhea and chlamydia, if: ? You are sexually active and are younger than 69 years of age. ? You are older than 69 years of age and your health care provider tells you that you are at risk for this type of infection. ? Your sexual activity has changed since you were last screened, and you are at increased risk for chlamydia or gonorrhea. Ask your health care provider  if you are at risk.  Ask your health care provider about whether you are at high risk for HIV. Your health care provider may recommend a prescription medicine to help prevent HIV infection. If you choose to take medicine to prevent HIV, you should first get tested for HIV. You should then be tested every 3 months for as long as you are taking the medicine. Pregnancy  If you are about to stop having your period (premenopausal) and  you may become pregnant, seek counseling before you get pregnant.  Take 400 to 800 micrograms (mcg) of folic acid every day if you become pregnant.  Ask for birth control (contraception) if you want to prevent pregnancy. Osteoporosis and menopause Osteoporosis is a disease in which the bones lose minerals and strength with aging. This can result in bone fractures. If you are 65 years old or older, or if you are at risk for osteoporosis and fractures, ask your health care provider if you should:  Be screened for bone loss.  Take a calcium or vitamin D supplement to lower your risk of fractures.  Be given hormone replacement therapy (HRT) to treat symptoms of menopause. Follow these instructions at home: Lifestyle  Do not use any products that contain nicotine or tobacco, such as cigarettes, e-cigarettes, and chewing tobacco. If you need help quitting, ask your health care provider.  Do not use street drugs.  Do not share needles.  Ask your health care provider for help if you need support or information about quitting drugs. Alcohol use  Do not drink alcohol if: ? Your health care provider tells you not to drink. ? You are pregnant, may be pregnant, or are planning to become pregnant.  If you drink alcohol: ? Limit how much you use to 0-1 drink a day. ? Limit intake if you are breastfeeding.  Be aware of how much alcohol is in your drink. In the [place] one drink equals one 12 oz bottle of beer (355 mL), one 5 oz glass of wine (148 mL), or one 1 oz glass of hard liquor (44 mL). General instructions  Schedule regular health, dental, and eye exams.  Stay current with your vaccines.  Tell your health care provider if: ? You often feel depressed. ? You have ever been abused or do not feel safe at home. Summary  Adopting a healthy lifestyle and getting preventive care are important in promoting health and wellness.  Follow your health care provider's instructions about healthy  diet, exercising, and getting tested or screened for diseases.  Follow your health care provider's instructions on monitoring your cholesterol and blood pressure. This information is not intended to replace advice given to you by your health care provider. Make sure you discuss any questions you have with your health care provider. Document Revised: 03/17/2018 Document Reviewed: 03/17/2018 Elsevier Patient Education  2020 [employer]     Why follow it? Research shows. . Those who follow the Mediterranean diet have a reduced risk of heart disease  . The diet is associated with a reduced incidence of Parkinson's and Alzheimer's diseases . People following the diet may have longer life expectancies and lower rates of chronic diseases  . The Dietary Guidelines for Americans recommends the Mediterranean diet as an eating plan to promote health and prevent disease  What Is the Mediterranean Diet?  . Healthy eating plan based on typical foods and recipes of Mediterranean-style cooking . The diet is primarily a plant based diet; these foods should make   up a majority of meals   Starches - Plant based foods should make up a majority of meals - They are an important sources of vitamins, minerals, energy, antioxidants, and fiber - Choose whole grains, foods high in fiber and minimally processed items  - Typical grain sources include wheat, oats, barley, corn, brown rice, bulgar, farro, millet, polenta, couscous  - Various types of beans include chickpeas, lentils, fava beans, black beans, white beans   Fruits  Veggies - Large quantities of antioxidant rich fruits & veggies; 6 or more servings  - Vegetables can be eaten raw or lightly drizzled with oil and cooked  - Vegetables common to the traditional Mediterranean Diet include: artichokes, arugula, beets, broccoli, brussel sprouts, cabbage, carrots, celery, collard greens, cucumbers, eggplant, kale, leeks, lemons, lettuce, mushrooms, okra, onions,  peas, peppers, potatoes, pumpkin, radishes, rutabaga, shallots, spinach, sweet potatoes, turnips, zucchini - Fruits common to the Mediterranean Diet include: apples, apricots, avocados, cherries, clementines, dates, figs, grapefruits, grapes, melons, nectarines, oranges, peaches, pears, pomegranates, strawberries, tangerines  Fats - Replace butter and margarine with healthy oils, such as olive oil, canola oil, and tahini  - Limit nuts to no more than a handful a day  - Nuts include walnuts, almonds, pecans, pistachios, pine nuts  - Limit or avoid candied, honey roasted or heavily salted nuts - Olives are central to the Mediterranean diet - can be eaten whole or used in a variety of dishes   Meats Protein - Limiting red meat: no more than a few times a month - When eating red meat: choose lean cuts and keep the portion to the size of deck of cards - Eggs: approx. 0 to 4 times a week  - Fish and lean poultry: at least 2 a week  - Healthy protein sources include, chicken, turkey, lean beef, lamb - Increase intake of seafood such as tuna, salmon, trout, mackerel, shrimp, scallops - Avoid or limit high fat processed meats such as sausage and bacon  Dairy - Include moderate amounts of low fat dairy products  - Focus on healthy dairy such as fat free yogurt, skim milk, low or reduced fat cheese - Limit dairy products higher in fat such as whole or 2% milk, cheese, ice cream  Alcohol - Moderate amounts of red wine is ok  - No more than 5 oz daily for women (all ages) and men older than age 65  - No more than 10 oz of wine daily for men younger than 65  Other - Limit sweets and other desserts  - Use herbs and spices instead of salt to flavor foods  - Herbs and spices common to the traditional Mediterranean Diet include: basil, bay leaves, chives, cloves, cumin, fennel, garlic, lavender, marjoram, mint, oregano, parsley, pepper, rosemary, sage, savory, sumac, tarragon, thyme   It's not just a diet,  it's a lifestyle:  . The Mediterranean diet includes lifestyle factors typical of those in the region  . Foods, drinks and meals are best eaten with others and savored . Daily physical activity is important for overall good health . This could be strenuous exercise like running and aerobics . This could also be more leisurely activities such as walking, housework, yard-work, or taking the stairs . Moderation is the key; a balanced and healthy diet accommodates most foods and drinks . Consider portion sizes and frequency of consumption of certain foods   Meal Ideas & Options:  . Breakfast:  o Whole wheat toast or whole wheat English muffins   with peanut butter & hard boiled egg o Steel cut oats topped with apples & cinnamon and skim milk  o Fresh fruit: banana, strawberries, melon, berries, peaches  o Smoothies: strawberries, bananas, greek yogurt, peanut butter o Low fat greek yogurt with blueberries and granola  o Egg white omelet with spinach and mushrooms o Breakfast couscous: whole wheat couscous, apricots, skim milk, cranberries  . Sandwiches:  o Hummus and grilled vegetables (peppers, zucchini, squash) on whole wheat bread   o Grilled chicken on whole wheat pita with lettuce, tomatoes, cucumbers or tzatziki  o Tuna salad on whole wheat bread: tuna salad made with greek yogurt, olives, red peppers, capers, green onions o Garlic rosemary lamb pita: lamb sauted with garlic, rosemary, salt & pepper; add lettuce, cucumber, greek yogurt to pita - flavor with lemon juice and black pepper  . Seafood:  o Mediterranean grilled salmon, seasoned with garlic, basil, parsley, lemon juice and black pepper o Shrimp, lemon, and spinach whole-grain pasta salad made with low fat greek yogurt  o Seared scallops with lemon orzo  o Seared tuna steaks seasoned salt, pepper, coriander topped with tomato mixture of olives, tomatoes, olive oil, minced garlic, parsley, green onions and cappers  . Meats:   o Herbed greek chicken salad with kalamata olives, cucumber, feta  o Red bell peppers stuffed with spinach, bulgur, lean ground beef (or lentils) & topped with feta   o Kebabs: skewers of chicken, tomatoes, onions, zucchini, squash  o [place] burgers: made with red onions, mint, dill, lemon juice, feta cheese topped with roasted red peppers . Vegetarian o Cucumber salad: cucumbers, artichoke hearts, celery, red onion, feta cheese, tossed in olive oil & lemon juice  o Hummus and whole grain pita points with a greek salad (lettuce, tomato, feta, olives, cucumbers, red onion) o Lentil soup with celery, carrots made with vegetable broth, garlic, salt and pepper  o Tabouli salad: parsley, bulgur, mint, scallions, cucumbers, tomato, radishes, lemon juice, olive oil, salt and pepper.      Hernia, Adult     A hernia happens when tissue inside your body pushes out through a weak spot in your belly muscles (abdominal wall). This makes a round lump (bulge). The lump may be:  In a scar from surgery that was done in your belly (incisional hernia).  Near your belly button (umbilical hernia).  In your groin (inguinal hernia). Your groin is the area where your leg meets your lower belly (abdomen). This kind of hernia could also be: ? In your scrotum, if you are female. ? In folds of skin around your vagina, if you are female.  In your upper thigh (femoral hernia).  Inside your belly (hiatal hernia). This happens when your stomach slides above the muscle between your belly and your chest (diaphragm). If your hernia is small and it does not cause pain, you may not need treatment. If your hernia is large or it causes pain, you may need surgery. Follow these instructions at home: Activity  Avoid stretching or overusing (straining) the muscles near your hernia. Straining can happen when you: ? Lift something heavy. ? Poop (have a bowel movement).  Do not lift anything that is heavier than 10 lb (4.5  kg), or the limit that you are told, until your doctor says that it is safe.  Use the strength of your legs when you lift something heavy. Do not use only your back muscles to lift. General instructions  Do these things if told by your  doctor so you do not have trouble pooping (constipation): ? Drink enough fluid to keep your pee (urine) pale yellow. ? Eat foods that are high in fiber. These include fresh fruits and vegetables, whole grains, and beans. ? Limit foods that are high in fat and processed sugars. These include foods that are fried or sweet. ? Take medicine for trouble pooping.  When you cough, try to cough gently.  You may try to push your hernia in by very gently pressing on it when you are lying down. Do not try to force the bulge back in if it will not push in easily.  If you are overweight, work with your doctor to lose weight safely.  Do not use any products that have nicotine or tobacco in them. These include cigarettes and e-cigarettes. If you need help quitting, ask your doctor.  If you will be having surgery (hernia repair), watch your hernia for changes in shape, size, or color. Tell your doctor if you see any changes.  Take over-the-counter and prescription medicines only as told by your doctor.  Keep all follow-up visits as told by your doctor. Contact a doctor if:  You get new pain, swelling, or redness near your hernia.  You poop fewer times in a week than normal.  You have trouble pooping.  You have poop (stool) that is more dry than normal.  You have poop that is harder or larger than normal. Get help right away if:  You have a fever.  You have belly pain that gets worse.  You feel sick to your stomach (nauseous).  You throw up (vomit).  Your hernia cannot be pushed in by very gently pressing on it when you are lying down. Do not try to force the bulge back in if it will not push in easily.  Your hernia: ? Changes in shape or  size. ? Changes color. ? Feels hard or it hurts when you touch it. These symptoms may represent a serious problem that is an emergency. Do not wait to see if the symptoms will go away. Get medical help right away. Call your local emergency services (911 in the [place] Summary  A hernia happens when tissue inside your body pushes out through a weak spot in the belly muscles. This creates a bulge.  If your hernia is small and it does not hurt, you may not need treatment. If your hernia is large or it hurts, you may need surgery.  If you will be having surgery, watch your hernia for changes in shape, size, or color. Tell your doctor about any changes. This information is not intended to replace advice given to you by your health care provider. Make sure you discuss any questions you have with your health care provider. Document Revised: 07/15/2018 Document Reviewed: 12/24/2016 Elsevier Patient Education  [place].

## 2020-03-22 NOTE — Progress Notes (Signed)
Pam Sanders is a 69 y.o. female presents to office today for annual physical examination.  She is here with her aide today. She is fasting this morning.   Concerns today include: 1. Back pain Lucella has chronic back pain. She reports that her back pain has been worse for awhile now. She has difficulty describing and rating her pain. Her aide reports that she complains of back pain throughout the day on most days. She takes tylenol without relief. She also uses a muscle rub and patches without relief. She is unsure of if she has every had steroid for her back pain before. Denies changes in bowel or bladder control.   Occupation: disabled, Substance use: denies Diet: working on portion control, eating fruits, vegetables, lean meats. She is no longer drinking soft drinks Exercise: mobility issues Last eye exam: within the last year Last dental exam: every 6 months, goes again in Feb 2022 Last colonoscopy: cologuard was done by patient but was not sent in Last mammogram: 03/11/19 Refills needed today: no  She is  Past Medical History:  Diagnosis Date  . Gait disturbance   . GERD (gastroesophageal reflux disease)   . HA (headache)   . Hernia of abdominal wall   . Hypertension   . Lower extremity edema   . Seizures (Diagonal)   . Thyroid disease   . Tremor    Social History   Socioeconomic History  . Marital status: Single    Spouse name: Not on file  . Number of children: 0  . Years of education: HS  . Highest education level: 12th grade  Occupational History  . Occupation: Disabled  Tobacco Use  . Smoking status: Never Smoker  . Smokeless tobacco: Never Used  Vaping Use  . Vaping Use: Never used  Substance and Sexual Activity  . Alcohol use: No  . Drug use: No  . Sexual activity: Not Currently    Birth control/protection: Post-menopausal  Other Topics Concern  . Not on file  Social History Narrative   Patient lives at home alone. Patient has a high school education.  Her sister comes in frequently to take care of medications, groceries. She now has an Aid that that comes in M-F for 16 hours a week that helps her shower and do oral care.   Caffeine - one soda daily.   Social Determinants of Health   Financial Resource Strain: Low Risk   . Difficulty of Paying Living Expenses: Not hard at all  Food Insecurity: No Food Insecurity  . Worried About Charity fundraiser in the Last Year: Never true  . Ran Out of Food in the Last Year: Never true  Transportation Needs: Unknown  . Lack of Transportation (Medical): Not on file  . Lack of Transportation (Non-Medical): No  Physical Activity: Inactive  . Days of Exercise per Week: 0 days  . Minutes of Exercise per Session: 0 min  Stress: No Stress Concern Present  . Feeling of Stress : Not at all  Social Connections: Socially Isolated  . Frequency of Communication with Friends and Family: More than three times a week  . Frequency of Social Gatherings with Friends and Family: More than three times a week  . Attends Religious Services: Never  . Active Member of Clubs or Organizations: No  . Attends Archivist Meetings: Never  . Marital Status: Never married  Intimate Partner Violence: Not At Risk  . Fear of Current or Ex-Partner: No  . Emotionally  Abused: No  . Physically Abused: No  . Sexually Abused: No   Past Surgical History:  Procedure Laterality Date  . CATARACT EXTRACTION, BILATERAL    . STOMACH SURGERY     Family History  Problem Relation Age of Onset  . Stroke Mother   . Stroke Father   . Stroke Other   . Diabetes Other   . Heart Problems Other   . Thyroid cancer Sister   . Hypertension Sister   . Emphysema Brother   . Other Brother        died at birth  . Aneurysm Brother        stomach  . Throat cancer Brother   . Emphysema Brother   . Heart disease Brother   . Stroke Brother   . Other Brother        MVA  . Other Sister        died at birth  . Aneurysm Sister         head    Current Outpatient Medications:  .  alum & mag hydroxide-simeth (MAALOX/MYLANTA) 200-200-20 MG/5ML suspension, Take 30 mLs by mouth every 6 (six) hours as needed for indigestion or heartburn., Disp: , Rfl:  .  atorvastatin (LIPITOR) 10 MG tablet, Take 1 tablet (10 mg total) by mouth daily., Disp: 90 tablet, Rfl: 1 .  cetirizine (ZYRTEC) 10 MG tablet, Take 1 tablet (10 mg total) by mouth daily. For allergy symptoms, Disp: 90 tablet, Rfl: 3 .  Cholecalciferol (VITAMIN D-3) 1000 UNITS CAPS, Take 1,000 Units by mouth daily. , Disp: , Rfl:  .  Clotrimazole 1 % OINT, Apply 1 application topically 2 (two) times daily., Disp: 56.7 g, Rfl: 0 .  famotidine (PEPCID) 20 MG tablet, Take 1 tablet (20 mg total) by mouth at bedtime., Disp: 90 tablet, Rfl: 1 .  fluticasone (FLONASE) 50 MCG/ACT nasal spray, Place 2 sprays into both nostrils at bedtime., Disp: 16 g, Rfl: 2 .  furosemide (LASIX) 20 MG tablet, Take 1 tablet (20 mg total) by mouth daily., Disp: 90 tablet, Rfl: 1 .  lamoTRIgine (LAMICTAL) 100 MG tablet, Take 1 tablet (100 mg total) by mouth 2 (two) times daily., Disp: 180 tablet, Rfl: 1 .  levothyroxine (SYNTHROID) 137 MCG tablet, TAKE 1 TABLET DAILY BEFORE BREAKFAST, Disp: 90 tablet, Rfl: 0 .  LINZESS 72 MCG capsule, TAKE 1 CAPSULE DAILY BEFORE BREAKFAST, Disp: 30 capsule, Rfl: 11 .  loratadine (CLARITIN) 10 MG tablet, Take 1 tablet (10 mg total) by mouth daily., Disp: 90 tablet, Rfl: 1 .  losartan (COZAAR) 100 MG tablet, Take 1 tablet (100 mg total) by mouth daily., Disp: 90 tablet, Rfl: 1 .  Melatonin 5 MG CAPS, Take 5 mg by mouth at bedtime., Disp: , Rfl:  .  Multiple Vitamin (MULTIVITAMIN WITH MINERALS) TABS tablet, Take 1 tablet by mouth daily., Disp: , Rfl:  .  Olopatadine HCl 0.2 % SOLN, Apply 1 drop to eye daily., Disp: 2.5 mL, Rfl: 0 .  Omega-3 Fatty Acids (FISH OIL) 1000 MG CAPS, Take 1 capsule by mouth daily. , Disp: , Rfl:  .  omeprazole (PRILOSEC) 20 MG capsule, TAKE 1 CAPSULE 2  TIMES A DAY BEFORE A MEAL, Disp: 60 capsule, Rfl: 5 .  potassium chloride SA (KLOR-CON) 20 MEQ tablet, TAKE  (1)  TABLET TWICE A DAY., Disp: 180 tablet, Rfl: 0 .  sertraline (ZOLOFT) 50 MG tablet, Take 1 tablet (50 mg total) by mouth daily., Disp: 90 tablet, Rfl: 1 .  spironolactone (ALDACTONE) 25 MG tablet, TAKE 1 TABLET ONCE DAILY, Disp: 90 tablet, Rfl: 0 .  thioridazine (MELLARIL) 50 MG tablet, Take 1 tablet (50 mg total) by mouth at bedtime., Disp: 90 tablet, Rfl: 1 .  topiramate (TOPAMAX) 100 MG tablet, TAKE  (1)  TABLET TWICE A DAY., Disp: 180 tablet, Rfl: 0 .  zolpidem (AMBIEN) 5 MG tablet, Take 1 tablet (5 mg total) by mouth at bedtime., Disp: 30 tablet, Rfl: 5  No Known Allergies   ROS: Review of Systems Pertinent items noted in HPI and remainder of comprehensive ROS otherwise negative.    Physical exam General appearance: alert, cooperative and no distress Head: Normocephalic, without obvious abnormality, atraumatic Eyes: conjunctivae/corneas clear. PERRL, EOM's intact.  Ears: normal TM's and external ear canals both ears Nose: Nares normal. Septum midline. Mucosa normal. No drainage or sinus tenderness. Throat: lips, mucosa, and tongue normal; teeth and gums normal Neck: no adenopathy, no carotid bruit, no JVD, supple, symmetrical, trachea midline and thyroid not enlarged, symmetric, no tenderness/mass/nodules Lungs: clear to auscultation bilaterally Heart: regular rate and rhythm, S1, S2 normal, no murmur, click, rub or gallop Abdomen: normal findings: bowel sounds normal and soft, non-tender and abnormal findings:  known hernia present Extremities: extremities normal, atraumatic, no cyanosis or edema Skin: Skin color, texture, turgor normal. No rashes or lesions Neurologic: alert and oriented. Gait abnormal (rolling walker). Mood normal, Behavior normal, thought content normal.    Assessment/ Plan: Clementeen Graham here for annual physical exam.   Tecia was seen today  for annual exam.  Diagnoses and all orders for this visit:  Encounter for general adult medical examination with abnormal findings Labs pending as below. Takes lasix and Cozaar.  -     CMP14+EGFR -     CBC with Differential/Platelet -     Lipid panel -     TSH  Essential hypertension Well controlled on current regimen.  -     CMP14+EGFR -     CBC with Differential/Platelet -     Lipid panel -     TSH  Dyslipidemia Well controlled on current regimen. Takes lipitor.  -     Lipid panel  Hypothyroidism, unspecified type Well controlled on current regimen.  -     TSH  Obesity (BMI 30-39.9) Reports weight loss. Working on portion control. -     CMP14+EGFR -     CBC with Differential/Platelet -     Lipid panel -     TSH  Gastroesophageal reflux disease without esophagitis Well controlled on current regimen. Takes pepcid.   Gait instability Using rolling walker.  Hernia of abdominal wall Not a surgical candidate.  Schizo-affective schizophrenia, chronic condition ([place]) Well controlled on current regimen. Takes zolofe and thioridazine.   Peripheral vascular disease ([place]) On statin therapy.  Intellectual disability  Epileptic grand mal status ([place]) Well controlled on current regimen. Takes lamotrigine and topamax.  -     Lamotrigine level  Chronic bilateral low back pain without sciatica Discussed with patient and her sister via telephone.  -     methylPREDNISolone acetate (DEPO-MEDROL) injection 80 mg  Need for 23-polyvalent pneumococcal polysaccharide vaccine -     Pneumococcal polysaccharide vaccine 23-valent greater than or equal to 2yo subcutaneous/IM  Counseled on healthy lifestyle choices, including diet (rich in fruits, vegetables and lean meats and low in salt and simple carbohydrates) and exercise (at least 30 minutes of moderate physical activity daily).  Patient to follow up with PCP in 6  months for chronic follow up.   The above assessment and  management plan was discussed with the patient. The patient verbalized understanding of and has agreed to the management plan. Patient is aware to call the clinic if symptoms persist or worsen. Patient is aware when to return to the clinic for a follow-up visit. Patient educated on when it is appropriate to go to the emergency department.   Marjorie Smolder, FNP-C [facility] [address] [place], [place] [address] [phone]

## 2020-03-23 LAB — TSH: TSH: 0.464 u[IU]/mL (ref 0.450–4.500)

## 2020-03-23 LAB — CMP14+EGFR
ALT: 12 IU/L (ref 0–32)
AST: 17 IU/L (ref 0–40)
Albumin/Globulin Ratio: 1.8 (ref 1.2–2.2)
Albumin: 4.8 g/dL (ref 3.8–4.8)
Alkaline Phosphatase: 103 IU/L (ref 44–121)
BUN/Creatinine Ratio: 15 (ref 12–28)
BUN: 19 mg/dL (ref 8–27)
Bilirubin Total: 0.2 mg/dL (ref 0.0–1.2)
CO2: 22 mmol/L (ref 20–29)
Calcium: 9.8 mg/dL (ref 8.7–10.3)
Chloride: 97 mmol/L (ref 96–106)
Creatinine, Ser: 1.29 mg/dL — ABNORMAL HIGH (ref 0.57–1.00)
GFR calc Af Amer: 49 mL/min/{1.73_m2} — ABNORMAL LOW (ref >59)
GFR calc non Af Amer: 42 mL/min/{1.73_m2} — ABNORMAL LOW (ref >59)
Globulin, Total: 2.7 g/dL (ref 1.5–4.5)
Glucose: 93 mg/dL (ref 65–99)
Potassium: 4.1 mmol/L (ref 3.5–5.2)
Sodium: 133 mmol/L — ABNORMAL LOW (ref 134–144)
Total Protein: 7.5 g/dL (ref 6.0–8.5)

## 2020-03-23 LAB — LAMOTRIGINE LEVEL: Lamotrigine Lvl: 2.9 ug/mL (ref 2.0–20.0)

## 2020-03-23 LAB — CBC WITH DIFFERENTIAL/PLATELET
Basophils Absolute: 0 10*3/uL (ref 0.0–0.2)
Basos: 0 %
EOS (ABSOLUTE): 0.1 10*3/uL (ref 0.0–0.4)
Eos: 1 %
Hematocrit: 36 % (ref 34.0–46.6)
Hemoglobin: 12.3 g/dL (ref 11.1–15.9)
Immature Grans (Abs): 0 10*3/uL (ref 0.0–0.1)
Immature Granulocytes: 0 %
Lymphocytes Absolute: 1.7 10*3/uL (ref 0.7–3.1)
Lymphs: 17 %
MCH: 32.9 pg (ref 26.6–33.0)
MCHC: 34.2 g/dL (ref 31.5–35.7)
MCV: 96 fL (ref 79–97)
Monocytes Absolute: 0.9 10*3/uL (ref 0.1–0.9)
Monocytes: 9 %
Neutrophils Absolute: 6.9 10*3/uL (ref 1.4–7.0)
Neutrophils: 73 %
Platelets: 255 10*3/uL (ref 150–450)
RBC: 3.74 x10E6/uL — ABNORMAL LOW (ref 3.77–5.28)
RDW: 13.3 % (ref 11.7–15.4)
WBC: 9.5 10*3/uL (ref 3.4–10.8)

## 2020-03-23 LAB — LIPID PANEL
Chol/HDL Ratio: 3.6 ratio (ref 0.0–4.4)
Cholesterol, Total: 171 mg/dL (ref 100–199)
HDL: 47 mg/dL (ref >39)
LDL Chol Calc (NIH): 105 mg/dL — ABNORMAL HIGH (ref 0–99)
Triglycerides: 105 mg/dL (ref 0–149)
VLDL Cholesterol Cal: 19 mg/dL (ref 5–40)

## 2020-03-28 ENCOUNTER — Other Ambulatory Visit: Payer: Self-pay | Admitting: Family Medicine

## 2020-03-28 DIAGNOSIS — I10 Essential (primary) hypertension: Secondary | ICD-10-CM

## 2020-03-28 DIAGNOSIS — K219 Gastro-esophageal reflux disease without esophagitis: Secondary | ICD-10-CM

## 2020-03-28 DIAGNOSIS — F259 Schizoaffective disorder, unspecified: Secondary | ICD-10-CM

## 2020-04-04 ENCOUNTER — Telehealth: Payer: Self-pay

## 2020-04-04 NOTE — Telephone Encounter (Signed)
Pam Sanders from [facility] called for verbal orders to visit patient in 2 months to verify she is receiving adequate help in her home.  Verbal orders ok to do so.

## 2020-04-18 ENCOUNTER — Ambulatory Visit (INDEPENDENT_AMBULATORY_CARE_PROVIDER_SITE_OTHER): Payer: Medicare Other

## 2020-04-18 ENCOUNTER — Other Ambulatory Visit: Payer: Self-pay

## 2020-04-18 DIAGNOSIS — E039 Hypothyroidism, unspecified: Secondary | ICD-10-CM | POA: Diagnosis not present

## 2020-04-18 DIAGNOSIS — G40909 Epilepsy, unspecified, not intractable, without status epilepticus: Secondary | ICD-10-CM

## 2020-04-18 DIAGNOSIS — Q048 Other specified congenital malformations of brain: Secondary | ICD-10-CM | POA: Diagnosis not present

## 2020-04-18 DIAGNOSIS — I739 Peripheral vascular disease, unspecified: Secondary | ICD-10-CM | POA: Diagnosis not present

## 2020-04-18 DIAGNOSIS — K219 Gastro-esophageal reflux disease without esophagitis: Secondary | ICD-10-CM

## 2020-04-18 DIAGNOSIS — I129 Hypertensive chronic kidney disease with stage 1 through stage 4 chronic kidney disease, or unspecified chronic kidney disease: Secondary | ICD-10-CM

## 2020-04-18 DIAGNOSIS — N183 Chronic kidney disease, stage 3 unspecified: Secondary | ICD-10-CM

## 2020-04-18 DIAGNOSIS — E785 Hyperlipidemia, unspecified: Secondary | ICD-10-CM

## 2020-04-18 DIAGNOSIS — D631 Anemia in chronic kidney disease: Secondary | ICD-10-CM

## 2020-04-18 DIAGNOSIS — D539 Nutritional anemia, unspecified: Secondary | ICD-10-CM

## 2020-04-18 DIAGNOSIS — Z9181 History of falling: Secondary | ICD-10-CM

## 2020-04-18 DIAGNOSIS — M1990 Unspecified osteoarthritis, unspecified site: Secondary | ICD-10-CM | POA: Diagnosis not present

## 2020-04-18 DIAGNOSIS — Z79899 Other long term (current) drug therapy: Secondary | ICD-10-CM

## 2020-04-18 DIAGNOSIS — E669 Obesity, unspecified: Secondary | ICD-10-CM

## 2020-04-18 DIAGNOSIS — F259 Schizoaffective disorder, unspecified: Secondary | ICD-10-CM

## 2020-04-26 ENCOUNTER — Other Ambulatory Visit: Payer: Self-pay | Admitting: Family Medicine

## 2020-04-26 DIAGNOSIS — F259 Schizoaffective disorder, unspecified: Secondary | ICD-10-CM

## 2020-04-26 DIAGNOSIS — J309 Allergic rhinitis, unspecified: Secondary | ICD-10-CM

## 2020-05-07 DIAGNOSIS — R404 Transient alteration of awareness: Secondary | ICD-10-CM | POA: Diagnosis not present

## 2020-05-26 ENCOUNTER — Other Ambulatory Visit: Payer: Self-pay | Admitting: Family Medicine

## 2020-05-26 DIAGNOSIS — G40401 Other generalized epilepsy and epileptic syndromes, not intractable, with status epilepticus: Secondary | ICD-10-CM

## 2020-05-26 DIAGNOSIS — L299 Pruritus, unspecified: Secondary | ICD-10-CM

## 2020-05-26 DIAGNOSIS — E039 Hypothyroidism, unspecified: Secondary | ICD-10-CM

## 2020-06-04 DIAGNOSIS — E039 Hypothyroidism, unspecified: Secondary | ICD-10-CM | POA: Diagnosis not present

## 2020-06-04 DIAGNOSIS — I129 Hypertensive chronic kidney disease with stage 1 through stage 4 chronic kidney disease, or unspecified chronic kidney disease: Secondary | ICD-10-CM | POA: Diagnosis not present

## 2020-06-04 DIAGNOSIS — D631 Anemia in chronic kidney disease: Secondary | ICD-10-CM | POA: Diagnosis not present

## 2020-06-04 DIAGNOSIS — M1990 Unspecified osteoarthritis, unspecified site: Secondary | ICD-10-CM | POA: Diagnosis not present

## 2020-06-04 DIAGNOSIS — K219 Gastro-esophageal reflux disease without esophagitis: Secondary | ICD-10-CM | POA: Diagnosis not present

## 2020-06-04 DIAGNOSIS — I739 Peripheral vascular disease, unspecified: Secondary | ICD-10-CM | POA: Diagnosis not present

## 2020-06-04 DIAGNOSIS — E785 Hyperlipidemia, unspecified: Secondary | ICD-10-CM | POA: Diagnosis not present

## 2020-06-04 DIAGNOSIS — N183 Chronic kidney disease, stage 3 unspecified: Secondary | ICD-10-CM | POA: Diagnosis not present

## 2020-06-04 DIAGNOSIS — Q048 Other specified congenital malformations of brain: Secondary | ICD-10-CM | POA: Diagnosis not present

## 2020-06-04 DIAGNOSIS — D539 Nutritional anemia, unspecified: Secondary | ICD-10-CM | POA: Diagnosis not present

## 2020-07-02 ENCOUNTER — Other Ambulatory Visit: Payer: Self-pay | Admitting: Family Medicine

## 2020-07-02 DIAGNOSIS — I10 Essential (primary) hypertension: Secondary | ICD-10-CM

## 2020-07-02 DIAGNOSIS — K219 Gastro-esophageal reflux disease without esophagitis: Secondary | ICD-10-CM

## 2020-07-02 DIAGNOSIS — F259 Schizoaffective disorder, unspecified: Secondary | ICD-10-CM

## 2020-07-20 DIAGNOSIS — H905 Unspecified sensorineural hearing loss: Secondary | ICD-10-CM | POA: Diagnosis not present

## 2020-08-02 DIAGNOSIS — I129 Hypertensive chronic kidney disease with stage 1 through stage 4 chronic kidney disease, or unspecified chronic kidney disease: Secondary | ICD-10-CM | POA: Diagnosis not present

## 2020-08-02 DIAGNOSIS — K219 Gastro-esophageal reflux disease without esophagitis: Secondary | ICD-10-CM | POA: Diagnosis not present

## 2020-08-02 DIAGNOSIS — E039 Hypothyroidism, unspecified: Secondary | ICD-10-CM | POA: Diagnosis not present

## 2020-08-02 DIAGNOSIS — D631 Anemia in chronic kidney disease: Secondary | ICD-10-CM | POA: Diagnosis not present

## 2020-08-02 DIAGNOSIS — M1990 Unspecified osteoarthritis, unspecified site: Secondary | ICD-10-CM | POA: Diagnosis not present

## 2020-08-02 DIAGNOSIS — D539 Nutritional anemia, unspecified: Secondary | ICD-10-CM | POA: Diagnosis not present

## 2020-08-02 DIAGNOSIS — I739 Peripheral vascular disease, unspecified: Secondary | ICD-10-CM | POA: Diagnosis not present

## 2020-08-02 DIAGNOSIS — Q048 Other specified congenital malformations of brain: Secondary | ICD-10-CM | POA: Diagnosis not present

## 2020-08-02 DIAGNOSIS — N183 Chronic kidney disease, stage 3 unspecified: Secondary | ICD-10-CM | POA: Diagnosis not present

## 2020-08-02 DIAGNOSIS — E785 Hyperlipidemia, unspecified: Secondary | ICD-10-CM | POA: Diagnosis not present

## 2020-08-06 DIAGNOSIS — N1832 Chronic kidney disease, stage 3b: Secondary | ICD-10-CM | POA: Diagnosis not present

## 2020-08-06 DIAGNOSIS — I129 Hypertensive chronic kidney disease with stage 1 through stage 4 chronic kidney disease, or unspecified chronic kidney disease: Secondary | ICD-10-CM | POA: Diagnosis not present

## 2020-08-06 DIAGNOSIS — N2581 Secondary hyperparathyroidism of renal origin: Secondary | ICD-10-CM | POA: Diagnosis not present

## 2020-08-14 ENCOUNTER — Other Ambulatory Visit: Payer: Self-pay

## 2020-08-14 ENCOUNTER — Ambulatory Visit (INDEPENDENT_AMBULATORY_CARE_PROVIDER_SITE_OTHER): Payer: Medicare Other

## 2020-08-14 DIAGNOSIS — E785 Hyperlipidemia, unspecified: Secondary | ICD-10-CM

## 2020-08-14 DIAGNOSIS — M1990 Unspecified osteoarthritis, unspecified site: Secondary | ICD-10-CM | POA: Diagnosis not present

## 2020-08-14 DIAGNOSIS — D539 Nutritional anemia, unspecified: Secondary | ICD-10-CM | POA: Diagnosis not present

## 2020-08-14 DIAGNOSIS — K219 Gastro-esophageal reflux disease without esophagitis: Secondary | ICD-10-CM

## 2020-08-14 DIAGNOSIS — G40909 Epilepsy, unspecified, not intractable, without status epilepticus: Secondary | ICD-10-CM

## 2020-08-14 DIAGNOSIS — Z79899 Other long term (current) drug therapy: Secondary | ICD-10-CM

## 2020-08-14 DIAGNOSIS — E039 Hypothyroidism, unspecified: Secondary | ICD-10-CM

## 2020-08-14 DIAGNOSIS — D631 Anemia in chronic kidney disease: Secondary | ICD-10-CM

## 2020-08-14 DIAGNOSIS — Z9181 History of falling: Secondary | ICD-10-CM

## 2020-08-14 DIAGNOSIS — Q048 Other specified congenital malformations of brain: Secondary | ICD-10-CM

## 2020-08-14 DIAGNOSIS — E669 Obesity, unspecified: Secondary | ICD-10-CM

## 2020-08-14 DIAGNOSIS — I129 Hypertensive chronic kidney disease with stage 1 through stage 4 chronic kidney disease, or unspecified chronic kidney disease: Secondary | ICD-10-CM | POA: Diagnosis not present

## 2020-08-14 DIAGNOSIS — I739 Peripheral vascular disease, unspecified: Secondary | ICD-10-CM

## 2020-08-14 DIAGNOSIS — F259 Schizoaffective disorder, unspecified: Secondary | ICD-10-CM

## 2020-08-14 DIAGNOSIS — N183 Chronic kidney disease, stage 3 unspecified: Secondary | ICD-10-CM

## 2020-08-23 DIAGNOSIS — K432 Incisional hernia without obstruction or gangrene: Secondary | ICD-10-CM | POA: Diagnosis not present

## 2020-09-20 ENCOUNTER — Other Ambulatory Visit: Payer: Self-pay

## 2020-09-20 ENCOUNTER — Ambulatory Visit (INDEPENDENT_AMBULATORY_CARE_PROVIDER_SITE_OTHER): Payer: Medicare Other | Admitting: Family Medicine

## 2020-09-20 ENCOUNTER — Ambulatory Visit: Payer: Medicare Other

## 2020-09-20 ENCOUNTER — Encounter: Payer: Self-pay | Admitting: Family Medicine

## 2020-09-20 VITALS — BP 108/63 | HR 63 | Temp 97.0°F | Ht 61.0 in | Wt 176.8 lb

## 2020-09-20 DIAGNOSIS — I739 Peripheral vascular disease, unspecified: Secondary | ICD-10-CM

## 2020-09-20 DIAGNOSIS — N1832 Chronic kidney disease, stage 3b: Secondary | ICD-10-CM | POA: Diagnosis not present

## 2020-09-20 DIAGNOSIS — Z780 Asymptomatic menopausal state: Secondary | ICD-10-CM

## 2020-09-20 DIAGNOSIS — J309 Allergic rhinitis, unspecified: Secondary | ICD-10-CM

## 2020-09-20 DIAGNOSIS — E785 Hyperlipidemia, unspecified: Secondary | ICD-10-CM | POA: Diagnosis not present

## 2020-09-20 DIAGNOSIS — Z23 Encounter for immunization: Secondary | ICD-10-CM

## 2020-09-20 DIAGNOSIS — R600 Localized edema: Secondary | ICD-10-CM | POA: Diagnosis not present

## 2020-09-20 DIAGNOSIS — K439 Ventral hernia without obstruction or gangrene: Secondary | ICD-10-CM | POA: Diagnosis not present

## 2020-09-20 DIAGNOSIS — K219 Gastro-esophageal reflux disease without esophagitis: Secondary | ICD-10-CM

## 2020-09-20 DIAGNOSIS — H900 Conductive hearing loss, bilateral: Secondary | ICD-10-CM

## 2020-09-20 DIAGNOSIS — Z01818 Encounter for other preprocedural examination: Secondary | ICD-10-CM | POA: Diagnosis not present

## 2020-09-20 DIAGNOSIS — F259 Schizoaffective disorder, unspecified: Secondary | ICD-10-CM

## 2020-09-20 DIAGNOSIS — G479 Sleep disorder, unspecified: Secondary | ICD-10-CM

## 2020-09-20 DIAGNOSIS — E039 Hypothyroidism, unspecified: Secondary | ICD-10-CM

## 2020-09-20 DIAGNOSIS — F79 Unspecified intellectual disabilities: Secondary | ICD-10-CM

## 2020-09-20 DIAGNOSIS — Z79899 Other long term (current) drug therapy: Secondary | ICD-10-CM

## 2020-09-20 DIAGNOSIS — G40401 Other generalized epilepsy and epileptic syndromes, not intractable, with status epilepticus: Secondary | ICD-10-CM

## 2020-09-20 DIAGNOSIS — I10 Essential (primary) hypertension: Secondary | ICD-10-CM | POA: Diagnosis not present

## 2020-09-20 DIAGNOSIS — E669 Obesity, unspecified: Secondary | ICD-10-CM

## 2020-09-20 MED ORDER — LOSARTAN POTASSIUM 50 MG PO TABS: 50.0000 mg | ORAL_TABLET | Freq: Every day | ORAL | 1 refills | Status: DC

## 2020-09-20 MED ORDER — ATORVASTATIN CALCIUM 10 MG PO TABS: 10.0000 mg | ORAL_TABLET | Freq: Every day | ORAL | 1 refills | Status: DC

## 2020-09-20 MED ORDER — FAMOTIDINE 20 MG PO TABS: 20.0000 mg | ORAL_TABLET | Freq: Every day | ORAL | 1 refills | Status: DC

## 2020-09-20 MED ORDER — SPIRONOLACTONE 25 MG PO TABS: 1.0000 | ORAL_TABLET | Freq: Every day | ORAL | 1 refills | Status: DC

## 2020-09-20 MED ORDER — SERTRALINE HCL 50 MG PO TABS: 1.0000 | ORAL_TABLET | Freq: Every day | ORAL | 1 refills | Status: DC

## 2020-09-20 MED ORDER — THIORIDAZINE HCL 50 MG PO TABS: 50.0000 mg | ORAL_TABLET | Freq: Every day | ORAL | 1 refills | Status: DC

## 2020-09-20 MED ORDER — FUROSEMIDE 20 MG PO TABS
20.0000 mg | ORAL_TABLET | Freq: Every day | ORAL | 1 refills | Status: DC
Start: 2020-09-20 — End: 2021-03-21

## 2020-09-20 MED ORDER — POTASSIUM CHLORIDE CRYS ER 20 MEQ PO TBCR: EXTENDED_RELEASE_TABLET | ORAL | 1 refills | Status: DC

## 2020-09-20 NOTE — Progress Notes (Signed)
Assessment & Plan:  1. Essential hypertension Soft blood pressures. Losartan decreased from 100 mg to 50 mg once daily. Continue healthy eating and exercise.  - furosemide (LASIX) 20 MG tablet; Take 1 tablet (20 mg total) by mouth daily.  Dispense: 90 tablet; Refill: 1 - potassium chloride SA (KLOR-CON) 20 MEQ tablet; TAKE  (1)  TABLET TWICE A DAY.  Dispense: 180 tablet; Refill: 1 - spironolactone (ALDACTONE) 25 MG tablet; Take 1 tablet (25 mg total) by mouth daily.  Dispense: 90 tablet; Refill: 1 - CBC with Differential/Platelet - CMP14+EGFR - Lipid panel - losartan (COZAAR) 50 MG tablet; Take 1 tablet (50 mg total) by mouth daily.  Dispense: 90 tablet; Refill: 1  2. Dyslipidemia Labs to assess.  - CMP14+EGFR - Lipid panel - atorvastatin (LIPITOR) 10 MG tablet; Take 1 tablet (10 mg total) by mouth daily.  Dispense: 90 tablet; Refill: 1  3. Peripheral vascular disease ([place]) On a statin. - CMP14+EGFR - Lipid panel - atorvastatin (LIPITOR) 10 MG tablet; Take 1 tablet (10 mg total) by mouth daily.  Dispense: 90 tablet; Refill: 1  4. Lower extremity edema Well controlled on current regimen.  - CMP14+EGFR  5. Stage 3b chronic kidney disease (HCC) - CMP14+EGFR  6. Acquired hypothyroidism Labs to assess. - TSH  7. Gastroesophageal reflux disease without esophagitis Well controlled on current regimen.  - famotidine (PEPCID) 20 MG tablet; Take 1 tablet (20 mg total) by mouth at bedtime.  Dispense: 90 tablet; Refill: 1 - CMP14+EGFR  8. Schizo-affective schizophrenia, chronic condition ([place]) Well controlled on current regimen.  - thioridazine (MELLARIL) 50 MG tablet; Take 1 tablet (50 mg total) by mouth at bedtime.  Dispense: 90 tablet; Refill: 1 - sertraline (ZOLOFT) 50 MG tablet; Take 1 tablet (50 mg total) by mouth daily.  Dispense: 90 tablet; Refill: 1 - CMP14+EGFR  9. Difficulty sleeping Patient is no longer taking anything for sleep and has no complaints about not  sleeping. Ambien not refilled since she has been doing fine without it for over a year.  - CMP14+EGFR  10. Epileptic grand mal status ([place]) Well controlled on current regimen.  - CMP14+EGFR - Lamotrigine level  11. Hernia of abdominal wall Managed by Dr. Kathreen Cosier. Patient plans to have surgery.  12. Pre-op examination Discussed that typically a form is provided for clearance and that labs have to be within 30 days of surgery. If her surgery is within 30 days, she is cleared. If not, they will need to return for clearance. - CBC with Differential/Platelet - CMP14+EGFR - Lipid panel - TSH - Lamotrigine level  13. Conductive hearing loss, bilateral Wears hearing aids bilaterally.  14. Intellectual disability Lives alone. Sister checks on her. She does have a caregiver that comes in.   15. Allergic rhinitis, unspecified seasonality, unspecified trigger Well controlled on current regimen.   16. Obesity (BMI 30-39.9) 72 lbs lost in 3.5 years. 22 lbs have been in the past year. Continue health eating, small portions, and exercise.   17. Medication management Patient is agreeable to letting [facility] prepare her medications.  18. Post-menopausal - DG [facility] DEXA; Future  19. Immunization due - Varicella-zoster vaccine IM (Shingrix) - given in office.    Return in about 6 months (around 03/22/2021) for annual physical.  Hendricks Limes, MSN, APRN, FNP-C [facility]  Subjective:    Patient ID: Pam Sanders, female    DOB: Jan 08, 1951, 70 y.o.   MRN: [id number]  Patient Care Team: Hendricks Limes  F, FNP as PCP - General (Family Medicine) Danie Binder, MD (Inactive) as Consulting Physician (Gastroenterology) Marcial Pacas, MD as Consulting Physician (Neurology)   Chief Complaint:  Chief Complaint  Patient presents with   Hypertension   Hypothyroidism    6 month follow up of chronic medical conditions     HPI: Pam Sanders is a 70 y.o.  female presenting on 09/20/2020 for Hypertension and Hypothyroidism (6 month follow up of chronic medical conditions )  Patient is accompanied by her sister, who she is okay with being present.  Hernia: currently managed by Dr. Kathreen Cosier. She is scheduled to have a CT scan next week to assess the abdominal wall anatomy and the content of the hernia. They were asked to work on getting preoperative clearance from PCP.   Patient takes her own medications and according to her sister often makes mistakes such as taking something four times a day just because it is blue and she likes the color blue.   Patient has not had her Ambien prescription in over a year. She believes she is still taking it nightly. It is unclear what she is taking that she believes is helping her with sleep.   Obesity: patient has been working hard on eating smaller portions and losing weight so that she can have her hernia surgery.   Intellectual disability: patient's sister reports she is becoming combative at times and has been getting lost when out walking.  Depression screen [facility] 2/9 09/20/2020 03/22/2020 02/29/2020  Decreased Interest 0 0 0  Down, Depressed, Hopeless 0 0 0  PHQ - 2 Score 0 0 0  Altered sleeping 0 0 -  Tired, decreased energy 0 0 -  Change in appetite 0 0 -  Feeling bad or failure about yourself  0 0 -  Trouble concentrating 0 0 -  Moving slowly or fidgety/restless 0 0 -  Suicidal thoughts 0 0 -  PHQ-9 Score 0 0 -  Difficult doing work/chores Not difficult at all Not difficult at all -   GAD 7 : Generalized Anxiety Score 09/20/2020 09/06/2019  Nervous, Anxious, on Edge 1 0  Control/stop worrying 1 0  Worry too much - different things 2 0  Trouble relaxing 1 0  Restless 1 0  Easily annoyed or irritable 2 0  Afraid - awful might happen 0 0  Total GAD 7 Score 8 0  Anxiety Difficulty Not difficult at all -    New complaints: None  Social history:  Relevant past medical, surgical, family and  social history reviewed and updated as indicated. Interim medical history since our last visit reviewed.  Allergies and medications reviewed and updated.  DATA REVIEWED: CHART IN EPIC  ROS: Negative unless specifically indicated above in HPI.    Current Outpatient Medications:    alum & mag hydroxide-simeth (MAALOX/MYLANTA) 200-200-20 MG/5ML suspension, Take 30 mLs by mouth every 6 (six) hours as needed for indigestion or heartburn., Disp: , Rfl:    cetirizine (ZYRTEC) 10 MG tablet, TAKE 1 TABLET DAILY FOR ALLERGY SYMPTOMS, Disp: 30 tablet, Rfl: 5   Cholecalciferol (VITAMIN D-3) 1000 UNITS CAPS, Take 1,000 Units by mouth daily. , Disp: , Rfl:    Clotrimazole 1 % OINT, Apply 1 application topically 2 (two) times daily., Disp: 56.7 g, Rfl: 0   famotidine (PEPCID) 20 MG tablet, TAKE ONE TABLET AT BEDTIME, Disp: 90 tablet, Rfl: 0   fluticasone (FLONASE) 50 MCG/ACT nasal spray, USE 2 SPRAYS INTO EACH NOSTRIL ONCE DAILY AT  BEDTIME, Disp: 16 g, Rfl: 5   furosemide (LASIX) 20 MG tablet, TAKE 1 TABLET DAILY, Disp: 90 tablet, Rfl: 0   lamoTRIgine (LAMICTAL) 100 MG tablet, TAKE  (1)  TABLET TWICE A DAY., Disp: 180 tablet, Rfl: 1   levothyroxine (SYNTHROID) 137 MCG tablet, TAKE 1 TABLET DAILY BEFORE BREAKFAST, Disp: 90 tablet, Rfl: 2   LINZESS 72 MCG capsule, TAKE 1 CAPSULE DAILY BEFORE BREAKFAST, Disp: 30 capsule, Rfl: 11   loratadine (CLARITIN) 10 MG tablet, Take 1 tablet (10 mg total) by mouth daily., Disp: 90 tablet, Rfl: 1   losartan (COZAAR) 100 MG tablet, TAKE 1 TABLET DAILY, Disp: 90 tablet, Rfl: 0   Melatonin 5 MG CAPS, Take 5 mg by mouth at bedtime., Disp: , Rfl:    Multiple Vitamin (MULTIVITAMIN WITH MINERALS) TABS tablet, Take 1 tablet by mouth daily., Disp: , Rfl:    Olopatadine HCl 0.2 % SOLN, Apply 1 drop to eye daily., Disp: 2.5 mL, Rfl: 0   Omega-3 Fatty Acids (FISH OIL) 1000 MG CAPS, Take 1 capsule by mouth daily. , Disp: , Rfl:    omeprazole (PRILOSEC) 20 MG capsule, TAKE 1 CAPSULE 2  TIMES A DAY BEFORE A MEAL, Disp: 60 capsule, Rfl: 5   potassium chloride SA (KLOR-CON) 20 MEQ tablet, TAKE  (1)  TABLET TWICE A DAY., Disp: 180 tablet, Rfl: 0   sertraline (ZOLOFT) 50 MG tablet, TAKE 1 TABLET DAILY, Disp: 90 tablet, Rfl: 0   spironolactone (ALDACTONE) 25 MG tablet, TAKE 1 TABLET ONCE DAILY, Disp: 90 tablet, Rfl: 0   thioridazine (MELLARIL) 50 MG tablet, TAKE ONE TABLET AT BEDTIME, Disp: 90 tablet, Rfl: 1   topiramate (TOPAMAX) 100 MG tablet, TAKE  (1)  TABLET TWICE A DAY., Disp: 180 tablet, Rfl: 1   zolpidem (AMBIEN) 5 MG tablet, Take 1 tablet (5 mg total) by mouth at bedtime., Disp: 30 tablet, Rfl: 5   No Known Allergies Past Medical History:  Diagnosis Date   Gait disturbance    GERD (gastroesophageal reflux disease)    HA (headache)    Hernia of abdominal wall    Hypertension    Lower extremity edema    Seizures (HCC)    Thyroid disease    Tremor     Past Surgical History:  Procedure Laterality Date   CATARACT EXTRACTION, BILATERAL     STOMACH SURGERY      Social History   Socioeconomic History   Marital status: Single    Spouse name: Not on file   Number of children: 0   Years of education: HS   Highest education level: 12th grade  Occupational History   Occupation: Disabled  Tobacco Use   Smoking status: Never   Smokeless tobacco: Never  Vaping Use   Vaping Use: Never used  Substance and Sexual Activity   Alcohol use: No   Drug use: No   Sexual activity: Not Currently    Birth control/protection: Post-menopausal  Other Topics Concern   Not on file  Social History Narrative   Patient lives at home alone. Patient has a high school education. Her sister comes in frequently to take care of medications, groceries. She now has an Aid that that comes in M-F for 16 hours a week that helps her shower and do oral care.   Caffeine - one soda daily.   Social Determinants of Health   Financial Resource Strain: Low Risk    Difficulty of Paying Living  Expenses: Not hard at all  Food  Insecurity: No Food Insecurity   Worried About Charity fundraiser in the Last Year: Never true   Ran Out of Food in the Last Year: Never true  Transportation Needs: Unknown   Lack of Transportation (Medical): Not on file   Lack of Transportation (Non-Medical): No  Physical Activity: Inactive   Days of Exercise per Week: 0 days   Minutes of Exercise per Session: 0 min  Stress: No Stress Concern Present   Feeling of Stress : Not at all  Social Connections: Socially Isolated   Frequency of Communication with Friends and Family: More than three times a week   Frequency of Social Gatherings with Friends and Family: More than three times a week   Attends Religious Services: Never   Marine scientist or Organizations: No   Attends Music therapist: Never   Marital Status: Never married  Human resources officer Violence: Not At Risk   Fear of Current or Ex-Partner: No   Emotionally Abused: No   Physically Abused: No   Sexually Abused: No        Objective:    BP 108/63   Pulse 63   Temp (!) 97 F (36.1 C) (Temporal)   Ht 5' 1" (1.549 m)   Wt 176 lb 12.8 oz (80.2 kg)   SpO2 99%   BMI 33.41 kg/m   Wt Readings from Last 3 Encounters:  09/20/20 176 lb 12.8 oz (80.2 kg)  03/22/20 183 lb (83 kg)  02/29/20 198 lb 6.6 oz (90 kg)    Physical Exam Vitals reviewed.  Constitutional:      General: She is not in acute distress.    Appearance: Normal appearance. She is obese. She is not ill-appearing, toxic-appearing or diaphoretic.  HENT:     Head: Normocephalic and atraumatic.     Right Ear: Tympanic membrane, ear canal and external ear normal. There is no impacted cerumen.     Left Ear: Tympanic membrane, ear canal and external ear normal. There is no impacted cerumen.     Nose: Nose normal. No congestion or rhinorrhea.     Mouth/Throat:     Mouth: Mucous membranes are moist.     Pharynx: Oropharynx is clear. No oropharyngeal exudate or  posterior oropharyngeal erythema.  Eyes:     General: No scleral icterus.       Right eye: No discharge.        Left eye: No discharge.     Conjunctiva/sclera: Conjunctivae normal.     Pupils: Pupils are equal, round, and reactive to light.  Cardiovascular:     Rate and Rhythm: Normal rate and regular rhythm.     Heart sounds: Normal heart sounds. No murmur heard.   No friction rub. No gallop.  Pulmonary:     Effort: Pulmonary effort is normal. No respiratory distress.     Breath sounds: Normal breath sounds. No stridor. No wheezing, rhonchi or rales.  Abdominal:     General: Abdomen is flat. Bowel sounds are normal.     Palpations: Abdomen is soft. There is no hepatomegaly, splenomegaly or mass.     Tenderness: There is no abdominal tenderness. There is no guarding or rebound.     Hernia: A hernia is present. Hernia is present in the ventral area.  Musculoskeletal:        General: Normal range of motion.     Cervical back: Normal range of motion and neck supple. No rigidity. No muscular tenderness.  Lymphadenopathy:  Cervical: No cervical adenopathy.  Skin:    General: Skin is warm and dry.     Capillary Refill: Capillary refill takes less than 2 seconds.  Neurological:     General: No focal deficit present.     Mental Status: She is alert and oriented to person, place, and time. Mental status is at baseline.     Gait: Gait abnormal (walks with rolling walker).  Psychiatric:        Mood and Affect: Mood normal.        Behavior: Behavior normal.        Thought Content: Thought content normal.        Judgment: Judgment normal.    Lab Results  Component Value Date   TSH 0.464 03/22/2020   Lab Results  Component Value Date   WBC 9.5 03/22/2020   HGB 12.3 03/22/2020   HCT 36.0 03/22/2020   MCV 96 03/22/2020   PLT 255 03/22/2020   Lab Results  Component Value Date   NA 133 (L) 03/22/2020   K 4.1 03/22/2020   CO2 22 03/22/2020   GLUCOSE 93 03/22/2020   BUN 19  03/22/2020   CREATININE 1.29 (H) 03/22/2020   BILITOT 0.2 03/22/2020   ALKPHOS 103 03/22/2020   AST 17 03/22/2020   ALT 12 03/22/2020   PROT 7.5 03/22/2020   ALBUMIN 4.8 03/22/2020   CALCIUM 9.8 03/22/2020   ANIONGAP 13 09/30/2018   Lab Results  Component Value Date   CHOL 171 03/22/2020   Lab Results  Component Value Date   HDL 47 03/22/2020   Lab Results  Component Value Date   LDLCALC 105 (H) 03/22/2020   Lab Results  Component Value Date   TRIG 105 03/22/2020   Lab Results  Component Value Date   CHOLHDL 3.6 03/22/2020   No results found for: HGBA1C

## 2020-09-21 LAB — CBC WITH DIFFERENTIAL/PLATELET
Basophils Absolute: 0 10*3/uL (ref 0.0–0.2)
Basos: 1 %
EOS (ABSOLUTE): 0.2 10*3/uL (ref 0.0–0.4)
Eos: 3 %
Hematocrit: 34.6 % (ref 34.0–46.6)
Hemoglobin: 11.6 g/dL (ref 11.1–15.9)
Immature Grans (Abs): 0 10*3/uL (ref 0.0–0.1)
Immature Granulocytes: 0 %
Lymphocytes Absolute: 1.6 10*3/uL (ref 0.7–3.1)
Lymphs: 32 %
MCH: 33 pg (ref 26.6–33.0)
MCHC: 33.5 g/dL (ref 31.5–35.7)
MCV: 99 fL — ABNORMAL HIGH (ref 79–97)
Monocytes Absolute: 0.6 10*3/uL (ref 0.1–0.9)
Monocytes: 11 %
Neutrophils Absolute: 2.8 10*3/uL (ref 1.4–7.0)
Neutrophils: 53 %
Platelets: 210 10*3/uL (ref 150–450)
RBC: 3.51 x10E6/uL — ABNORMAL LOW (ref 3.77–5.28)
RDW: 12.4 % (ref 11.7–15.4)
WBC: 5.2 10*3/uL (ref 3.4–10.8)

## 2020-09-21 LAB — CMP14+EGFR
ALT: 13 IU/L (ref 0–32)
AST: 16 IU/L (ref 0–40)
Albumin/Globulin Ratio: 2 (ref 1.2–2.2)
Albumin: 4.6 g/dL (ref 3.8–4.8)
Alkaline Phosphatase: 91 IU/L (ref 44–121)
BUN/Creatinine Ratio: 14 (ref 12–28)
BUN: 20 mg/dL (ref 8–27)
Bilirubin Total: <0.2 mg/dL (ref 0.0–1.2)
CO2: 21 mmol/L (ref 20–29)
Calcium: 9.8 mg/dL (ref 8.7–10.3)
Chloride: 100 mmol/L (ref 96–106)
Creatinine, Ser: 1.38 mg/dL — ABNORMAL HIGH (ref 0.57–1.00)
Globulin, Total: 2.3 g/dL (ref 1.5–4.5)
Glucose: 100 mg/dL — ABNORMAL HIGH (ref 65–99)
Potassium: 4.9 mmol/L (ref 3.5–5.2)
Sodium: 135 mmol/L (ref 134–144)
Total Protein: 6.9 g/dL (ref 6.0–8.5)
eGFR: 41 mL/min/{1.73_m2} — ABNORMAL LOW (ref >59)

## 2020-09-21 LAB — LIPID PANEL
Chol/HDL Ratio: 3 ratio (ref 0.0–4.4)
Cholesterol, Total: 163 mg/dL (ref 100–199)
HDL: 55 mg/dL (ref >39)
LDL Chol Calc (NIH): 95 mg/dL (ref 0–99)
Triglycerides: 68 mg/dL (ref 0–149)
VLDL Cholesterol Cal: 13 mg/dL (ref 5–40)

## 2020-09-21 LAB — LAMOTRIGINE LEVEL: Lamotrigine Lvl: 4.4 ug/mL (ref 2.0–20.0)

## 2020-09-21 LAB — TSH: TSH: 1.68 u[IU]/mL (ref 0.450–4.500)

## 2020-09-24 DIAGNOSIS — K449 Diaphragmatic hernia without obstruction or gangrene: Secondary | ICD-10-CM | POA: Diagnosis not present

## 2020-09-24 DIAGNOSIS — K2289 Other specified disease of esophagus: Secondary | ICD-10-CM | POA: Diagnosis not present

## 2020-09-24 DIAGNOSIS — K432 Incisional hernia without obstruction or gangrene: Secondary | ICD-10-CM | POA: Diagnosis not present

## 2020-09-24 DIAGNOSIS — K439 Ventral hernia without obstruction or gangrene: Secondary | ICD-10-CM | POA: Diagnosis not present

## 2020-09-24 NOTE — Progress Notes (Signed)
Booster has been documented

## 2020-09-26 ENCOUNTER — Ambulatory Visit: Payer: Medicare Other

## 2020-09-28 ENCOUNTER — Other Ambulatory Visit (INDEPENDENT_AMBULATORY_CARE_PROVIDER_SITE_OTHER): Payer: Medicare Other

## 2020-09-28 DIAGNOSIS — M85852 Other specified disorders of bone density and structure, left thigh: Secondary | ICD-10-CM | POA: Diagnosis not present

## 2020-09-28 DIAGNOSIS — Z780 Asymptomatic menopausal state: Secondary | ICD-10-CM | POA: Diagnosis not present

## 2020-09-29 DIAGNOSIS — M8580 Other specified disorders of bone density and structure, unspecified site: Secondary | ICD-10-CM

## 2020-09-29 HISTORY — DX: Other specified disorders of bone density and structure, unspecified site: M85.80

## 2020-10-01 ENCOUNTER — Encounter: Payer: Self-pay | Admitting: Family Medicine

## 2020-10-01 DIAGNOSIS — K432 Incisional hernia without obstruction or gangrene: Secondary | ICD-10-CM | POA: Diagnosis not present

## 2020-10-01 NOTE — Progress Notes (Signed)
NA 6/27-jhb - no VM

## 2020-10-03 ENCOUNTER — Telehealth: Payer: Self-pay | Admitting: *Deleted

## 2020-10-03 NOTE — Telephone Encounter (Signed)
Tina aware and verbalizes understanding.

## 2020-10-03 NOTE — Telephone Encounter (Signed)
Vm from [place] w[facility] Saw pt today. BP in left arm 86/50 & right was 88/46 She is weak and staggering some down the hall

## 2020-10-03 NOTE — Telephone Encounter (Signed)
Stop Losartan. Monitor BP readings at home, keep a log. Schedule follow-up with me in 2 weeks with BP readings. Can be telephone if they keep readings.

## 2020-10-20 ENCOUNTER — Other Ambulatory Visit: Payer: Self-pay | Admitting: Family Medicine

## 2020-10-20 DIAGNOSIS — J309 Allergic rhinitis, unspecified: Secondary | ICD-10-CM

## 2020-10-30 DIAGNOSIS — Z01812 Encounter for preprocedural laboratory examination: Secondary | ICD-10-CM | POA: Diagnosis not present

## 2020-10-30 DIAGNOSIS — Z01818 Encounter for other preprocedural examination: Secondary | ICD-10-CM | POA: Diagnosis not present

## 2020-10-30 DIAGNOSIS — K432 Incisional hernia without obstruction or gangrene: Secondary | ICD-10-CM | POA: Diagnosis not present

## 2020-10-30 DIAGNOSIS — Z79899 Other long term (current) drug therapy: Secondary | ICD-10-CM | POA: Diagnosis not present

## 2020-10-30 DIAGNOSIS — Z01810 Encounter for preprocedural cardiovascular examination: Secondary | ICD-10-CM | POA: Diagnosis not present

## 2020-11-09 DIAGNOSIS — G4730 Sleep apnea, unspecified: Secondary | ICD-10-CM | POA: Diagnosis not present

## 2020-11-09 DIAGNOSIS — K432 Incisional hernia without obstruction or gangrene: Secondary | ICD-10-CM | POA: Diagnosis not present

## 2020-11-09 DIAGNOSIS — K219 Gastro-esophageal reflux disease without esophagitis: Secondary | ICD-10-CM | POA: Diagnosis not present

## 2020-11-09 DIAGNOSIS — Z79899 Other long term (current) drug therapy: Secondary | ICD-10-CM | POA: Diagnosis not present

## 2020-11-09 DIAGNOSIS — L905 Scar conditions and fibrosis of skin: Secondary | ICD-10-CM | POA: Diagnosis not present

## 2020-11-09 DIAGNOSIS — R2689 Other abnormalities of gait and mobility: Secondary | ICD-10-CM | POA: Diagnosis not present

## 2020-11-09 DIAGNOSIS — I739 Peripheral vascular disease, unspecified: Secondary | ICD-10-CM | POA: Diagnosis not present

## 2020-11-09 DIAGNOSIS — I129 Hypertensive chronic kidney disease with stage 1 through stage 4 chronic kidney disease, or unspecified chronic kidney disease: Secondary | ICD-10-CM | POA: Diagnosis not present

## 2020-11-09 DIAGNOSIS — E039 Hypothyroidism, unspecified: Secondary | ICD-10-CM | POA: Diagnosis not present

## 2020-11-09 DIAGNOSIS — K430 Incisional hernia with obstruction, without gangrene: Secondary | ICD-10-CM | POA: Diagnosis not present

## 2020-11-09 DIAGNOSIS — N183 Chronic kidney disease, stage 3 unspecified: Secondary | ICD-10-CM | POA: Diagnosis not present

## 2020-11-09 DIAGNOSIS — I2510 Atherosclerotic heart disease of native coronary artery without angina pectoris: Secondary | ICD-10-CM | POA: Diagnosis not present

## 2020-11-09 DIAGNOSIS — F32A Depression, unspecified: Secondary | ICD-10-CM | POA: Diagnosis not present

## 2020-11-09 DIAGNOSIS — R569 Unspecified convulsions: Secondary | ICD-10-CM | POA: Diagnosis not present

## 2020-11-09 HISTORY — PX: VENTRAL HERNIA REPAIR: SHX424

## 2020-11-10 DIAGNOSIS — L905 Scar conditions and fibrosis of skin: Secondary | ICD-10-CM | POA: Diagnosis not present

## 2020-11-10 DIAGNOSIS — E039 Hypothyroidism, unspecified: Secondary | ICD-10-CM | POA: Diagnosis not present

## 2020-11-10 DIAGNOSIS — I129 Hypertensive chronic kidney disease with stage 1 through stage 4 chronic kidney disease, or unspecified chronic kidney disease: Secondary | ICD-10-CM | POA: Diagnosis not present

## 2020-11-10 DIAGNOSIS — R569 Unspecified convulsions: Secondary | ICD-10-CM | POA: Diagnosis not present

## 2020-11-10 DIAGNOSIS — I739 Peripheral vascular disease, unspecified: Secondary | ICD-10-CM | POA: Diagnosis not present

## 2020-11-10 DIAGNOSIS — K430 Incisional hernia with obstruction, without gangrene: Secondary | ICD-10-CM | POA: Diagnosis not present

## 2020-11-10 DIAGNOSIS — K219 Gastro-esophageal reflux disease without esophagitis: Secondary | ICD-10-CM | POA: Diagnosis not present

## 2020-11-10 DIAGNOSIS — F32A Depression, unspecified: Secondary | ICD-10-CM | POA: Diagnosis not present

## 2020-11-10 DIAGNOSIS — I2510 Atherosclerotic heart disease of native coronary artery without angina pectoris: Secondary | ICD-10-CM | POA: Diagnosis not present

## 2020-11-10 DIAGNOSIS — R2689 Other abnormalities of gait and mobility: Secondary | ICD-10-CM | POA: Diagnosis not present

## 2020-11-10 DIAGNOSIS — N183 Chronic kidney disease, stage 3 unspecified: Secondary | ICD-10-CM | POA: Diagnosis not present

## 2020-11-10 DIAGNOSIS — Z79899 Other long term (current) drug therapy: Secondary | ICD-10-CM | POA: Diagnosis not present

## 2020-11-11 DIAGNOSIS — R569 Unspecified convulsions: Secondary | ICD-10-CM | POA: Diagnosis not present

## 2020-11-11 DIAGNOSIS — I129 Hypertensive chronic kidney disease with stage 1 through stage 4 chronic kidney disease, or unspecified chronic kidney disease: Secondary | ICD-10-CM | POA: Diagnosis not present

## 2020-11-11 DIAGNOSIS — L905 Scar conditions and fibrosis of skin: Secondary | ICD-10-CM | POA: Diagnosis not present

## 2020-11-11 DIAGNOSIS — F32A Depression, unspecified: Secondary | ICD-10-CM | POA: Diagnosis not present

## 2020-11-11 DIAGNOSIS — E039 Hypothyroidism, unspecified: Secondary | ICD-10-CM | POA: Diagnosis not present

## 2020-11-11 DIAGNOSIS — I739 Peripheral vascular disease, unspecified: Secondary | ICD-10-CM | POA: Diagnosis not present

## 2020-11-11 DIAGNOSIS — K430 Incisional hernia with obstruction, without gangrene: Secondary | ICD-10-CM | POA: Diagnosis not present

## 2020-11-11 DIAGNOSIS — I2510 Atherosclerotic heart disease of native coronary artery without angina pectoris: Secondary | ICD-10-CM | POA: Diagnosis not present

## 2020-11-11 DIAGNOSIS — R2689 Other abnormalities of gait and mobility: Secondary | ICD-10-CM | POA: Diagnosis not present

## 2020-11-11 DIAGNOSIS — N183 Chronic kidney disease, stage 3 unspecified: Secondary | ICD-10-CM | POA: Diagnosis not present

## 2020-11-11 DIAGNOSIS — Z79899 Other long term (current) drug therapy: Secondary | ICD-10-CM | POA: Diagnosis not present

## 2020-11-11 DIAGNOSIS — K219 Gastro-esophageal reflux disease without esophagitis: Secondary | ICD-10-CM | POA: Diagnosis not present

## 2020-11-12 DIAGNOSIS — K219 Gastro-esophageal reflux disease without esophagitis: Secondary | ICD-10-CM | POA: Diagnosis not present

## 2020-11-12 DIAGNOSIS — K430 Incisional hernia with obstruction, without gangrene: Secondary | ICD-10-CM | POA: Diagnosis not present

## 2020-11-12 DIAGNOSIS — I2510 Atherosclerotic heart disease of native coronary artery without angina pectoris: Secondary | ICD-10-CM | POA: Diagnosis not present

## 2020-11-12 DIAGNOSIS — N183 Chronic kidney disease, stage 3 unspecified: Secondary | ICD-10-CM | POA: Diagnosis not present

## 2020-11-12 DIAGNOSIS — R569 Unspecified convulsions: Secondary | ICD-10-CM | POA: Diagnosis not present

## 2020-11-12 DIAGNOSIS — R2689 Other abnormalities of gait and mobility: Secondary | ICD-10-CM | POA: Diagnosis not present

## 2020-11-12 DIAGNOSIS — F32A Depression, unspecified: Secondary | ICD-10-CM | POA: Diagnosis not present

## 2020-11-12 DIAGNOSIS — I129 Hypertensive chronic kidney disease with stage 1 through stage 4 chronic kidney disease, or unspecified chronic kidney disease: Secondary | ICD-10-CM | POA: Diagnosis not present

## 2020-11-12 DIAGNOSIS — I739 Peripheral vascular disease, unspecified: Secondary | ICD-10-CM | POA: Diagnosis not present

## 2020-11-12 DIAGNOSIS — Z79899 Other long term (current) drug therapy: Secondary | ICD-10-CM | POA: Diagnosis not present

## 2020-11-12 DIAGNOSIS — E039 Hypothyroidism, unspecified: Secondary | ICD-10-CM | POA: Diagnosis not present

## 2020-11-12 DIAGNOSIS — L905 Scar conditions and fibrosis of skin: Secondary | ICD-10-CM | POA: Diagnosis not present

## 2020-11-13 DIAGNOSIS — I2510 Atherosclerotic heart disease of native coronary artery without angina pectoris: Secondary | ICD-10-CM | POA: Diagnosis not present

## 2020-11-13 DIAGNOSIS — K219 Gastro-esophageal reflux disease without esophagitis: Secondary | ICD-10-CM | POA: Diagnosis not present

## 2020-11-13 DIAGNOSIS — R569 Unspecified convulsions: Secondary | ICD-10-CM | POA: Diagnosis not present

## 2020-11-13 DIAGNOSIS — F32A Depression, unspecified: Secondary | ICD-10-CM | POA: Diagnosis not present

## 2020-11-13 DIAGNOSIS — K430 Incisional hernia with obstruction, without gangrene: Secondary | ICD-10-CM | POA: Diagnosis not present

## 2020-11-13 DIAGNOSIS — E039 Hypothyroidism, unspecified: Secondary | ICD-10-CM | POA: Diagnosis not present

## 2020-11-13 DIAGNOSIS — I739 Peripheral vascular disease, unspecified: Secondary | ICD-10-CM | POA: Diagnosis not present

## 2020-11-13 DIAGNOSIS — R2689 Other abnormalities of gait and mobility: Secondary | ICD-10-CM | POA: Diagnosis not present

## 2020-11-13 DIAGNOSIS — I129 Hypertensive chronic kidney disease with stage 1 through stage 4 chronic kidney disease, or unspecified chronic kidney disease: Secondary | ICD-10-CM | POA: Diagnosis not present

## 2020-11-13 DIAGNOSIS — Z79899 Other long term (current) drug therapy: Secondary | ICD-10-CM | POA: Diagnosis not present

## 2020-11-13 DIAGNOSIS — L905 Scar conditions and fibrosis of skin: Secondary | ICD-10-CM | POA: Diagnosis not present

## 2020-11-13 DIAGNOSIS — N183 Chronic kidney disease, stage 3 unspecified: Secondary | ICD-10-CM | POA: Diagnosis not present

## 2020-11-14 DIAGNOSIS — Z79899 Other long term (current) drug therapy: Secondary | ICD-10-CM | POA: Diagnosis not present

## 2020-11-14 DIAGNOSIS — R569 Unspecified convulsions: Secondary | ICD-10-CM | POA: Diagnosis not present

## 2020-11-14 DIAGNOSIS — L905 Scar conditions and fibrosis of skin: Secondary | ICD-10-CM | POA: Diagnosis not present

## 2020-11-14 DIAGNOSIS — K430 Incisional hernia with obstruction, without gangrene: Secondary | ICD-10-CM | POA: Diagnosis not present

## 2020-11-14 DIAGNOSIS — K219 Gastro-esophageal reflux disease without esophagitis: Secondary | ICD-10-CM | POA: Diagnosis not present

## 2020-11-14 DIAGNOSIS — E039 Hypothyroidism, unspecified: Secondary | ICD-10-CM | POA: Diagnosis not present

## 2020-11-14 DIAGNOSIS — N183 Chronic kidney disease, stage 3 unspecified: Secondary | ICD-10-CM | POA: Diagnosis not present

## 2020-11-14 DIAGNOSIS — I2510 Atherosclerotic heart disease of native coronary artery without angina pectoris: Secondary | ICD-10-CM | POA: Diagnosis not present

## 2020-11-14 DIAGNOSIS — I129 Hypertensive chronic kidney disease with stage 1 through stage 4 chronic kidney disease, or unspecified chronic kidney disease: Secondary | ICD-10-CM | POA: Diagnosis not present

## 2020-11-14 DIAGNOSIS — I739 Peripheral vascular disease, unspecified: Secondary | ICD-10-CM | POA: Diagnosis not present

## 2020-11-14 DIAGNOSIS — F32A Depression, unspecified: Secondary | ICD-10-CM | POA: Diagnosis not present

## 2020-11-14 DIAGNOSIS — R2689 Other abnormalities of gait and mobility: Secondary | ICD-10-CM | POA: Diagnosis not present

## 2020-11-15 DIAGNOSIS — K430 Incisional hernia with obstruction, without gangrene: Secondary | ICD-10-CM | POA: Diagnosis not present

## 2020-11-15 DIAGNOSIS — I2510 Atherosclerotic heart disease of native coronary artery without angina pectoris: Secondary | ICD-10-CM | POA: Diagnosis not present

## 2020-11-15 DIAGNOSIS — K5900 Constipation, unspecified: Secondary | ICD-10-CM | POA: Diagnosis not present

## 2020-11-15 DIAGNOSIS — R2689 Other abnormalities of gait and mobility: Secondary | ICD-10-CM | POA: Diagnosis not present

## 2020-11-15 DIAGNOSIS — E119 Type 2 diabetes mellitus without complications: Secondary | ICD-10-CM | POA: Diagnosis not present

## 2020-11-15 DIAGNOSIS — R278 Other lack of coordination: Secondary | ICD-10-CM | POA: Diagnosis not present

## 2020-11-15 DIAGNOSIS — K432 Incisional hernia without obstruction or gangrene: Secondary | ICD-10-CM | POA: Diagnosis not present

## 2020-11-15 DIAGNOSIS — E785 Hyperlipidemia, unspecified: Secondary | ICD-10-CM | POA: Diagnosis not present

## 2020-11-15 DIAGNOSIS — I739 Peripheral vascular disease, unspecified: Secondary | ICD-10-CM | POA: Diagnosis not present

## 2020-11-15 DIAGNOSIS — I119 Hypertensive heart disease without heart failure: Secondary | ICD-10-CM | POA: Diagnosis not present

## 2020-11-15 DIAGNOSIS — N319 Neuromuscular dysfunction of bladder, unspecified: Secondary | ICD-10-CM | POA: Diagnosis not present

## 2020-11-15 DIAGNOSIS — G4709 Other insomnia: Secondary | ICD-10-CM | POA: Diagnosis not present

## 2020-11-15 DIAGNOSIS — Z960 Presence of urogenital implants: Secondary | ICD-10-CM | POA: Diagnosis not present

## 2020-11-15 DIAGNOSIS — R569 Unspecified convulsions: Secondary | ICD-10-CM | POA: Diagnosis not present

## 2020-11-15 DIAGNOSIS — I129 Hypertensive chronic kidney disease with stage 1 through stage 4 chronic kidney disease, or unspecified chronic kidney disease: Secondary | ICD-10-CM | POA: Diagnosis not present

## 2020-11-15 DIAGNOSIS — F32A Depression, unspecified: Secondary | ICD-10-CM | POA: Diagnosis not present

## 2020-11-15 DIAGNOSIS — G4700 Insomnia, unspecified: Secondary | ICD-10-CM | POA: Diagnosis not present

## 2020-11-15 DIAGNOSIS — N139 Obstructive and reflux uropathy, unspecified: Secondary | ICD-10-CM | POA: Diagnosis not present

## 2020-11-15 DIAGNOSIS — N183 Chronic kidney disease, stage 3 unspecified: Secondary | ICD-10-CM | POA: Diagnosis not present

## 2020-11-15 DIAGNOSIS — D539 Nutritional anemia, unspecified: Secondary | ICD-10-CM | POA: Diagnosis not present

## 2020-11-15 DIAGNOSIS — E039 Hypothyroidism, unspecified: Secondary | ICD-10-CM | POA: Diagnosis not present

## 2020-11-15 DIAGNOSIS — E559 Vitamin D deficiency, unspecified: Secondary | ICD-10-CM | POA: Diagnosis not present

## 2020-11-15 DIAGNOSIS — M1990 Unspecified osteoarthritis, unspecified site: Secondary | ICD-10-CM | POA: Diagnosis not present

## 2020-11-15 DIAGNOSIS — N189 Chronic kidney disease, unspecified: Secondary | ICD-10-CM | POA: Diagnosis not present

## 2020-11-15 DIAGNOSIS — J309 Allergic rhinitis, unspecified: Secondary | ICD-10-CM | POA: Diagnosis not present

## 2020-11-15 DIAGNOSIS — L905 Scar conditions and fibrosis of skin: Secondary | ICD-10-CM | POA: Diagnosis not present

## 2020-11-15 DIAGNOSIS — D631 Anemia in chronic kidney disease: Secondary | ICD-10-CM | POA: Diagnosis not present

## 2020-11-15 DIAGNOSIS — I10 Essential (primary) hypertension: Secondary | ICD-10-CM | POA: Diagnosis not present

## 2020-11-15 DIAGNOSIS — E782 Mixed hyperlipidemia: Secondary | ICD-10-CM | POA: Diagnosis not present

## 2020-11-15 DIAGNOSIS — Z79899 Other long term (current) drug therapy: Secondary | ICD-10-CM | POA: Diagnosis not present

## 2020-11-15 DIAGNOSIS — Q048 Other specified congenital malformations of brain: Secondary | ICD-10-CM | POA: Diagnosis not present

## 2020-11-15 DIAGNOSIS — K219 Gastro-esophageal reflux disease without esophagitis: Secondary | ICD-10-CM | POA: Diagnosis not present

## 2020-11-15 DIAGNOSIS — M6281 Muscle weakness (generalized): Secondary | ICD-10-CM | POA: Diagnosis not present

## 2020-11-19 DIAGNOSIS — M6281 Muscle weakness (generalized): Secondary | ICD-10-CM | POA: Diagnosis not present

## 2020-11-19 DIAGNOSIS — N139 Obstructive and reflux uropathy, unspecified: Secondary | ICD-10-CM | POA: Diagnosis not present

## 2020-11-19 DIAGNOSIS — K432 Incisional hernia without obstruction or gangrene: Secondary | ICD-10-CM | POA: Diagnosis not present

## 2020-11-19 DIAGNOSIS — R569 Unspecified convulsions: Secondary | ICD-10-CM | POA: Diagnosis not present

## 2020-11-19 DIAGNOSIS — E559 Vitamin D deficiency, unspecified: Secondary | ICD-10-CM | POA: Diagnosis not present

## 2020-11-19 DIAGNOSIS — G4700 Insomnia, unspecified: Secondary | ICD-10-CM | POA: Diagnosis not present

## 2020-11-19 DIAGNOSIS — I739 Peripheral vascular disease, unspecified: Secondary | ICD-10-CM | POA: Diagnosis not present

## 2020-11-19 DIAGNOSIS — I2510 Atherosclerotic heart disease of native coronary artery without angina pectoris: Secondary | ICD-10-CM | POA: Diagnosis not present

## 2020-11-19 DIAGNOSIS — I119 Hypertensive heart disease without heart failure: Secondary | ICD-10-CM | POA: Diagnosis not present

## 2020-11-20 ENCOUNTER — Encounter: Payer: Self-pay | Admitting: Gastroenterology

## 2020-11-20 ENCOUNTER — Other Ambulatory Visit: Payer: Self-pay | Admitting: Family Medicine

## 2020-11-20 ENCOUNTER — Other Ambulatory Visit: Payer: Self-pay | Admitting: Nurse Practitioner

## 2020-11-20 DIAGNOSIS — G40401 Other generalized epilepsy and epileptic syndromes, not intractable, with status epilepticus: Secondary | ICD-10-CM

## 2020-11-20 DIAGNOSIS — K5900 Constipation, unspecified: Secondary | ICD-10-CM

## 2020-11-20 DIAGNOSIS — K219 Gastro-esophageal reflux disease without esophagitis: Secondary | ICD-10-CM

## 2020-11-20 DIAGNOSIS — L299 Pruritus, unspecified: Secondary | ICD-10-CM

## 2020-11-20 NOTE — Telephone Encounter (Signed)
Refilled but needs office visit.  °

## 2020-11-21 DIAGNOSIS — K432 Incisional hernia without obstruction or gangrene: Secondary | ICD-10-CM | POA: Diagnosis not present

## 2020-11-21 DIAGNOSIS — M6281 Muscle weakness (generalized): Secondary | ICD-10-CM | POA: Diagnosis not present

## 2020-11-21 DIAGNOSIS — N139 Obstructive and reflux uropathy, unspecified: Secondary | ICD-10-CM | POA: Diagnosis not present

## 2020-11-21 DIAGNOSIS — I119 Hypertensive heart disease without heart failure: Secondary | ICD-10-CM | POA: Diagnosis not present

## 2020-11-21 DIAGNOSIS — I739 Peripheral vascular disease, unspecified: Secondary | ICD-10-CM | POA: Diagnosis not present

## 2020-11-21 DIAGNOSIS — I2510 Atherosclerotic heart disease of native coronary artery without angina pectoris: Secondary | ICD-10-CM | POA: Diagnosis not present

## 2020-11-21 DIAGNOSIS — E559 Vitamin D deficiency, unspecified: Secondary | ICD-10-CM | POA: Diagnosis not present

## 2020-11-21 DIAGNOSIS — G4700 Insomnia, unspecified: Secondary | ICD-10-CM | POA: Diagnosis not present

## 2020-11-21 DIAGNOSIS — R569 Unspecified convulsions: Secondary | ICD-10-CM | POA: Diagnosis not present

## 2020-11-23 DIAGNOSIS — I2510 Atherosclerotic heart disease of native coronary artery without angina pectoris: Secondary | ICD-10-CM | POA: Diagnosis not present

## 2020-11-23 DIAGNOSIS — M6281 Muscle weakness (generalized): Secondary | ICD-10-CM | POA: Diagnosis not present

## 2020-11-23 DIAGNOSIS — I739 Peripheral vascular disease, unspecified: Secondary | ICD-10-CM | POA: Diagnosis not present

## 2020-11-23 DIAGNOSIS — E559 Vitamin D deficiency, unspecified: Secondary | ICD-10-CM | POA: Diagnosis not present

## 2020-11-23 DIAGNOSIS — N139 Obstructive and reflux uropathy, unspecified: Secondary | ICD-10-CM | POA: Diagnosis not present

## 2020-11-23 DIAGNOSIS — K432 Incisional hernia without obstruction or gangrene: Secondary | ICD-10-CM | POA: Diagnosis not present

## 2020-11-23 DIAGNOSIS — E782 Mixed hyperlipidemia: Secondary | ICD-10-CM | POA: Diagnosis not present

## 2020-11-23 DIAGNOSIS — I119 Hypertensive heart disease without heart failure: Secondary | ICD-10-CM | POA: Diagnosis not present

## 2020-11-23 DIAGNOSIS — K219 Gastro-esophageal reflux disease without esophagitis: Secondary | ICD-10-CM | POA: Diagnosis not present

## 2020-11-23 DIAGNOSIS — G4709 Other insomnia: Secondary | ICD-10-CM | POA: Diagnosis not present

## 2020-11-23 DIAGNOSIS — I10 Essential (primary) hypertension: Secondary | ICD-10-CM | POA: Diagnosis not present

## 2020-11-26 DIAGNOSIS — R569 Unspecified convulsions: Secondary | ICD-10-CM | POA: Diagnosis not present

## 2020-11-26 DIAGNOSIS — I2510 Atherosclerotic heart disease of native coronary artery without angina pectoris: Secondary | ICD-10-CM | POA: Diagnosis not present

## 2020-11-26 DIAGNOSIS — I119 Hypertensive heart disease without heart failure: Secondary | ICD-10-CM | POA: Diagnosis not present

## 2020-11-26 DIAGNOSIS — K432 Incisional hernia without obstruction or gangrene: Secondary | ICD-10-CM | POA: Diagnosis not present

## 2020-11-26 DIAGNOSIS — I739 Peripheral vascular disease, unspecified: Secondary | ICD-10-CM | POA: Diagnosis not present

## 2020-11-26 DIAGNOSIS — K219 Gastro-esophageal reflux disease without esophagitis: Secondary | ICD-10-CM | POA: Diagnosis not present

## 2020-11-26 DIAGNOSIS — K5900 Constipation, unspecified: Secondary | ICD-10-CM | POA: Diagnosis not present

## 2020-11-26 DIAGNOSIS — M6281 Muscle weakness (generalized): Secondary | ICD-10-CM | POA: Diagnosis not present

## 2020-11-26 DIAGNOSIS — N139 Obstructive and reflux uropathy, unspecified: Secondary | ICD-10-CM | POA: Diagnosis not present

## 2020-11-28 ENCOUNTER — Other Ambulatory Visit: Payer: Self-pay

## 2020-11-28 ENCOUNTER — Telehealth: Payer: Self-pay | Admitting: Family Medicine

## 2020-11-28 ENCOUNTER — Ambulatory Visit (INDEPENDENT_AMBULATORY_CARE_PROVIDER_SITE_OTHER): Payer: Medicare Other

## 2020-11-28 DIAGNOSIS — N139 Obstructive and reflux uropathy, unspecified: Secondary | ICD-10-CM | POA: Diagnosis not present

## 2020-11-28 DIAGNOSIS — D539 Nutritional anemia, unspecified: Secondary | ICD-10-CM | POA: Diagnosis not present

## 2020-11-28 DIAGNOSIS — I119 Hypertensive heart disease without heart failure: Secondary | ICD-10-CM | POA: Diagnosis not present

## 2020-11-28 DIAGNOSIS — I739 Peripheral vascular disease, unspecified: Secondary | ICD-10-CM | POA: Diagnosis not present

## 2020-11-28 DIAGNOSIS — K5900 Constipation, unspecified: Secondary | ICD-10-CM | POA: Diagnosis not present

## 2020-11-28 DIAGNOSIS — M1990 Unspecified osteoarthritis, unspecified site: Secondary | ICD-10-CM

## 2020-11-28 DIAGNOSIS — E669 Obesity, unspecified: Secondary | ICD-10-CM

## 2020-11-28 DIAGNOSIS — N183 Chronic kidney disease, stage 3 unspecified: Secondary | ICD-10-CM

## 2020-11-28 DIAGNOSIS — R569 Unspecified convulsions: Secondary | ICD-10-CM | POA: Diagnosis not present

## 2020-11-28 DIAGNOSIS — I129 Hypertensive chronic kidney disease with stage 1 through stage 4 chronic kidney disease, or unspecified chronic kidney disease: Secondary | ICD-10-CM

## 2020-11-28 DIAGNOSIS — F259 Schizoaffective disorder, unspecified: Secondary | ICD-10-CM

## 2020-11-28 DIAGNOSIS — M6281 Muscle weakness (generalized): Secondary | ICD-10-CM | POA: Diagnosis not present

## 2020-11-28 DIAGNOSIS — K219 Gastro-esophageal reflux disease without esophagitis: Secondary | ICD-10-CM

## 2020-11-28 DIAGNOSIS — D631 Anemia in chronic kidney disease: Secondary | ICD-10-CM

## 2020-11-28 DIAGNOSIS — K432 Incisional hernia without obstruction or gangrene: Secondary | ICD-10-CM | POA: Diagnosis not present

## 2020-11-28 DIAGNOSIS — Z9181 History of falling: Secondary | ICD-10-CM

## 2020-11-28 DIAGNOSIS — Q048 Other specified congenital malformations of brain: Secondary | ICD-10-CM | POA: Diagnosis not present

## 2020-11-28 DIAGNOSIS — G40909 Epilepsy, unspecified, not intractable, without status epilepticus: Secondary | ICD-10-CM

## 2020-11-28 DIAGNOSIS — E785 Hyperlipidemia, unspecified: Secondary | ICD-10-CM

## 2020-11-28 DIAGNOSIS — I2510 Atherosclerotic heart disease of native coronary artery without angina pectoris: Secondary | ICD-10-CM | POA: Diagnosis not present

## 2020-11-28 DIAGNOSIS — E039 Hypothyroidism, unspecified: Secondary | ICD-10-CM

## 2020-11-28 DIAGNOSIS — Z79899 Other long term (current) drug therapy: Secondary | ICD-10-CM

## 2020-11-28 NOTE — Telephone Encounter (Signed)
Appointment scheduled with Pam Sanders on 12/05/20 at 10:05 am

## 2020-11-30 ENCOUNTER — Telehealth: Payer: Self-pay

## 2020-11-30 NOTE — Telephone Encounter (Signed)
Transition Care Management Unsuccessful Follow-up Telephone Call  Date of discharge and from where:  [facility] 11/29/20  Diagnosis:  rehab post hernia surgery    Attempts:  1st Attempt  Reason for unsuccessful TCM follow-up call:  No answer/busy no voice mail

## 2020-12-05 ENCOUNTER — Encounter: Payer: Self-pay | Admitting: Family Medicine

## 2020-12-05 ENCOUNTER — Other Ambulatory Visit: Payer: Self-pay

## 2020-12-05 ENCOUNTER — Ambulatory Visit (INDEPENDENT_AMBULATORY_CARE_PROVIDER_SITE_OTHER): Payer: Medicare Other | Admitting: Family Medicine

## 2020-12-05 VITALS — BP 102/62 | HR 58 | Temp 97.9°F | Ht 61.0 in | Wt 175.8 lb

## 2020-12-05 DIAGNOSIS — I129 Hypertensive chronic kidney disease with stage 1 through stage 4 chronic kidney disease, or unspecified chronic kidney disease: Secondary | ICD-10-CM | POA: Diagnosis not present

## 2020-12-05 DIAGNOSIS — Z23 Encounter for immunization: Secondary | ICD-10-CM | POA: Diagnosis not present

## 2020-12-05 DIAGNOSIS — M8580 Other specified disorders of bone density and structure, unspecified site: Secondary | ICD-10-CM | POA: Diagnosis not present

## 2020-12-05 DIAGNOSIS — I10 Essential (primary) hypertension: Secondary | ICD-10-CM | POA: Diagnosis not present

## 2020-12-05 DIAGNOSIS — I739 Peripheral vascular disease, unspecified: Secondary | ICD-10-CM | POA: Diagnosis not present

## 2020-12-05 DIAGNOSIS — K5900 Constipation, unspecified: Secondary | ICD-10-CM | POA: Diagnosis not present

## 2020-12-05 DIAGNOSIS — Z9889 Other specified postprocedural states: Secondary | ICD-10-CM

## 2020-12-05 DIAGNOSIS — M1990 Unspecified osteoarthritis, unspecified site: Secondary | ICD-10-CM | POA: Diagnosis not present

## 2020-12-05 DIAGNOSIS — I2510 Atherosclerotic heart disease of native coronary artery without angina pectoris: Secondary | ICD-10-CM | POA: Diagnosis not present

## 2020-12-05 DIAGNOSIS — R251 Tremor, unspecified: Secondary | ICD-10-CM | POA: Diagnosis not present

## 2020-12-05 DIAGNOSIS — N3001 Acute cystitis with hematuria: Secondary | ICD-10-CM | POA: Diagnosis not present

## 2020-12-05 DIAGNOSIS — Z48815 Encounter for surgical aftercare following surgery on the digestive system: Secondary | ICD-10-CM | POA: Diagnosis not present

## 2020-12-05 DIAGNOSIS — D631 Anemia in chronic kidney disease: Secondary | ICD-10-CM | POA: Diagnosis not present

## 2020-12-05 DIAGNOSIS — R300 Dysuria: Secondary | ICD-10-CM

## 2020-12-05 DIAGNOSIS — Z8719 Personal history of other diseases of the digestive system: Secondary | ICD-10-CM | POA: Diagnosis not present

## 2020-12-05 DIAGNOSIS — N184 Chronic kidney disease, stage 4 (severe): Secondary | ICD-10-CM | POA: Diagnosis not present

## 2020-12-05 DIAGNOSIS — F32A Depression, unspecified: Secondary | ICD-10-CM | POA: Diagnosis not present

## 2020-12-05 DIAGNOSIS — K219 Gastro-esophageal reflux disease without esophagitis: Secondary | ICD-10-CM | POA: Diagnosis not present

## 2020-12-05 DIAGNOSIS — E039 Hypothyroidism, unspecified: Secondary | ICD-10-CM | POA: Diagnosis not present

## 2020-12-05 LAB — URINALYSIS, ROUTINE W REFLEX MICROSCOPIC
Bilirubin, UA: NEGATIVE
Glucose, UA: NEGATIVE
Ketones, UA: NEGATIVE
Nitrite, UA: POSITIVE — AB
Protein,UA: NEGATIVE
Specific Gravity, UA: <1.005 — ABNORMAL LOW (ref 1.005–1.030)
Urobilinogen, Ur: 0.2 mg/dL (ref 0.2–1.0)
pH, UA: 5.5 (ref 5.0–7.5)

## 2020-12-05 LAB — MICROSCOPIC EXAMINATION: RBC, Urine: NONE SEEN /hpf (ref 0–2)

## 2020-12-05 MED ORDER — CEFDINIR 300 MG PO CAPS: 300.0000 mg | ORAL_CAPSULE | Freq: Two times a day (BID) | ORAL | 0 refills | Status: AC

## 2020-12-05 MED ORDER — LOSARTAN POTASSIUM 25 MG PO TABS: 25.0000 mg | ORAL_TABLET | Freq: Every day | ORAL | 2 refills | Status: DC

## 2020-12-05 NOTE — Patient Instructions (Signed)
Please schedule your mammogram.  Reminder: please return after mid-September for your flu shot. If you receive this elsewhere, such as your pharmacy, please let us know so we can get this documented in your chart.

## 2020-12-05 NOTE — Progress Notes (Signed)
Assessment & Plan:  1. S/P repair of ventral hernia - midline incision healing well, no signs of infection - encouraged to wear abdominal binder per surgery's order to prevent dehiscence.  - BMP8+EGFR - CBC with Differential/Platelet  2. Essential hypertension - losartan dose was decreased while in rehab to 96m from 530m will continue 2571m encouraged to take BP at home and keep log - losartan (COZAAR) 25 MG tablet; Take 1 tablet (25 mg total) by mouth daily.  Dispense: 30 tablet; Refill: 2  3. Gastroesophageal reflux disease without esophagitis - continue scheduled omeprazole and famotidine, and prn maalox  4. Constipation, unspecified constipation type - daily episodes of diarrhea - Advised to reduce Linzess dose to every other day to see if symptoms improve.   5. Dysuria - could be related to UTI or mechanical from foley catheter placement - Urinalysis, Routine w reflex microscopic, Urine dipstick shows positive for WBC's, positive for RBC's, positive for nitrates, and positive for leukocytes.  Micro exam: 6 WBC's per HPF and many bacteria. - urine culture - cefdinir 300 mg capsule, BID x7 days  6. Immunization due - Shingrix given in office today.   Return in about 6 weeks (around 01/16/2021) for HTN.  AshLucile CraterP Student  I personally was present during the history, physical exam, and medical decision-making activities of this service and have verified that the service and findings are accurately documented in the nurse practitioner student's note.  BriHendricks LimesSN, APRN, FNP-C [facility]   Subjective:    Patient ID: Pam CUOCOemale    DOB: [id number] 64o.   MRN: [id number] Care Team: JoyLoman BrooklynNP as PCP - General (Family Medicine) FieDanie BinderD (Inactive) as Consulting Physician (Gastroenterology) YanMarcial PacasD as Consulting Physician (Neurology)   Chief Complaint:  Chief Complaint   Patient presents with   [place] after her[place] surgery     HPI: Pam Sanders presenting on 12/05/2020 for [place] after hernia surgery )  She is accompanied by her sister who she is agreeable to being present.   [facility] incisional hernia repair. She was discharged with Norco for 7 days. She was supposed to follow up in 1 week with Dr. RicKathreen Cosierth Surgery. She had her post-op follow up on 11/28/20. Staples were removed and steri strips were placed, ordered to continue with no heavy lifting. She has an abdominal binder that she has not been wearing.  Failed voiding trial and foley catheter was placed in the hospital, and a follow up was made with Urology outpatient. She saw urology on 11/26/20 and they removed the foley.   [facility] While she was there, she received PT/OT and nursing services. She was discharged with home health with a CNA coming to the house 5 days a week from 8a-12pm. The CNA has been taking her BP daily, her sister states she will bring the log for evaluation of hypotension. She has been having diarrhea daily and has been taking her Linzess daily.   New complaints: She would like assistance with determining which medications she should be taking. Her sister is concerned about a potential urinary tract infection since the foley was removed. The patient denies fever or urinary frequency. She states she has burning when  she voids.  Social history:  Relevant past medical, surgical, family and social history reviewed and updated as indicated. Interim medical history since our last visit reviewed.  Allergies and medications reviewed and updated.  DATA REVIEWED: CHART IN EPIC  ROS: Negative unless specifically indicated above in HPI.    Current Outpatient Medications:    alum & mag  hydroxide-simeth (MAALOX/MYLANTA) 200-200-20 MG/5ML suspension, Take 30 mLs by mouth every 6 (six) hours as needed for indigestion or heartburn., Disp: , Rfl:    atorvastatin (LIPITOR) 10 MG tablet, Take 1 tablet (10 mg total) by mouth daily., Disp: 90 tablet, Rfl: 1   Cholecalciferol (VITAMIN D-3) 1000 UNITS CAPS, Take 1,000 Units by mouth daily. , Disp: , Rfl:    Clotrimazole 1 % OINT, Apply 1 application topically 2 (two) times daily., Disp: 56.7 g, Rfl: 0   famotidine (PEPCID) 20 MG tablet, Take 1 tablet (20 mg total) by mouth at bedtime., Disp: 90 tablet, Rfl: 1   fluticasone (FLONASE) 50 MCG/ACT nasal spray, USE 2 SPRAYS INTO EACH NOSTRIL ONCE DAILY AT BEDTIME, Disp: 16 g, Rfl: 5   furosemide (LASIX) 20 MG tablet, Take 1 tablet (20 mg total) by mouth daily., Disp: 90 tablet, Rfl: 1   lamoTRIgine (LAMICTAL) 100 MG tablet, TAKE  (1)  TABLET TWICE A DAY., Disp: 180 tablet, Rfl: 1   levothyroxine (SYNTHROID) 137 MCG tablet, TAKE 1 TABLET DAILY BEFORE BREAKFAST, Disp: 90 tablet, Rfl: 2   LINZESS 72 MCG capsule, TAKE 1 CAPSULE DAILY BEFORE BREAKFAST, Disp: 30 capsule, Rfl: 11   loratadine (CLARITIN) 10 MG tablet, Take 1 tablet (10 mg total) by mouth daily., Disp: 90 tablet, Rfl: 1   Melatonin 5 MG CAPS, Take 5 mg by mouth at bedtime., Disp: , Rfl:    Multiple Vitamin (MULTIVITAMIN WITH MINERALS) TABS tablet, Take 1 tablet by mouth daily., Disp: , Rfl:    Olopatadine HCl 0.2 % SOLN, Apply 1 drop to eye daily., Disp: 2.5 mL, Rfl: 0   Omega-3 Fatty Acids (FISH OIL) 1000 MG CAPS, Take 1 capsule by mouth daily. , Disp: , Rfl:    omeprazole (PRILOSEC) 20 MG capsule, TAKE 1 CAPSULE 2 TIMES A DAY BEFORE A MEAL, Disp: 180 capsule, Rfl: 1   potassium chloride SA (KLOR-CON) 20 MEQ tablet, TAKE  (1)  TABLET TWICE A DAY., Disp: 180 tablet, Rfl: 1   sertraline (ZOLOFT) 50 MG tablet, Take 1 tablet (50 mg total) by mouth daily., Disp: 90 tablet, Rfl: 1   spironolactone (ALDACTONE) 25 MG tablet, Take 1 tablet (25  mg total) by mouth daily., Disp: 90 tablet, Rfl: 1   thioridazine (MELLARIL) 50 MG tablet, Take 1 tablet (50 mg total) by mouth at bedtime., Disp: 90 tablet, Rfl: 1   topiramate (TOPAMAX) 100 MG tablet, TAKE  (1)  TABLET TWICE A DAY., Disp: 180 tablet, Rfl: 1   losartan (COZAAR) 25 MG tablet, Take 1 tablet (25 mg total) by mouth daily., Disp: 30 tablet, Rfl: 2   No Known Allergies Past Medical History:  Diagnosis Date   Gait disturbance    GERD (gastroesophageal reflux disease)    HA (headache)    Hernia of abdominal wall    History of hernia surgery    Hypertension    Lower extremity edema    Osteopenia 09/29/2020   Seizures (HCC)    Thyroid disease    Tremor     Past Surgical History:  Procedure Laterality Date   CATARACT EXTRACTION, BILATERAL  STOMACH SURGERY     VENTRAL HERNIA REPAIR  11/09/2020    Social History   Socioeconomic History   Marital status: Single    Spouse name: Not on file   Number of children: 0   Years of education: HS   Highest education level: 12th grade  Occupational History   Occupation: Disabled  Tobacco Use   Smoking status: Never   Smokeless tobacco: Never  Vaping Use   Vaping Use: Never used  Substance and Sexual Activity   Alcohol use: No   Drug use: No   Sexual activity: Not Currently    Birth control/protection: Post-menopausal  Other Topics Concern   Not on file  Social History Narrative   Patient lives at home alone. Patient has a high school education. Her sister comes in frequently to take care of medications, groceries. She now has an Aid that that comes in M-F for 16 hours a week that helps her shower and do oral care.   Caffeine - one soda daily.   Social Determinants of Health   Financial Resource Strain: Low Risk    Difficulty of Paying Living Expenses: Not hard at all  Food Insecurity: No Food Insecurity   Worried About Charity fundraiser in the Last Year: Never true   [place] in the Last Year: Never true   Transportation Needs: Unknown   Lack of Transportation (Medical): Not on file   Lack of Transportation (Non-Medical): No  Physical Activity: Inactive   Days of Exercise per Week: 0 days   Minutes of Exercise per Session: 0 min  Stress: No Stress Concern Present   Feeling of Stress : Not at all  Social Connections: Socially Isolated   Frequency of Communication with Friends and Family: More than three times a week   Frequency of Social Gatherings with Friends and Family: More than three times a week   Attends Religious Services: Never   Marine scientist or Organizations: No   Attends Music therapist: Never   Marital Status: Never married  Human resources officer Violence: Not At Risk   Fear of Current or Ex-Partner: No   Emotionally Abused: No   Physically Abused: No   Sexually Abused: No        Objective:    BP 102/62   Pulse (!) 58   Temp 97.9 F (36.6 C) (Temporal)   Ht 5' 1"  (1.549 m)   Wt 79.7 kg   SpO2 100%   BMI 33.22 kg/m   Wt Readings from Last 3 Encounters:  12/05/20 79.7 kg  09/20/20 80.2 kg  03/22/20 83 kg    Physical Exam Constitutional:      General: She is not in acute distress.    Appearance: Normal appearance. She is obese. She is not ill-appearing, toxic-appearing or diaphoretic.  HENT:     Head: Normocephalic.     Nose: Nose normal.  Eyes:     Extraocular Movements: Extraocular movements intact.     Conjunctiva/sclera: Conjunctivae normal.     Pupils: Pupils are equal, round, and reactive to light.  Pulmonary:     Effort: Pulmonary effort is normal.  Abdominal:     General: A surgical scar is present. There is no distension.     Palpations: Abdomen is soft. There is no mass.     Hernia: No hernia is present.     Comments: Well approximated midline incision, steri strips in place, no erythema or drainage.  Musculoskeletal:  General: Normal range of motion.     Cervical back: Normal range of motion.  Skin:    General:  Skin is warm and dry.  Neurological:     Mental Status: She is alert. Mental status is at baseline.    Lab Results  Component Value Date   TSH 1.680 09/20/2020   Lab Results  Component Value Date   WBC 5.2 09/20/2020   HGB 11.6 09/20/2020   HCT 34.6 09/20/2020   MCV 99 (H) 09/20/2020   PLT 210 09/20/2020   Lab Results  Component Value Date   NA 135 09/20/2020   K 4.9 09/20/2020   CO2 21 09/20/2020   GLUCOSE 100 (H) 09/20/2020   BUN 20 09/20/2020   CREATININE 1.38 (H) 09/20/2020   BILITOT <0.2 09/20/2020   ALKPHOS 91 09/20/2020   AST 16 09/20/2020   ALT 13 09/20/2020   PROT 6.9 09/20/2020   ALBUMIN 4.6 09/20/2020   CALCIUM 9.8 09/20/2020   ANIONGAP 13 09/30/2018   EGFR 41 (L) 09/20/2020   Lab Results  Component Value Date   CHOL 163 09/20/2020   Lab Results  Component Value Date   HDL 55 09/20/2020   Lab Results  Component Value Date   LDLCALC 95 09/20/2020   Lab Results  Component Value Date   TRIG 68 09/20/2020   Lab Results  Component Value Date   CHOLHDL 3.0 09/20/2020   No results found for: HGBA1C

## 2020-12-06 DIAGNOSIS — K219 Gastro-esophageal reflux disease without esophagitis: Secondary | ICD-10-CM | POA: Diagnosis not present

## 2020-12-06 DIAGNOSIS — D631 Anemia in chronic kidney disease: Secondary | ICD-10-CM | POA: Diagnosis not present

## 2020-12-06 DIAGNOSIS — K5900 Constipation, unspecified: Secondary | ICD-10-CM | POA: Diagnosis not present

## 2020-12-06 DIAGNOSIS — N184 Chronic kidney disease, stage 4 (severe): Secondary | ICD-10-CM | POA: Diagnosis not present

## 2020-12-06 DIAGNOSIS — F32A Depression, unspecified: Secondary | ICD-10-CM | POA: Diagnosis not present

## 2020-12-06 DIAGNOSIS — E039 Hypothyroidism, unspecified: Secondary | ICD-10-CM | POA: Diagnosis not present

## 2020-12-06 DIAGNOSIS — I129 Hypertensive chronic kidney disease with stage 1 through stage 4 chronic kidney disease, or unspecified chronic kidney disease: Secondary | ICD-10-CM | POA: Diagnosis not present

## 2020-12-06 DIAGNOSIS — M1990 Unspecified osteoarthritis, unspecified site: Secondary | ICD-10-CM | POA: Diagnosis not present

## 2020-12-06 DIAGNOSIS — I2510 Atherosclerotic heart disease of native coronary artery without angina pectoris: Secondary | ICD-10-CM | POA: Diagnosis not present

## 2020-12-06 DIAGNOSIS — I739 Peripheral vascular disease, unspecified: Secondary | ICD-10-CM | POA: Diagnosis not present

## 2020-12-06 DIAGNOSIS — Z8719 Personal history of other diseases of the digestive system: Secondary | ICD-10-CM | POA: Diagnosis not present

## 2020-12-06 DIAGNOSIS — R251 Tremor, unspecified: Secondary | ICD-10-CM | POA: Diagnosis not present

## 2020-12-06 DIAGNOSIS — M8580 Other specified disorders of bone density and structure, unspecified site: Secondary | ICD-10-CM | POA: Diagnosis not present

## 2020-12-06 DIAGNOSIS — Z48815 Encounter for surgical aftercare following surgery on the digestive system: Secondary | ICD-10-CM | POA: Diagnosis not present

## 2020-12-06 LAB — CBC WITH DIFFERENTIAL/PLATELET
Basophils Absolute: 0.1 10*3/uL (ref 0.0–0.2)
Basos: 1 %
EOS (ABSOLUTE): 0.3 10*3/uL (ref 0.0–0.4)
Eos: 4 %
Hematocrit: 33.5 % — ABNORMAL LOW (ref 34.0–46.6)
Hemoglobin: 11.3 g/dL (ref 11.1–15.9)
Immature Grans (Abs): 0 10*3/uL (ref 0.0–0.1)
Immature Granulocytes: 0 %
Lymphocytes Absolute: 1.8 10*3/uL (ref 0.7–3.1)
Lymphs: 32 %
MCH: 32.2 pg (ref 26.6–33.0)
MCHC: 33.7 g/dL (ref 31.5–35.7)
MCV: 95 fL (ref 79–97)
Monocytes Absolute: 0.6 10*3/uL (ref 0.1–0.9)
Monocytes: 10 %
Neutrophils Absolute: 3 10*3/uL (ref 1.4–7.0)
Neutrophils: 53 %
Platelets: 234 10*3/uL (ref 150–450)
RBC: 3.51 x10E6/uL — ABNORMAL LOW (ref 3.77–5.28)
RDW: 12.4 % (ref 11.7–15.4)
WBC: 5.7 10*3/uL (ref 3.4–10.8)

## 2020-12-06 LAB — BMP8+EGFR
BUN/Creatinine Ratio: 15 (ref 12–28)
BUN: 22 mg/dL (ref 8–27)
CO2: 19 mmol/L — ABNORMAL LOW (ref 20–29)
Calcium: 9 mg/dL (ref 8.7–10.3)
Chloride: 97 mmol/L (ref 96–106)
Creatinine, Ser: 1.49 mg/dL — ABNORMAL HIGH (ref 0.57–1.00)
Glucose: 76 mg/dL (ref 65–99)
Potassium: 4.5 mmol/L (ref 3.5–5.2)
Sodium: 133 mmol/L — ABNORMAL LOW (ref 134–144)
eGFR: 38 mL/min/{1.73_m2} — ABNORMAL LOW (ref >59)

## 2020-12-06 NOTE — Telephone Encounter (Signed)
Transition Care Management Unsuccessful Follow-up Telephone Call  Date of discharge and from where:  [facility] 11/29/20  Diagnosis: rehab post hernia surgery   Attempts:  2nd Attempt  Reason for unsuccessful TCM follow-up call:  No answer/busy  Unable to reach patient

## 2020-12-07 DIAGNOSIS — E039 Hypothyroidism, unspecified: Secondary | ICD-10-CM | POA: Diagnosis not present

## 2020-12-07 DIAGNOSIS — N184 Chronic kidney disease, stage 4 (severe): Secondary | ICD-10-CM | POA: Diagnosis not present

## 2020-12-07 DIAGNOSIS — K5900 Constipation, unspecified: Secondary | ICD-10-CM | POA: Diagnosis not present

## 2020-12-07 DIAGNOSIS — Z48815 Encounter for surgical aftercare following surgery on the digestive system: Secondary | ICD-10-CM | POA: Diagnosis not present

## 2020-12-07 DIAGNOSIS — I129 Hypertensive chronic kidney disease with stage 1 through stage 4 chronic kidney disease, or unspecified chronic kidney disease: Secondary | ICD-10-CM | POA: Diagnosis not present

## 2020-12-07 DIAGNOSIS — I2510 Atherosclerotic heart disease of native coronary artery without angina pectoris: Secondary | ICD-10-CM | POA: Diagnosis not present

## 2020-12-07 DIAGNOSIS — D631 Anemia in chronic kidney disease: Secondary | ICD-10-CM | POA: Diagnosis not present

## 2020-12-07 DIAGNOSIS — I739 Peripheral vascular disease, unspecified: Secondary | ICD-10-CM | POA: Diagnosis not present

## 2020-12-07 DIAGNOSIS — Z8719 Personal history of other diseases of the digestive system: Secondary | ICD-10-CM | POA: Diagnosis not present

## 2020-12-07 DIAGNOSIS — F32A Depression, unspecified: Secondary | ICD-10-CM | POA: Diagnosis not present

## 2020-12-07 DIAGNOSIS — M8580 Other specified disorders of bone density and structure, unspecified site: Secondary | ICD-10-CM | POA: Diagnosis not present

## 2020-12-07 DIAGNOSIS — R251 Tremor, unspecified: Secondary | ICD-10-CM | POA: Diagnosis not present

## 2020-12-07 DIAGNOSIS — M1990 Unspecified osteoarthritis, unspecified site: Secondary | ICD-10-CM | POA: Diagnosis not present

## 2020-12-07 DIAGNOSIS — K219 Gastro-esophageal reflux disease without esophagitis: Secondary | ICD-10-CM | POA: Diagnosis not present

## 2020-12-08 LAB — URINE CULTURE

## 2020-12-11 DIAGNOSIS — Z48815 Encounter for surgical aftercare following surgery on the digestive system: Secondary | ICD-10-CM | POA: Diagnosis not present

## 2020-12-11 DIAGNOSIS — K5900 Constipation, unspecified: Secondary | ICD-10-CM | POA: Diagnosis not present

## 2020-12-11 DIAGNOSIS — M8580 Other specified disorders of bone density and structure, unspecified site: Secondary | ICD-10-CM | POA: Diagnosis not present

## 2020-12-11 DIAGNOSIS — N184 Chronic kidney disease, stage 4 (severe): Secondary | ICD-10-CM | POA: Diagnosis not present

## 2020-12-11 DIAGNOSIS — Z8719 Personal history of other diseases of the digestive system: Secondary | ICD-10-CM | POA: Diagnosis not present

## 2020-12-11 DIAGNOSIS — I129 Hypertensive chronic kidney disease with stage 1 through stage 4 chronic kidney disease, or unspecified chronic kidney disease: Secondary | ICD-10-CM | POA: Diagnosis not present

## 2020-12-11 DIAGNOSIS — D631 Anemia in chronic kidney disease: Secondary | ICD-10-CM | POA: Diagnosis not present

## 2020-12-11 DIAGNOSIS — M1990 Unspecified osteoarthritis, unspecified site: Secondary | ICD-10-CM | POA: Diagnosis not present

## 2020-12-11 DIAGNOSIS — K219 Gastro-esophageal reflux disease without esophagitis: Secondary | ICD-10-CM | POA: Diagnosis not present

## 2020-12-11 DIAGNOSIS — E039 Hypothyroidism, unspecified: Secondary | ICD-10-CM | POA: Diagnosis not present

## 2020-12-11 DIAGNOSIS — F32A Depression, unspecified: Secondary | ICD-10-CM | POA: Diagnosis not present

## 2020-12-11 DIAGNOSIS — I739 Peripheral vascular disease, unspecified: Secondary | ICD-10-CM | POA: Diagnosis not present

## 2020-12-11 DIAGNOSIS — I2510 Atherosclerotic heart disease of native coronary artery without angina pectoris: Secondary | ICD-10-CM | POA: Diagnosis not present

## 2020-12-11 DIAGNOSIS — R251 Tremor, unspecified: Secondary | ICD-10-CM | POA: Diagnosis not present

## 2020-12-12 DIAGNOSIS — I739 Peripheral vascular disease, unspecified: Secondary | ICD-10-CM | POA: Diagnosis not present

## 2020-12-12 DIAGNOSIS — D631 Anemia in chronic kidney disease: Secondary | ICD-10-CM | POA: Diagnosis not present

## 2020-12-12 DIAGNOSIS — M8580 Other specified disorders of bone density and structure, unspecified site: Secondary | ICD-10-CM | POA: Diagnosis not present

## 2020-12-12 DIAGNOSIS — K219 Gastro-esophageal reflux disease without esophagitis: Secondary | ICD-10-CM | POA: Diagnosis not present

## 2020-12-12 DIAGNOSIS — I2510 Atherosclerotic heart disease of native coronary artery without angina pectoris: Secondary | ICD-10-CM | POA: Diagnosis not present

## 2020-12-12 DIAGNOSIS — R251 Tremor, unspecified: Secondary | ICD-10-CM | POA: Diagnosis not present

## 2020-12-12 DIAGNOSIS — Z8719 Personal history of other diseases of the digestive system: Secondary | ICD-10-CM | POA: Diagnosis not present

## 2020-12-12 DIAGNOSIS — I129 Hypertensive chronic kidney disease with stage 1 through stage 4 chronic kidney disease, or unspecified chronic kidney disease: Secondary | ICD-10-CM | POA: Diagnosis not present

## 2020-12-12 DIAGNOSIS — N184 Chronic kidney disease, stage 4 (severe): Secondary | ICD-10-CM | POA: Diagnosis not present

## 2020-12-12 DIAGNOSIS — E039 Hypothyroidism, unspecified: Secondary | ICD-10-CM | POA: Diagnosis not present

## 2020-12-12 DIAGNOSIS — M1990 Unspecified osteoarthritis, unspecified site: Secondary | ICD-10-CM | POA: Diagnosis not present

## 2020-12-12 DIAGNOSIS — F32A Depression, unspecified: Secondary | ICD-10-CM | POA: Diagnosis not present

## 2020-12-12 DIAGNOSIS — Z48815 Encounter for surgical aftercare following surgery on the digestive system: Secondary | ICD-10-CM | POA: Diagnosis not present

## 2020-12-12 DIAGNOSIS — K5900 Constipation, unspecified: Secondary | ICD-10-CM | POA: Diagnosis not present

## 2020-12-13 DIAGNOSIS — R251 Tremor, unspecified: Secondary | ICD-10-CM | POA: Diagnosis not present

## 2020-12-13 DIAGNOSIS — M1990 Unspecified osteoarthritis, unspecified site: Secondary | ICD-10-CM | POA: Diagnosis not present

## 2020-12-13 DIAGNOSIS — F32A Depression, unspecified: Secondary | ICD-10-CM | POA: Diagnosis not present

## 2020-12-13 DIAGNOSIS — K219 Gastro-esophageal reflux disease without esophagitis: Secondary | ICD-10-CM | POA: Diagnosis not present

## 2020-12-13 DIAGNOSIS — N184 Chronic kidney disease, stage 4 (severe): Secondary | ICD-10-CM | POA: Diagnosis not present

## 2020-12-13 DIAGNOSIS — I739 Peripheral vascular disease, unspecified: Secondary | ICD-10-CM | POA: Diagnosis not present

## 2020-12-13 DIAGNOSIS — Z8719 Personal history of other diseases of the digestive system: Secondary | ICD-10-CM | POA: Diagnosis not present

## 2020-12-13 DIAGNOSIS — D631 Anemia in chronic kidney disease: Secondary | ICD-10-CM | POA: Diagnosis not present

## 2020-12-13 DIAGNOSIS — M8580 Other specified disorders of bone density and structure, unspecified site: Secondary | ICD-10-CM | POA: Diagnosis not present

## 2020-12-13 DIAGNOSIS — E039 Hypothyroidism, unspecified: Secondary | ICD-10-CM | POA: Diagnosis not present

## 2020-12-13 DIAGNOSIS — Z48815 Encounter for surgical aftercare following surgery on the digestive system: Secondary | ICD-10-CM | POA: Diagnosis not present

## 2020-12-13 DIAGNOSIS — I2510 Atherosclerotic heart disease of native coronary artery without angina pectoris: Secondary | ICD-10-CM | POA: Diagnosis not present

## 2020-12-13 DIAGNOSIS — I129 Hypertensive chronic kidney disease with stage 1 through stage 4 chronic kidney disease, or unspecified chronic kidney disease: Secondary | ICD-10-CM | POA: Diagnosis not present

## 2020-12-13 DIAGNOSIS — K5900 Constipation, unspecified: Secondary | ICD-10-CM | POA: Diagnosis not present

## 2020-12-14 DIAGNOSIS — M8580 Other specified disorders of bone density and structure, unspecified site: Secondary | ICD-10-CM | POA: Diagnosis not present

## 2020-12-14 DIAGNOSIS — Z48815 Encounter for surgical aftercare following surgery on the digestive system: Secondary | ICD-10-CM | POA: Diagnosis not present

## 2020-12-14 DIAGNOSIS — N184 Chronic kidney disease, stage 4 (severe): Secondary | ICD-10-CM | POA: Diagnosis not present

## 2020-12-14 DIAGNOSIS — K5900 Constipation, unspecified: Secondary | ICD-10-CM | POA: Diagnosis not present

## 2020-12-14 DIAGNOSIS — D631 Anemia in chronic kidney disease: Secondary | ICD-10-CM | POA: Diagnosis not present

## 2020-12-14 DIAGNOSIS — F32A Depression, unspecified: Secondary | ICD-10-CM | POA: Diagnosis not present

## 2020-12-14 DIAGNOSIS — I129 Hypertensive chronic kidney disease with stage 1 through stage 4 chronic kidney disease, or unspecified chronic kidney disease: Secondary | ICD-10-CM | POA: Diagnosis not present

## 2020-12-14 DIAGNOSIS — Z8719 Personal history of other diseases of the digestive system: Secondary | ICD-10-CM | POA: Diagnosis not present

## 2020-12-14 DIAGNOSIS — M1990 Unspecified osteoarthritis, unspecified site: Secondary | ICD-10-CM | POA: Diagnosis not present

## 2020-12-14 DIAGNOSIS — K219 Gastro-esophageal reflux disease without esophagitis: Secondary | ICD-10-CM | POA: Diagnosis not present

## 2020-12-14 DIAGNOSIS — R251 Tremor, unspecified: Secondary | ICD-10-CM | POA: Diagnosis not present

## 2020-12-14 DIAGNOSIS — E039 Hypothyroidism, unspecified: Secondary | ICD-10-CM | POA: Diagnosis not present

## 2020-12-14 DIAGNOSIS — I739 Peripheral vascular disease, unspecified: Secondary | ICD-10-CM | POA: Diagnosis not present

## 2020-12-14 DIAGNOSIS — I2510 Atherosclerotic heart disease of native coronary artery without angina pectoris: Secondary | ICD-10-CM | POA: Diagnosis not present

## 2020-12-17 DIAGNOSIS — I739 Peripheral vascular disease, unspecified: Secondary | ICD-10-CM | POA: Diagnosis not present

## 2020-12-17 DIAGNOSIS — E039 Hypothyroidism, unspecified: Secondary | ICD-10-CM | POA: Diagnosis not present

## 2020-12-17 DIAGNOSIS — M1990 Unspecified osteoarthritis, unspecified site: Secondary | ICD-10-CM | POA: Diagnosis not present

## 2020-12-17 DIAGNOSIS — K5900 Constipation, unspecified: Secondary | ICD-10-CM | POA: Diagnosis not present

## 2020-12-17 DIAGNOSIS — D631 Anemia in chronic kidney disease: Secondary | ICD-10-CM | POA: Diagnosis not present

## 2020-12-17 DIAGNOSIS — M8580 Other specified disorders of bone density and structure, unspecified site: Secondary | ICD-10-CM | POA: Diagnosis not present

## 2020-12-17 DIAGNOSIS — K219 Gastro-esophageal reflux disease without esophagitis: Secondary | ICD-10-CM | POA: Diagnosis not present

## 2020-12-17 DIAGNOSIS — I129 Hypertensive chronic kidney disease with stage 1 through stage 4 chronic kidney disease, or unspecified chronic kidney disease: Secondary | ICD-10-CM | POA: Diagnosis not present

## 2020-12-17 DIAGNOSIS — R251 Tremor, unspecified: Secondary | ICD-10-CM | POA: Diagnosis not present

## 2020-12-17 DIAGNOSIS — F32A Depression, unspecified: Secondary | ICD-10-CM | POA: Diagnosis not present

## 2020-12-17 DIAGNOSIS — Z48815 Encounter for surgical aftercare following surgery on the digestive system: Secondary | ICD-10-CM | POA: Diagnosis not present

## 2020-12-17 DIAGNOSIS — N184 Chronic kidney disease, stage 4 (severe): Secondary | ICD-10-CM | POA: Diagnosis not present

## 2020-12-17 DIAGNOSIS — I2510 Atherosclerotic heart disease of native coronary artery without angina pectoris: Secondary | ICD-10-CM | POA: Diagnosis not present

## 2020-12-17 DIAGNOSIS — Z8719 Personal history of other diseases of the digestive system: Secondary | ICD-10-CM | POA: Diagnosis not present

## 2020-12-18 DIAGNOSIS — F32A Depression, unspecified: Secondary | ICD-10-CM | POA: Diagnosis not present

## 2020-12-18 DIAGNOSIS — E039 Hypothyroidism, unspecified: Secondary | ICD-10-CM | POA: Diagnosis not present

## 2020-12-18 DIAGNOSIS — I2510 Atherosclerotic heart disease of native coronary artery without angina pectoris: Secondary | ICD-10-CM | POA: Diagnosis not present

## 2020-12-18 DIAGNOSIS — I739 Peripheral vascular disease, unspecified: Secondary | ICD-10-CM | POA: Diagnosis not present

## 2020-12-18 DIAGNOSIS — M8580 Other specified disorders of bone density and structure, unspecified site: Secondary | ICD-10-CM | POA: Diagnosis not present

## 2020-12-18 DIAGNOSIS — Z8719 Personal history of other diseases of the digestive system: Secondary | ICD-10-CM | POA: Diagnosis not present

## 2020-12-18 DIAGNOSIS — M1990 Unspecified osteoarthritis, unspecified site: Secondary | ICD-10-CM | POA: Diagnosis not present

## 2020-12-18 DIAGNOSIS — I129 Hypertensive chronic kidney disease with stage 1 through stage 4 chronic kidney disease, or unspecified chronic kidney disease: Secondary | ICD-10-CM | POA: Diagnosis not present

## 2020-12-18 DIAGNOSIS — K219 Gastro-esophageal reflux disease without esophagitis: Secondary | ICD-10-CM | POA: Diagnosis not present

## 2020-12-18 DIAGNOSIS — R251 Tremor, unspecified: Secondary | ICD-10-CM | POA: Diagnosis not present

## 2020-12-18 DIAGNOSIS — N184 Chronic kidney disease, stage 4 (severe): Secondary | ICD-10-CM | POA: Diagnosis not present

## 2020-12-18 DIAGNOSIS — Z48815 Encounter for surgical aftercare following surgery on the digestive system: Secondary | ICD-10-CM | POA: Diagnosis not present

## 2020-12-18 DIAGNOSIS — D631 Anemia in chronic kidney disease: Secondary | ICD-10-CM | POA: Diagnosis not present

## 2020-12-18 DIAGNOSIS — K5900 Constipation, unspecified: Secondary | ICD-10-CM | POA: Diagnosis not present

## 2020-12-19 DIAGNOSIS — M1990 Unspecified osteoarthritis, unspecified site: Secondary | ICD-10-CM | POA: Diagnosis not present

## 2020-12-19 DIAGNOSIS — E039 Hypothyroidism, unspecified: Secondary | ICD-10-CM | POA: Diagnosis not present

## 2020-12-19 DIAGNOSIS — D631 Anemia in chronic kidney disease: Secondary | ICD-10-CM | POA: Diagnosis not present

## 2020-12-19 DIAGNOSIS — I739 Peripheral vascular disease, unspecified: Secondary | ICD-10-CM | POA: Diagnosis not present

## 2020-12-19 DIAGNOSIS — N184 Chronic kidney disease, stage 4 (severe): Secondary | ICD-10-CM | POA: Diagnosis not present

## 2020-12-19 DIAGNOSIS — K219 Gastro-esophageal reflux disease without esophagitis: Secondary | ICD-10-CM | POA: Diagnosis not present

## 2020-12-19 DIAGNOSIS — F32A Depression, unspecified: Secondary | ICD-10-CM | POA: Diagnosis not present

## 2020-12-19 DIAGNOSIS — R251 Tremor, unspecified: Secondary | ICD-10-CM | POA: Diagnosis not present

## 2020-12-19 DIAGNOSIS — Z48815 Encounter for surgical aftercare following surgery on the digestive system: Secondary | ICD-10-CM | POA: Diagnosis not present

## 2020-12-19 DIAGNOSIS — K5900 Constipation, unspecified: Secondary | ICD-10-CM | POA: Diagnosis not present

## 2020-12-19 DIAGNOSIS — I2510 Atherosclerotic heart disease of native coronary artery without angina pectoris: Secondary | ICD-10-CM | POA: Diagnosis not present

## 2020-12-19 DIAGNOSIS — I129 Hypertensive chronic kidney disease with stage 1 through stage 4 chronic kidney disease, or unspecified chronic kidney disease: Secondary | ICD-10-CM | POA: Diagnosis not present

## 2020-12-19 DIAGNOSIS — M8580 Other specified disorders of bone density and structure, unspecified site: Secondary | ICD-10-CM | POA: Diagnosis not present

## 2020-12-19 DIAGNOSIS — Z8719 Personal history of other diseases of the digestive system: Secondary | ICD-10-CM | POA: Diagnosis not present

## 2020-12-20 DIAGNOSIS — M1990 Unspecified osteoarthritis, unspecified site: Secondary | ICD-10-CM | POA: Diagnosis not present

## 2020-12-20 DIAGNOSIS — I2510 Atherosclerotic heart disease of native coronary artery without angina pectoris: Secondary | ICD-10-CM | POA: Diagnosis not present

## 2020-12-20 DIAGNOSIS — K5900 Constipation, unspecified: Secondary | ICD-10-CM | POA: Diagnosis not present

## 2020-12-20 DIAGNOSIS — M8580 Other specified disorders of bone density and structure, unspecified site: Secondary | ICD-10-CM | POA: Diagnosis not present

## 2020-12-20 DIAGNOSIS — I739 Peripheral vascular disease, unspecified: Secondary | ICD-10-CM | POA: Diagnosis not present

## 2020-12-20 DIAGNOSIS — N184 Chronic kidney disease, stage 4 (severe): Secondary | ICD-10-CM | POA: Diagnosis not present

## 2020-12-20 DIAGNOSIS — D631 Anemia in chronic kidney disease: Secondary | ICD-10-CM | POA: Diagnosis not present

## 2020-12-20 DIAGNOSIS — F32A Depression, unspecified: Secondary | ICD-10-CM | POA: Diagnosis not present

## 2020-12-20 DIAGNOSIS — K219 Gastro-esophageal reflux disease without esophagitis: Secondary | ICD-10-CM | POA: Diagnosis not present

## 2020-12-20 DIAGNOSIS — E039 Hypothyroidism, unspecified: Secondary | ICD-10-CM | POA: Diagnosis not present

## 2020-12-20 DIAGNOSIS — Z48815 Encounter for surgical aftercare following surgery on the digestive system: Secondary | ICD-10-CM | POA: Diagnosis not present

## 2020-12-20 DIAGNOSIS — Z8719 Personal history of other diseases of the digestive system: Secondary | ICD-10-CM | POA: Diagnosis not present

## 2020-12-20 DIAGNOSIS — I129 Hypertensive chronic kidney disease with stage 1 through stage 4 chronic kidney disease, or unspecified chronic kidney disease: Secondary | ICD-10-CM | POA: Diagnosis not present

## 2020-12-20 DIAGNOSIS — R251 Tremor, unspecified: Secondary | ICD-10-CM | POA: Diagnosis not present

## 2020-12-24 DIAGNOSIS — K5900 Constipation, unspecified: Secondary | ICD-10-CM | POA: Diagnosis not present

## 2020-12-24 DIAGNOSIS — R251 Tremor, unspecified: Secondary | ICD-10-CM | POA: Diagnosis not present

## 2020-12-24 DIAGNOSIS — I739 Peripheral vascular disease, unspecified: Secondary | ICD-10-CM | POA: Diagnosis not present

## 2020-12-24 DIAGNOSIS — M8580 Other specified disorders of bone density and structure, unspecified site: Secondary | ICD-10-CM | POA: Diagnosis not present

## 2020-12-24 DIAGNOSIS — N184 Chronic kidney disease, stage 4 (severe): Secondary | ICD-10-CM | POA: Diagnosis not present

## 2020-12-24 DIAGNOSIS — K219 Gastro-esophageal reflux disease without esophagitis: Secondary | ICD-10-CM | POA: Diagnosis not present

## 2020-12-24 DIAGNOSIS — I129 Hypertensive chronic kidney disease with stage 1 through stage 4 chronic kidney disease, or unspecified chronic kidney disease: Secondary | ICD-10-CM | POA: Diagnosis not present

## 2020-12-24 DIAGNOSIS — D631 Anemia in chronic kidney disease: Secondary | ICD-10-CM | POA: Diagnosis not present

## 2020-12-24 DIAGNOSIS — M1990 Unspecified osteoarthritis, unspecified site: Secondary | ICD-10-CM | POA: Diagnosis not present

## 2020-12-24 DIAGNOSIS — F32A Depression, unspecified: Secondary | ICD-10-CM | POA: Diagnosis not present

## 2020-12-24 DIAGNOSIS — E039 Hypothyroidism, unspecified: Secondary | ICD-10-CM | POA: Diagnosis not present

## 2020-12-24 DIAGNOSIS — Z8719 Personal history of other diseases of the digestive system: Secondary | ICD-10-CM | POA: Diagnosis not present

## 2020-12-24 DIAGNOSIS — I2510 Atherosclerotic heart disease of native coronary artery without angina pectoris: Secondary | ICD-10-CM | POA: Diagnosis not present

## 2020-12-24 DIAGNOSIS — Z48815 Encounter for surgical aftercare following surgery on the digestive system: Secondary | ICD-10-CM | POA: Diagnosis not present

## 2020-12-27 DIAGNOSIS — K219 Gastro-esophageal reflux disease without esophagitis: Secondary | ICD-10-CM | POA: Diagnosis not present

## 2020-12-27 DIAGNOSIS — I739 Peripheral vascular disease, unspecified: Secondary | ICD-10-CM | POA: Diagnosis not present

## 2020-12-27 DIAGNOSIS — E039 Hypothyroidism, unspecified: Secondary | ICD-10-CM | POA: Diagnosis not present

## 2020-12-27 DIAGNOSIS — M8580 Other specified disorders of bone density and structure, unspecified site: Secondary | ICD-10-CM | POA: Diagnosis not present

## 2020-12-27 DIAGNOSIS — Z8719 Personal history of other diseases of the digestive system: Secondary | ICD-10-CM | POA: Diagnosis not present

## 2020-12-27 DIAGNOSIS — Z48815 Encounter for surgical aftercare following surgery on the digestive system: Secondary | ICD-10-CM | POA: Diagnosis not present

## 2020-12-27 DIAGNOSIS — D631 Anemia in chronic kidney disease: Secondary | ICD-10-CM | POA: Diagnosis not present

## 2020-12-27 DIAGNOSIS — N184 Chronic kidney disease, stage 4 (severe): Secondary | ICD-10-CM | POA: Diagnosis not present

## 2020-12-27 DIAGNOSIS — M1990 Unspecified osteoarthritis, unspecified site: Secondary | ICD-10-CM | POA: Diagnosis not present

## 2020-12-27 DIAGNOSIS — I2510 Atherosclerotic heart disease of native coronary artery without angina pectoris: Secondary | ICD-10-CM | POA: Diagnosis not present

## 2020-12-27 DIAGNOSIS — K5900 Constipation, unspecified: Secondary | ICD-10-CM | POA: Diagnosis not present

## 2020-12-27 DIAGNOSIS — I129 Hypertensive chronic kidney disease with stage 1 through stage 4 chronic kidney disease, or unspecified chronic kidney disease: Secondary | ICD-10-CM | POA: Diagnosis not present

## 2020-12-27 DIAGNOSIS — F32A Depression, unspecified: Secondary | ICD-10-CM | POA: Diagnosis not present

## 2020-12-27 DIAGNOSIS — R251 Tremor, unspecified: Secondary | ICD-10-CM | POA: Diagnosis not present

## 2021-01-16 ENCOUNTER — Ambulatory Visit: Payer: Medicare Other | Admitting: Family Medicine

## 2021-01-18 ENCOUNTER — Encounter: Payer: Self-pay | Admitting: Family Medicine

## 2021-01-21 IMAGING — CT CT ABDOMEN AND PELVIS WITH CONTRAST
2 of 5 series · 16 of 46 positions shown, 18 images · IV contrast (Isovue)
Comparison: None

CLINICAL DATA: Abdominal pain in RIGHT lower quadrant, blood in
stool, symptoms for 1 day, diabetes mellitus, hypertension, GERD

EXAM:
CT ABDOMEN AND PELVIS WITH CONTRAST
TECHNIQUE: Multidetector CT imaging of the abdomen and pelvis was performed
using the standard protocol following bolus administration of
intravenous contrast. Sagittal and coronal MPR images reconstructed
from axial data set.
CONTRAST:  75mL OMNIPAQUE IOHEXOL 300 MG/ML SOLN IV. No oral
contrast.

[Series 2: axial st · axial · 0.86mm/px · z∈[+760,+1180]mm · 13 of 100 slices shown, 15 images]
[im 8/100  soft-tissue]
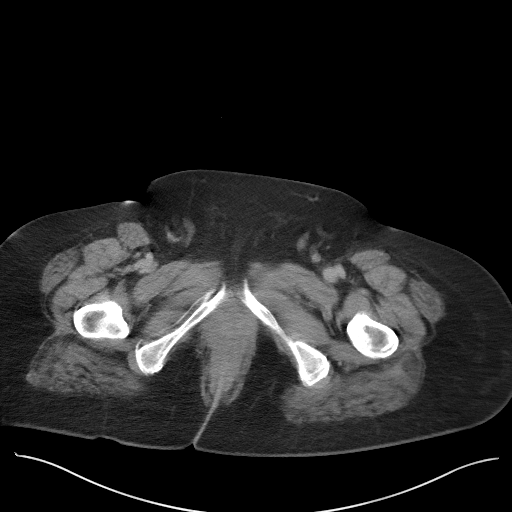
[im 8/100  bone]
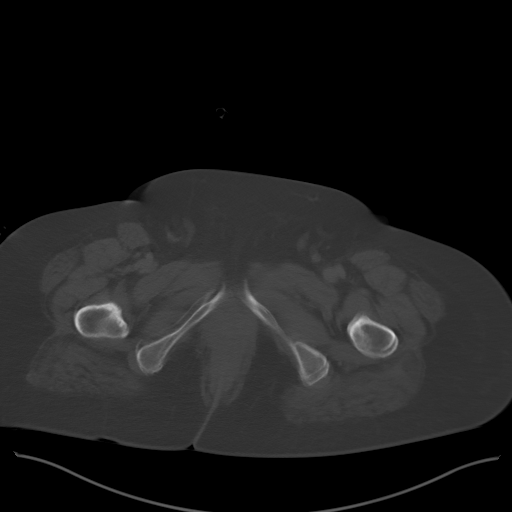
[im 15/100  soft-tissue]
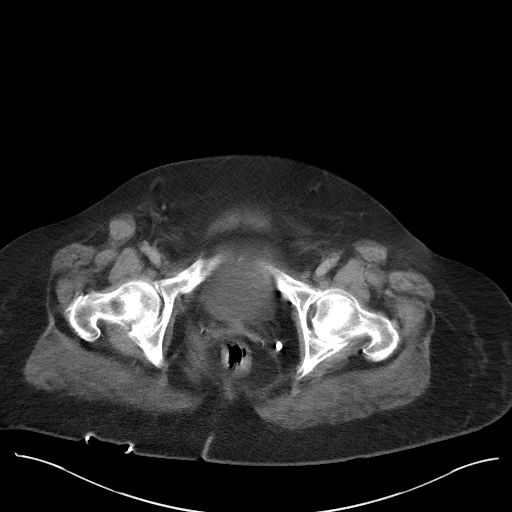
[im 22/100  soft-tissue]
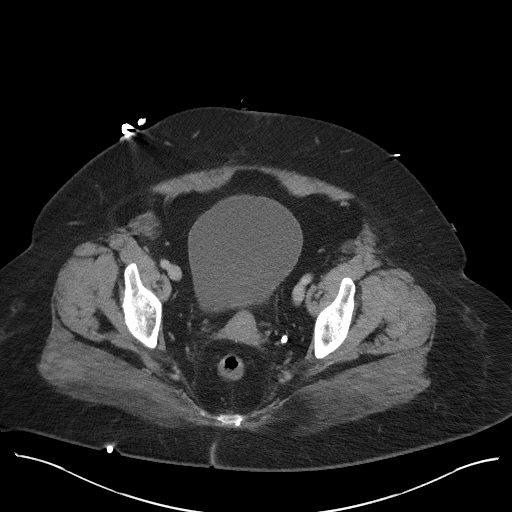
[im 29/100  soft-tissue]
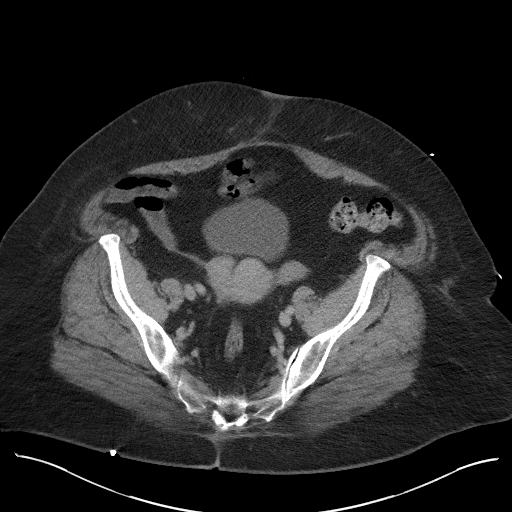
[im 36/100  soft-tissue]
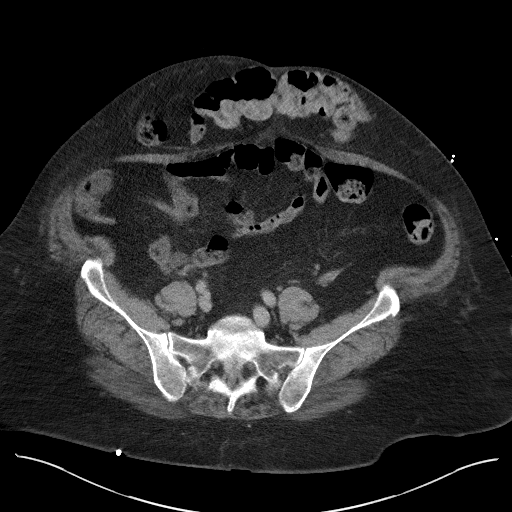
[im 43/100  soft-tissue]
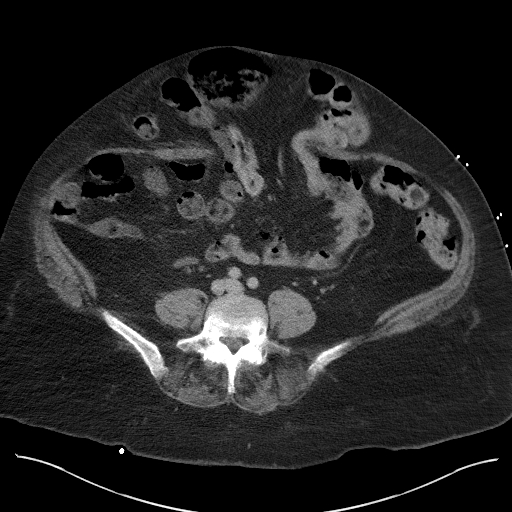
[im 50/100  soft-tissue]
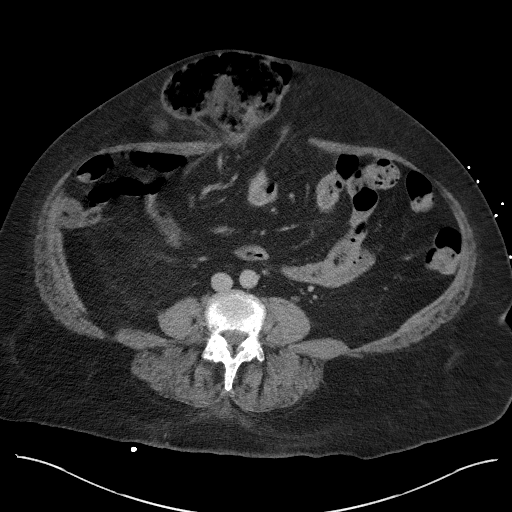
[im 57/100  soft-tissue]
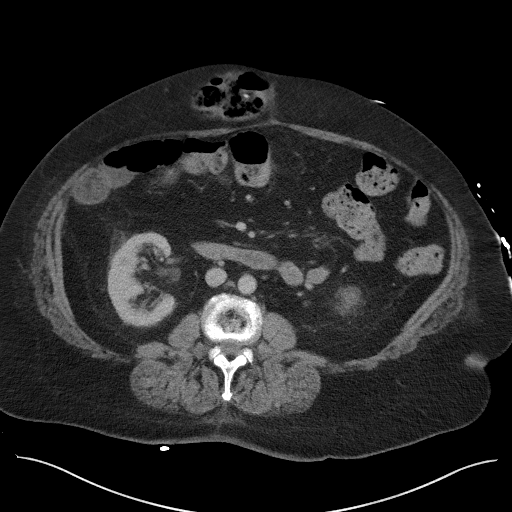
[im 64/100  soft-tissue]
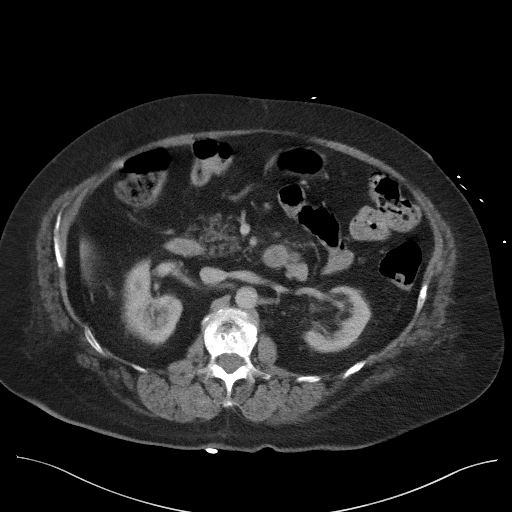
[im 64/100  bone]
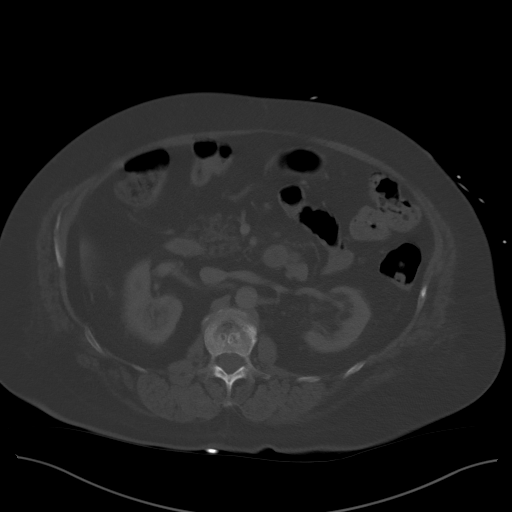
[im 71/100  soft-tissue]
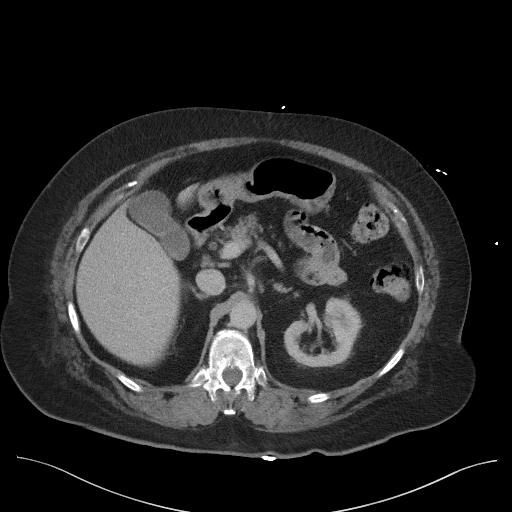
[im 78/100  soft-tissue]
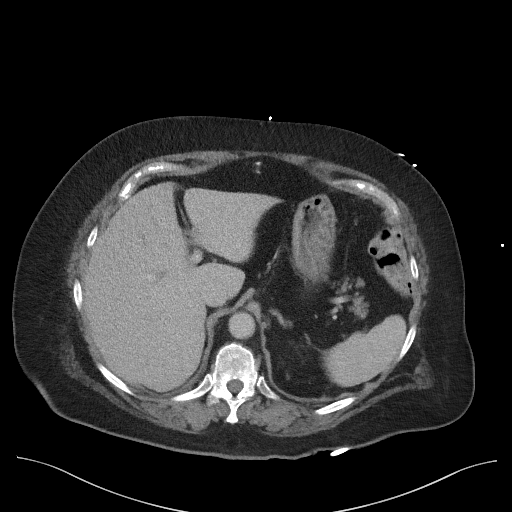
[im 85/100  soft-tissue]
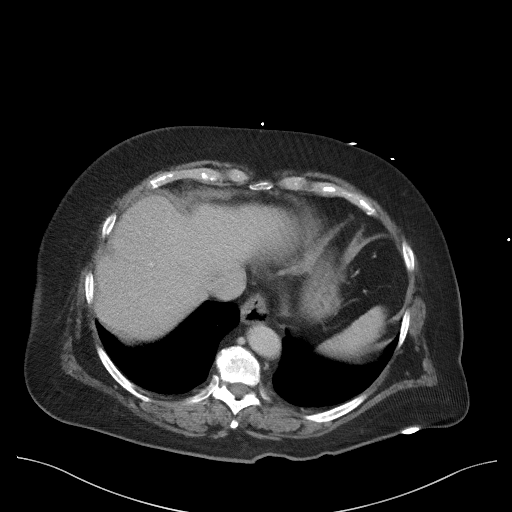
[im 92/100  soft-tissue]
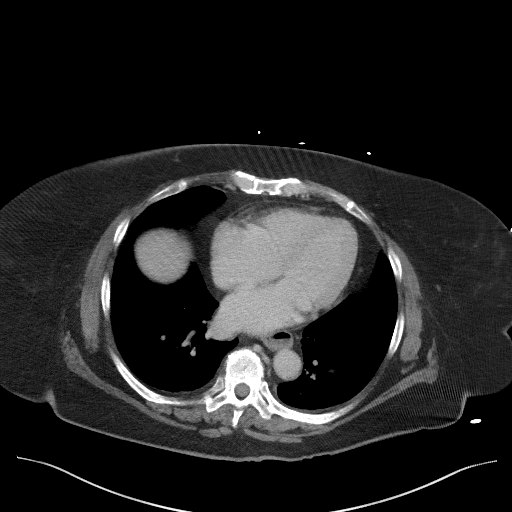

[Series 6: coronal st · coronal · 0.87mm/px · 3 of 117 slices shown]
[im 39/117  soft-tissue]
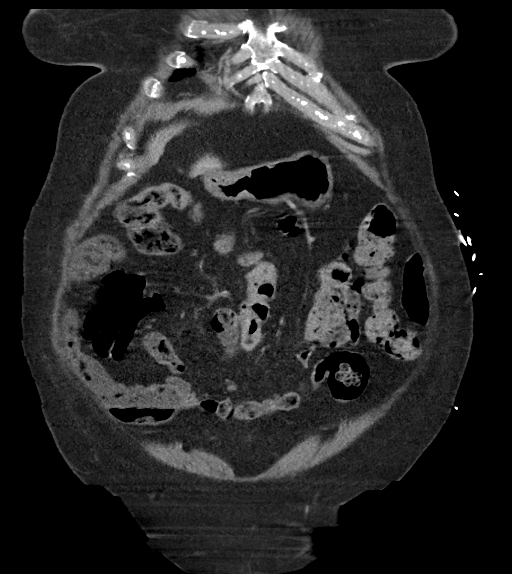
[im 52/117  soft-tissue]
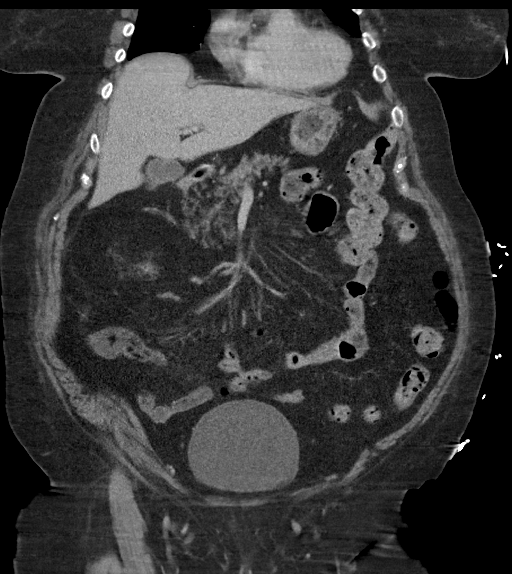
[im 65/117  soft-tissue]
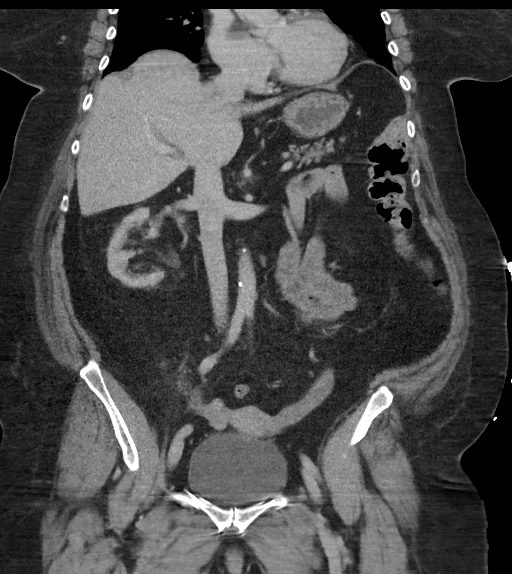

[16 of 46 positions shown; findings below may reference images not displayed]

FINDINGS: Lower chest: Lung bases clear

Hepatobiliary: Gallbladder and liver normal appearance

Pancreas: Partial fatty replacement of pancreatic head and uncinate.
Otherwise normal appearance

Spleen: Normal appearance

Adrenals/Urinary Tract: Adrenal glands normal appearance. BILATERAL
renal cortical atrophy without mass or hydronephrosis. No ureteral
calcification or dilatation. Bladder unremarkable.

Stomach/Bowel: Large portion of the transverse colon is seen
extending into a large ventral hernia at umbilicus. No evidence of
bowel obstruction, bowel wall thickening or bowel dilatation.
Stomach and remaining bowel loops normal appearance.

Vascular/Lymphatic: Atherosclerotic calcifications aorta without
aneurysm. No adenopathy.

Reproductive: Atrophic uterus containing multiple nodules likely
small leiomyomata up to 2.6 cm diameter. Ovaries unremarkable

Other: No free air or free fluid. No acute inflammatory process.
Ventral/umbilical hernia as above with fascial defect measuring
x 9.9 cm.

Musculoskeletal: Bones demineralized but grossly intact. Motion
artifacts are present at the inferior pelvis.
IMPRESSION: Atrophic uterus containing probable small uterine leiomyomata.

Large ventral/umbilical hernia with fascial defect 8.5 x 9.9 cm
diameter, containing nonobstructed transverse colon.

No additional intra-abdominal or intrapelvic abnormalities.

## 2021-01-23 ENCOUNTER — Encounter: Payer: Self-pay | Admitting: Family Medicine

## 2021-01-23 ENCOUNTER — Other Ambulatory Visit: Payer: Self-pay

## 2021-01-23 ENCOUNTER — Ambulatory Visit (INDEPENDENT_AMBULATORY_CARE_PROVIDER_SITE_OTHER): Payer: Medicare Other | Admitting: Family Medicine

## 2021-01-23 VITALS — BP 125/73 | HR 60 | Temp 97.1°F | Ht 61.0 in | Wt 170.2 lb

## 2021-01-23 DIAGNOSIS — Z23 Encounter for immunization: Secondary | ICD-10-CM

## 2021-01-23 DIAGNOSIS — I10 Essential (primary) hypertension: Secondary | ICD-10-CM

## 2021-01-23 DIAGNOSIS — Z0000 Encounter for general adult medical examination without abnormal findings: Secondary | ICD-10-CM

## 2021-01-23 DIAGNOSIS — M1990 Unspecified osteoarthritis, unspecified site: Secondary | ICD-10-CM | POA: Diagnosis not present

## 2021-01-23 NOTE — Progress Notes (Signed)
Assessment & Plan:  1. Essential hypertension Well controlled on current regimen.  - continue medications as prescribed - encouraged to take blood pressures at home and keep a log  2. Arthritis - continue Tylenol as needed for pain  3. Healthcare maintenance - encouraged to schedule mammogram  4. Need for immunization against influenza - influenza vaccine today   Return in about 3 months (around 04/25/2021) for annual physical.  Lucile Crater, NP Student  I personally was present during the history, physical exam, and medical decision-making activities of this service and have verified that the service and findings are accurately documented in the nurse practitioner student's note.  Hendricks Limes, MSN, APRN, FNP-C [facility]   Subjective:    Patient ID: Pam Sanders, female    DOB: February 21, 1951, 70 y.o.   MRN: [id number]  Patient Care Team: Loman Brooklyn, FNP as PCP - General (Family Medicine) Danie Binder, MD (Inactive) as Consulting Physician (Gastroenterology) Marcial Pacas, MD as Consulting Physician (Neurology)   Chief Complaint:  Chief Complaint  Patient presents with   Hypertension    6 week follow up     HPI: Pam Sanders is a 70 y.o. female presenting on 01/23/2021 for Hypertension (6 week follow up )  She is accompanied by a caregiver who she is agreeable to being present.   At her last visit we reconciled her medications since her hospitalization for hernia repair and rehab treatment altered her course. She was previously on 50 mg  losartan but was taking 25 mg. Her blood pressure was within acceptable range at her last visit and she was kept on this dose. She does not take her blood pressure at home, but has a cuff if needed.   Her caregiver states the patient has chronic pain in her legs, back, arms, and neck at she takes tylenol for. The patient states this is sufficient pain management for her.   New  complaints: None   Social history:  Relevant past medical, surgical, family and social history reviewed and updated as indicated. Interim medical history since our last visit reviewed.  Allergies and medications reviewed and updated.  DATA REVIEWED: CHART IN EPIC  ROS: Negative unless specifically indicated above in HPI.    Current Outpatient Medications:    alum & mag hydroxide-simeth (MAALOX/MYLANTA) 200-200-20 MG/5ML suspension, Take 30 mLs by mouth every 6 (six) hours as needed for indigestion or heartburn., Disp: , Rfl:    atorvastatin (LIPITOR) 10 MG tablet, Take 1 tablet (10 mg total) by mouth daily., Disp: 90 tablet, Rfl: 1   Cholecalciferol (VITAMIN D-3) 1000 UNITS CAPS, Take 1,000 Units by mouth daily. , Disp: , Rfl:    Clotrimazole 1 % OINT, Apply 1 application topically 2 (two) times daily., Disp: 56.7 g, Rfl: 0   famotidine (PEPCID) 20 MG tablet, Take 1 tablet (20 mg total) by mouth at bedtime., Disp: 90 tablet, Rfl: 1   fluticasone (FLONASE) 50 MCG/ACT nasal spray, USE 2 SPRAYS INTO EACH NOSTRIL ONCE DAILY AT BEDTIME, Disp: 16 g, Rfl: 5   furosemide (LASIX) 20 MG tablet, Take 1 tablet (20 mg total) by mouth daily., Disp: 90 tablet, Rfl: 1   lamoTRIgine (LAMICTAL) 100 MG tablet, TAKE  (1)  TABLET TWICE A DAY., Disp: 180 tablet, Rfl: 1   levothyroxine (SYNTHROID) 137 MCG tablet, TAKE 1 TABLET DAILY BEFORE BREAKFAST, Disp: 90 tablet, Rfl: 2   LINZESS 72 MCG capsule, TAKE 1 CAPSULE DAILY BEFORE BREAKFAST, Disp: 30  capsule, Rfl: 11   loratadine (CLARITIN) 10 MG tablet, Take 1 tablet (10 mg total) by mouth daily., Disp: 90 tablet, Rfl: 1   losartan (COZAAR) 25 MG tablet, Take 1 tablet (25 mg total) by mouth daily., Disp: 30 tablet, Rfl: 2   Melatonin 5 MG CAPS, Take 5 mg by mouth at bedtime., Disp: , Rfl:    Multiple Vitamin (MULTIVITAMIN WITH MINERALS) TABS tablet, Take 1 tablet by mouth daily., Disp: , Rfl:    Olopatadine HCl 0.2 % SOLN, Apply 1 drop to eye daily., Disp: 2.5  mL, Rfl: 0   Omega-3 Fatty Acids (FISH OIL) 1000 MG CAPS, Take 1 capsule by mouth daily. , Disp: , Rfl:    omeprazole (PRILOSEC) 20 MG capsule, TAKE 1 CAPSULE 2 TIMES A DAY BEFORE A MEAL, Disp: 180 capsule, Rfl: 1   potassium chloride SA (KLOR-CON) 20 MEQ tablet, TAKE  (1)  TABLET TWICE A DAY., Disp: 180 tablet, Rfl: 1   sertraline (ZOLOFT) 50 MG tablet, Take 1 tablet (50 mg total) by mouth daily., Disp: 90 tablet, Rfl: 1   spironolactone (ALDACTONE) 25 MG tablet, Take 1 tablet (25 mg total) by mouth daily., Disp: 90 tablet, Rfl: 1   thioridazine (MELLARIL) 50 MG tablet, Take 1 tablet (50 mg total) by mouth at bedtime., Disp: 90 tablet, Rfl: 1   topiramate (TOPAMAX) 100 MG tablet, TAKE  (1)  TABLET TWICE A DAY., Disp: 180 tablet, Rfl: 1   No Known Allergies Past Medical History:  Diagnosis Date   Gait disturbance    GERD (gastroesophageal reflux disease)    HA (headache)    Hernia of abdominal wall    History of hernia surgery    Hypertension    Lower extremity edema    Osteopenia 09/29/2020   Seizures (HCC)    Thyroid disease    Tremor     Past Surgical History:  Procedure Laterality Date   CATARACT EXTRACTION, BILATERAL     STOMACH SURGERY     VENTRAL HERNIA REPAIR  11/09/2020    Social History   Socioeconomic History   Marital status: Single    Spouse name: Not on file   Number of children: 0   Years of education: HS   Highest education level: 12th grade  Occupational History   Occupation: Disabled  Tobacco Use   Smoking status: Never   Smokeless tobacco: Never  Vaping Use   Vaping Use: Never used  Substance and Sexual Activity   Alcohol use: No   Drug use: No   Sexual activity: Not Currently    Birth control/protection: Post-menopausal  Other Topics Concern   Not on file  Social History Narrative   Patient lives at home alone. Patient has a high school education. Her sister comes in frequently to take care of medications, groceries. She now has an Aid that  that comes in M-F for 16 hours a week that helps her shower and do oral care.   Caffeine - one soda daily.   Social Determinants of Health   Financial Resource Strain: Low Risk    Difficulty of Paying Living Expenses: Not hard at all  Food Insecurity: No Food Insecurity   Worried About Charity fundraiser in the Last Year: Never true   [place] in the Last Year: Never true  Transportation Needs: Unknown   Lack of Transportation (Medical): Not on file   Lack of Transportation (Non-Medical): No  Physical Activity: Inactive   Days of Exercise  per Week: 0 days   Minutes of Exercise per Session: 0 min  Stress: No Stress Concern Present   Feeling of Stress : Not at all  Social Connections: Socially Isolated   Frequency of Communication with Friends and Family: More than three times a week   Frequency of Social Gatherings with Friends and Family: More than three times a week   Attends Religious Services: Never   Marine scientist or Organizations: No   Attends Music therapist: Never   Marital Status: Never married  Human resources officer Violence: Not At Risk   Fear of Current or Ex-Partner: No   Emotionally Abused: No   Physically Abused: No   Sexually Abused: No        Objective:    BP 125/73   Pulse 60   Temp (!) 97.1 F (36.2 C) (Temporal)   Ht 5' 1"  (1.549 m)   Wt 77.2 kg   SpO2 99%   BMI 32.16 kg/m   Wt Readings from Last 3 Encounters:  01/23/21 170 lb 3.2 oz (77.2 kg)  12/05/20 175 lb 12.8 oz (79.7 kg)  09/20/20 176 lb 12.8 oz (80.2 kg)    Physical Exam Vitals reviewed.  Constitutional:      General: She is not in acute distress.    Appearance: Normal appearance. She is obese. She is not ill-appearing, toxic-appearing or diaphoretic.  HENT:     Head: Normocephalic and atraumatic.  Eyes:     General: No scleral icterus.       Right eye: No discharge.        Left eye: No discharge.     Conjunctiva/sclera: Conjunctivae normal.   Cardiovascular:     Rate and Rhythm: Normal rate and regular rhythm.     Heart sounds: Normal heart sounds. No murmur heard.   No friction rub. No gallop.  Pulmonary:     Effort: Pulmonary effort is normal. No respiratory distress.     Breath sounds: Normal breath sounds. No stridor. No wheezing, rhonchi or rales.  Musculoskeletal:        General: Normal range of motion.     Cervical back: Normal range of motion.  Skin:    General: Skin is warm and dry.     Capillary Refill: Capillary refill takes less than 2 seconds.  Neurological:     General: No focal deficit present.     Mental Status: She is alert and oriented to person, place, and time. Mental status is at baseline.  Psychiatric:        Mood and Affect: Mood normal.        Behavior: Behavior normal.        Thought Content: Thought content normal.        Judgment: Judgment normal.    Lab Results  Component Value Date   TSH 1.680 09/20/2020   Lab Results  Component Value Date   WBC 5.7 12/05/2020   HGB 11.3 12/05/2020   HCT 33.5 (L) 12/05/2020   MCV 95 12/05/2020   PLT 234 12/05/2020   Lab Results  Component Value Date   NA 133 (L) 12/05/2020   K 4.5 12/05/2020   CO2 19 (L) 12/05/2020   GLUCOSE 76 12/05/2020   BUN 22 12/05/2020   CREATININE 1.49 (H) 12/05/2020   BILITOT <0.2 09/20/2020   ALKPHOS 91 09/20/2020   AST 16 09/20/2020   ALT 13 09/20/2020   PROT 6.9 09/20/2020   ALBUMIN 4.6 09/20/2020   CALCIUM 9.0  12/05/2020   ANIONGAP 13 09/30/2018   EGFR 38 (L) 12/05/2020   Lab Results  Component Value Date   CHOL 163 09/20/2020   Lab Results  Component Value Date   HDL 55 09/20/2020   Lab Results  Component Value Date   LDLCALC 95 09/20/2020   Lab Results  Component Value Date   TRIG 68 09/20/2020   Lab Results  Component Value Date   CHOLHDL 3.0 09/20/2020   No results found for: HGBA1C

## 2021-02-18 ENCOUNTER — Other Ambulatory Visit: Payer: Self-pay | Admitting: Family Medicine

## 2021-02-18 DIAGNOSIS — Z1231 Encounter for screening mammogram for malignant neoplasm of breast: Secondary | ICD-10-CM

## 2021-02-20 ENCOUNTER — Other Ambulatory Visit: Payer: Self-pay

## 2021-02-20 ENCOUNTER — Encounter: Payer: Self-pay | Admitting: Family Medicine

## 2021-02-20 ENCOUNTER — Ambulatory Visit (INDEPENDENT_AMBULATORY_CARE_PROVIDER_SITE_OTHER): Payer: Medicare Other | Admitting: Family Medicine

## 2021-02-20 ENCOUNTER — Other Ambulatory Visit: Payer: Self-pay | Admitting: Family Medicine

## 2021-02-20 VITALS — BP 110/69 | HR 60 | Temp 97.7°F | Ht 61.0 in | Wt 170.6 lb

## 2021-02-20 DIAGNOSIS — R4689 Other symptoms and signs involving appearance and behavior: Secondary | ICD-10-CM

## 2021-02-20 DIAGNOSIS — I10 Essential (primary) hypertension: Secondary | ICD-10-CM

## 2021-02-20 DIAGNOSIS — Z9189 Other specified personal risk factors, not elsewhere classified: Secondary | ICD-10-CM | POA: Diagnosis not present

## 2021-02-20 DIAGNOSIS — F259 Schizoaffective disorder, unspecified: Secondary | ICD-10-CM | POA: Diagnosis not present

## 2021-02-20 DIAGNOSIS — Z9181 History of falling: Secondary | ICD-10-CM

## 2021-02-20 DIAGNOSIS — F79 Unspecified intellectual disabilities: Secondary | ICD-10-CM | POA: Diagnosis not present
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
## 2021-02-22 ENCOUNTER — Encounter: Payer: Self-pay | Admitting: Family Medicine

## 2021-02-26 ENCOUNTER — Telehealth: Payer: Self-pay | Admitting: Family Medicine

## 2021-02-26 NOTE — Telephone Encounter (Signed)
Sister aware and verbalizes understanding. Will let us know at a later time if she would like referral.

## 2021-02-26 NOTE — Telephone Encounter (Signed)
Please make sister aware of the following message from neurology: I had Dr. Krista Blue, who had previously seen her at our office review her chart to see if this would be appropriate to see here. Per Dr. Margaretmary Lombard patient's main issue is not seizures and has not had a recurrent seizure within 2 years we would not be able to help patient. We do not see for mental health, mental retardation.   Please ask if she would like a referral to a psychiatrist.

## 2021-03-04 ENCOUNTER — Ambulatory Visit (INDEPENDENT_AMBULATORY_CARE_PROVIDER_SITE_OTHER): Payer: Medicare Other

## 2021-03-04 VITALS — Ht 61.0 in | Wt 171.0 lb

## 2021-03-04 DIAGNOSIS — Z0000 Encounter for general adult medical examination without abnormal findings: Secondary | ICD-10-CM | POA: Diagnosis not present

## 2021-03-04 NOTE — Patient Instructions (Signed)
Pam Sanders , Thank you for taking time to come for your Medicare Wellness Visit. I appreciate your ongoing commitment to your health goals. Please review the following plan we discussed and let me know if I can assist you in the future.   Screening recommendations/referrals: Colonoscopy: Done 12/07/2014 - Repeat in 10 years Mammogram: Done 03/11/2019 - repeat annually - appointment 04/03/21 Bone Density: Done 6/24/222 - Repeat every 2 years Recommended yearly ophthalmology/optometry visit for glaucoma screening and checkup Recommended yearly dental visit for hygiene and checkup  Vaccinations: Influenza vaccine: Done 01/23/2021 - Repeat annually Pneumococcal vaccine: Done 05/01/2016 & 03/22/2020 Tdap vaccine: Done 10/05/2014 - Repeat in 10 years Shingles vaccine: Done 09/20/2020 & 12/05/2020   Covid-19: Done 10/29/2019, 11/26/2019, & 05/08/2020  Advanced directives: in chart  Conditions/risks identified: Aim for 30 minutes of exercise or brisk walking each day, drink 6-8 glasses of water and eat lots of fruits and vegetables.   Next appointment: Follow up in one year for your annual wellness visit    Preventive Care 70 Years and Older, Female Preventive care refers to lifestyle choices and visits with your health care provider that can promote health and wellness. What does preventive care include? A yearly physical exam. This is also called an annual well check. Dental exams once or twice a year. Routine eye exams. Ask your health care provider how often you should have your eyes checked. Personal lifestyle choices, including: Daily care of your teeth and gums. Regular physical activity. Eating a healthy diet. Avoiding tobacco and drug use. Limiting alcohol use. Practicing safe sex. Taking low-dose aspirin every day. Taking vitamin and mineral supplements as recommended by your health care provider. What happens during an annual well check? The services and screenings done by your  health care provider during your annual well check will depend on your age, overall health, lifestyle risk factors, and family history of disease. Counseling  Your health care provider may ask you questions about your: Alcohol use. Tobacco use. Drug use. Emotional well-being. Home and relationship well-being. Sexual activity. Eating habits. History of falls. Memory and ability to understand (cognition). Work and work Statistician. Reproductive health. Screening  You may have the following tests or measurements: Height, weight, and BMI. Blood pressure. Lipid and cholesterol levels. These may be checked every 5 years, or more frequently if you are over 42 years old. Skin check. Lung cancer screening. You may have this screening every year starting at age 78 if you have a 30-pack-year history of smoking and currently smoke or have quit within the past 15 years. Fecal occult blood test (FOBT) of the stool starting at age 70. You may have this test every year starting at age 70. Flexible sigmoidoscopy or colonoscopy. You may have a sigmoidoscopy every 5 years or a colonoscopy every 10 years starting at age 70. Hepatitis C blood test. Hepatitis B blood test. Sexually transmitted disease (STD) testing. Diabetes screening. This is done by checking your blood sugar (glucose) after you have not eaten for a while (fasting). You may have this done every 1-3 years. Bone density scan. This is done to screen for osteoporosis. You may have this done starting at age 70. Mammogram. This may be done every 1-2 years. Talk to your health care provider about how often you should have regular mammograms. Talk with your health care provider about your test results, treatment options, and if necessary, the need for more tests. Vaccines  Your health care provider may recommend certain vaccines, such as: Influenza vaccine. This is  recommended every year. Tetanus, diphtheria, and acellular pertussis (Tdap, Td) vaccine. You may  need a Td booster every 10 years. Zoster vaccine. You may need this after age 71. Pneumococcal 13-valent conjugate (PCV13) vaccine. One dose is recommended after age 5. Pneumococcal polysaccharide (PPSV23) vaccine. One dose is recommended after age 2. Talk to your health care provider about which screenings and vaccines you need and how often you need them. This information is not intended to replace advice given to you by your health care provider. Make sure you discuss any questions you have with your health care provider. Document Released: 04/20/2015 Document Revised: 12/12/2015 Document Reviewed: 01/23/2015 Elsevier Interactive Patient Education  2017 [employer] Prevention in the Home Falls can cause injuries. They can happen to people of all ages. There are many things you can do to make your home safe and to help prevent falls. What can I do on the outside of my home? Regularly fix the edges of walkways and driveways and fix any cracks. Remove anything that might make you trip as you walk through a door, such as a raised step or threshold. Trim any bushes or trees on the path to your home. Use bright outdoor lighting. Clear any walking paths of anything that might make someone trip, such as rocks or tools. Regularly check to see if handrails are loose or broken. Make sure that both sides of any steps have handrails. Any raised decks and porches should have guardrails on the edges. Have any leaves, snow, or ice cleared regularly. Use sand or salt on walking paths during winter. Clean up any spills in your garage right away. This includes oil or grease spills. What can I do in the bathroom? Use night lights. Install grab bars by the toilet and in the tub and shower. Do not use towel bars as grab bars. Use non-skid mats or decals in the tub or shower. If you need to sit down in the shower, use a plastic, non-slip stool. Keep the floor dry. Clean up any water that spills on  the floor as soon as it happens. Remove soap buildup in the tub or shower regularly. Attach bath mats securely with double-sided non-slip rug tape. Do not have throw rugs and other things on the floor that can make you trip. What can I do in the bedroom? Use night lights. Make sure that you have a light by your bed that is easy to reach. Do not use any sheets or blankets that are too big for your bed. They should not hang down onto the floor. Have a firm chair that has side arms. You can use this for support while you get dressed. Do not have throw rugs and other things on the floor that can make you trip. What can I do in the kitchen? Clean up any spills right away. Avoid walking on wet floors. Keep items that you use a lot in easy-to-reach places. If you need to reach something above you, use a strong step stool that has a grab bar. Keep electrical cords out of the way. Do not use floor polish or wax that makes floors slippery. If you must use wax, use non-skid floor wax. Do not have throw rugs and other things on the floor that can make you trip. What can I do with my stairs? Do not leave any items on the stairs. Make sure that there are handrails on both sides of the stairs and use them. Fix handrails that are  broken or loose. Make sure that handrails are as long as the stairways. Check any carpeting to make sure that it is firmly attached to the stairs. Fix any carpet that is loose or worn. Avoid having throw rugs at the top or bottom of the stairs. If you do have throw rugs, attach them to the floor with carpet tape. Make sure that you have a light switch at the top of the stairs and the bottom of the stairs. If you do not have them, ask someone to add them for you. What else can I do to help prevent falls? Wear shoes that: Do not have high heels. Have rubber bottoms. Are comfortable and fit you well. Are closed at the toe. Do not wear sandals. If you use a stepladder: Make sure  that it is fully opened. Do not climb a closed stepladder. Make sure that both sides of the stepladder are locked into place. Ask someone to hold it for you, if possible. Clearly mark and make sure that you can see: Any grab bars or handrails. First and last steps. Where the edge of each step is. Use tools that help you move around (mobility aids) if they are needed. These include: Canes. Walkers. Scooters. Crutches. Turn on the lights when you go into a dark area. Replace any light bulbs as soon as they burn out. Set up your furniture so you have a clear path. Avoid moving your furniture around. If any of your floors are uneven, fix them. If there are any pets around you, be aware of where they are. Review your medicines with your doctor. Some medicines can make you feel dizzy. This can increase your chance of falling. Ask your doctor what other things that you can do to help prevent falls. This information is not intended to replace advice given to you by your health care provider. Make sure you discuss any questions you have with your health care provider. Document Released: 01/18/2009 Document Revised: 08/30/2015 Document Reviewed: 04/28/2014 Elsevier Interactive Patient Education  2017 [employer].

## 2021-03-04 NOTE — Progress Notes (Signed)
Subjective:   Ahri E Geeting is a 70 y.o. female who presents for Medicare Annual (Subsequent) preventive examination.  Virtual Visit via Telephone Note  I connected with  Clementeen Graham on 03/04/21 at 10:30 AM EST by telephone and verified that I am speaking with the correct person using two identifiers.  Location: Patient: Home Provider: [facility] Persons participating in the virtual visit: patient/Nurse Health Advisor   I discussed the limitations, risks, security and privacy concerns of performing an evaluation and management service by telephone and the availability of in person appointments. The patient expressed understanding and agreed to proceed.  Interactive audio and video telecommunications were attempted between this nurse and patient, however failed, due to patient having technical difficulties OR patient did not have access to video capability.  We continued and completed visit with audio only.  Some vital signs may be absent or patient reported.   Nadim Malia E Amedeo Detweiler, LPN   Review of Systems     Cardiac Risk Factors include: advanced age (>70men, >65 women);sedentary lifestyle;obesity (BMI >30kg/m2);dyslipidemia;hypertension     Objective:    Today's Vitals   03/04/21 1037  Weight: 171 lb (77.6 kg)  Height: 5\' 1"  (1.549 m)   Body mass index is 32.31 kg/m.  Advanced Directives 03/04/2021 02/29/2020 02/23/2019 09/30/2018 04/28/2016 04/05/2016 04/05/2016  Does Patient Have a Medical Advance Directive? Yes Yes No No No Yes Yes  Type of Paramedic of [place] Draft;Living will [place]  Does patient want to make changes to medical advance directive? - No - Patient declined - - - No - Patient declined No - Patient declined  Copy of [place] in Chart? Yes - validated most recent copy scanned in chart (See row information) No - copy requested - - - No -  copy requested No - copy requested  Would patient like information on creating a medical advance directive? - - No - Patient declined No - Patient declined - - -    Current Medications (verified) Outpatient Encounter Medications as of 03/04/2021  Medication Sig   alum & mag hydroxide-simeth (MAALOX/MYLANTA) 200-200-20 MG/5ML suspension Take 30 mLs by mouth every 6 (six) hours as needed for indigestion or heartburn.   atorvastatin (LIPITOR) 10 MG tablet Take 1 tablet (10 mg total) by mouth daily.   cetirizine (ZYRTEC) 10 MG tablet Take 10 mg by mouth daily.   Cholecalciferol (VITAMIN D-3) 1000 UNITS CAPS Take 1,000 Units by mouth daily.    Clotrimazole 1 % OINT Apply 1 application topically 2 (two) times daily.   famotidine (PEPCID) 20 MG tablet Take 1 tablet (20 mg total) by mouth at bedtime.   fluticasone (FLONASE) 50 MCG/ACT nasal spray USE 2 SPRAYS INTO EACH NOSTRIL ONCE DAILY AT BEDTIME   furosemide (LASIX) 20 MG tablet Take 1 tablet (20 mg total) by mouth daily.   lamoTRIgine (LAMICTAL) 100 MG tablet TAKE  (1)  TABLET TWICE A DAY.   levothyroxine (SYNTHROID) 137 MCG tablet TAKE 1 TABLET DAILY BEFORE BREAKFAST   LINZESS 72 MCG capsule TAKE 1 CAPSULE DAILY BEFORE BREAKFAST   loratadine (CLARITIN) 10 MG tablet Take 1 tablet (10 mg total) by mouth daily.   losartan (COZAAR) 25 MG tablet TAKE ONE TABLET ONCE DAILY (STOP 50 MG)   Melatonin 5 MG CAPS Take 5 mg by mouth at bedtime.   Multiple Vitamin (MULTIVITAMIN WITH MINERALS) TABS tablet Take 1 tablet by mouth  daily.   Olopatadine HCl 0.2 % SOLN Apply 1 drop to eye daily.   Omega-3 Fatty Acids (FISH OIL) 1000 MG CAPS Take 1 capsule by mouth daily.    omeprazole (PRILOSEC) 20 MG capsule TAKE 1 CAPSULE 2 TIMES A DAY BEFORE A MEAL   potassium chloride SA (KLOR-CON) 20 MEQ tablet TAKE  (1)  TABLET TWICE A DAY.   sertraline (ZOLOFT) 50 MG tablet Take 1 tablet (50 mg total) by mouth daily.   spironolactone (ALDACTONE) 25 MG tablet Take 1 tablet  (25 mg total) by mouth daily.   thioridazine (MELLARIL) 50 MG tablet Take 1 tablet (50 mg total) by mouth at bedtime.   topiramate (TOPAMAX) 100 MG tablet TAKE  (1)  TABLET TWICE A DAY.   No facility-administered encounter medications on file as of 03/04/2021.    Allergies (verified) Patient has no known allergies.   History: Past Medical History:  Diagnosis Date   Gait disturbance    GERD (gastroesophageal reflux disease)    HA (headache)    Hernia of abdominal wall    History of hernia surgery    Hypertension    Lower extremity edema    Osteopenia 09/29/2020   Seizures (HCC)    Thyroid disease    Tremor    Past Surgical History:  Procedure Laterality Date   CATARACT EXTRACTION, BILATERAL     STOMACH SURGERY     VENTRAL HERNIA REPAIR  11/09/2020   Family History  Problem Relation Age of Onset   Stroke Mother    Stroke Father    Stroke Other    Diabetes Other    Heart Problems Other    Thyroid cancer Sister    Hypertension Sister    Emphysema Brother    Other Brother        died at birth   Aneurysm Brother        stomach   Throat cancer Brother    Emphysema Brother    Heart disease Brother    Stroke Brother    Other Brother        MVA   Other Sister        died at birth   Aneurysm Sister        head   Social History   Socioeconomic History   Marital status: Single    Spouse name: Not on file   Number of children: 0   Years of education: HS   Highest education level: 12th grade  Occupational History   Occupation: Disabled  Tobacco Use   Smoking status: Never   Smokeless tobacco: Never  Vaping Use   Vaping Use: Never used  Substance and Sexual Activity   Alcohol use: No   Drug use: No   Sexual activity: Not Currently    Birth control/protection: Post-menopausal  Other Topics Concern   Not on file  Social History Narrative   Patient lives at home alone. Patient has a high school education. Her sister comes in frequently to take care of  medications, groceries. She now has an Aid that that comes in M-F for 16 hours a week that helps her shower and do oral care.   Caffeine - one soda daily.   Social Determinants of Health   Financial Resource Strain: Low Risk    Difficulty of Paying Living Expenses: Not hard at all  Food Insecurity: No Food Insecurity   Worried About Charity fundraiser in the Last Year: Never true   Ran Out of  Food in the Last Year: Never true  Transportation Needs: No Transportation Needs   Lack of Transportation (Medical): No   Lack of Transportation (Non-Medical): No  Physical Activity: Inactive   Days of Exercise per Week: 0 days   Minutes of Exercise per Session: 0 min  Stress: No Stress Concern Present   Feeling of Stress : Not at all  Social Connections: Socially Isolated   Frequency of Communication with Friends and Family: More than three times a week   Frequency of Social Gatherings with Friends and Family: More than three times a week   Attends Religious Services: Never   Marine scientist or Organizations: No   Attends Music therapist: Never   Marital Status: Never married    Tobacco Counseling Counseling given: Not Answered   Clinical Intake:  Pre-visit preparation completed: Yes  Pain : No/denies pain     BMI - recorded: 32.31 Nutritional Status: BMI > 30  Obese Nutritional Risks: None Diabetes: No  How often do you need to have someone help you when you read instructions, pamphlets, or other written materials from your doctor or pharmacy?: 1 - Never  Diabetic? no  Interpreter Needed?: No  Information entered by :: Allan Bacigalupi, LPN   Activities of Daily Living In your present state of health, do you have any difficulty performing the following activities: 03/04/2021  Hearing? Y  Vision? N  Difficulty concentrating or making decisions? Y  Walking or climbing stairs? Y  Dressing or bathing? Y  Doing errands, shopping? Y  Preparing Food and  eating ? N  Using the Toilet? N  In the past six months, have you accidently leaked urine? Y  Comment sometimes  Do you have problems with loss of bowel control? N  Managing your Medications? Y  Managing your Finances? Y  Housekeeping or managing your Housekeeping? Y  Some recent data might be hidden    Patient Care Team: Loman Brooklyn, FNP as PCP - General (Family Medicine) Danie Binder, MD (Inactive) as Consulting Physician (Gastroenterology) Marcial Pacas, MD as Consulting Physician (Neurology)  Indicate any recent Medical Services you may have received from other than [facility] providers in the past year (date may be approximate).     Assessment:   This is a routine wellness examination for Sarajane.  Hearing/Vision screen Hearing Screening - Comments:: Wears hearing aids - moderate hearing loss Vision Screening - Comments:: Wears rx glasses - up to date with annual eye exams with [facility] in [place] issues and exercise activities discussed: Current Exercise Habits: The patient does not participate in regular exercise at present, Exercise limited by: orthopedic condition(s);psychological condition(s);neurologic condition(s)   Goals Addressed             This Visit's Progress    DIET - INCREASE WATER INTAKE   On track    Try to drink 6-8 glasses of water daily     Exercise 3x per week (30 min per time)   Not on track    02/29/2020 AWV Goal: Exercise for General Health  Patient will verbalize understanding of the benefits of increased physical activity: Exercising regularly is important. It will improve your overall fitness, flexibility, and endurance. Regular exercise also will improve your overall health. It can help you control your weight, reduce stress, and improve your bone density. Over the next year, patient will increase physical activity as tolerated with a goal of at least 150 minutes of moderate physical activity  per week.  You can tell that  you are exercising at a moderate intensity if your heart starts beating faster and you start breathing faster but can still hold a conversation. Moderate-intensity exercise ideas include: Walking 1 mile (1.6 km) in about 15 minutes Biking Hiking Golfing Dancing Water aerobics Patient will verbalize understanding of everyday activities that increase physical activity by providing examples like the following: Yard work, such as: Sales promotion account executive Gardening Washing windows or floors Patient will be able to explain general safety guidelines for exercising:  Before you start a new exercise program, talk with your health care provider. Do not exercise so much that you hurt yourself, feel dizzy, or get very short of breath. Wear comfortable clothes and wear shoes with good support. Drink plenty of water while you exercise to prevent dehydration or heat stroke. Work out until your breathing and your heartbeat get faster.        Depression Screen PHQ 2/9 Scores 03/04/2021 02/20/2021 01/23/2021 12/05/2020 09/20/2020 03/22/2020 02/29/2020  PHQ - 2 Score 2 2 0 0 0 0 0  PHQ- 9 Score 11 13 0 0 0 0 -    Fall Risk Fall Risk  03/04/2021 02/20/2021 01/23/2021 09/20/2020 03/22/2020  Falls in the past year? 1 1 0 0 1  Number falls in past yr: 0 0 - - 0  Injury with Fall? 0 0 - - 1  Risk for fall due to : History of fall(s);Impaired balance/gait;Mental status change;Orthopedic patient - - - History of fall(s)  Follow up Education provided;Falls prevention discussed Falls prevention discussed - - Falls evaluation completed    FALL RISK PREVENTION PERTAINING TO THE HOME:  Any stairs in or around the home? No  If so, are there any without handrails? No  Home free of loose throw rugs in walkways, pet beds, electrical cords, etc? Yes  Adequate lighting in your home to reduce risk of falls? Yes   ASSISTIVE DEVICES  UTILIZED TO PREVENT FALLS:  Life alert? Yes  Use of a cane, walker or w/c? Yes  Grab bars in the bathroom? Yes  Shower chair or bench in shower? Yes  Elevated toilet seat or a handicapped toilet? Yes   TIMED UP AND GO:  Was the test performed? No . Telephonic visit  Cognitive Function:     6CIT Screen 03/04/2021 02/29/2020 02/23/2019  What Year? 0 points 0 points 0 points  What month? 0 points 0 points 0 points  What time? 0 points 0 points 0 points  Count back from 20 4 points 4 points 4 points  Months in reverse 4 points 4 points 4 points  Repeat phrase 10 points 10 points 10 points  Total Score 18 18 18     Immunizations Immunization History  Administered Date(s) Administered   Fluad Quad(high Dose 65+) 01/12/2019, 02/14/2020, 01/23/2021   Influenza, High Dose Seasonal PF 01/28/2016, 05/01/2016, 01/23/2017, 01/21/2018   Influenza-Unspecified 02/22/2000, 02/09/2002, 01/26/2003, 02/13/2005, 01/26/2006, 03/09/2014, 01/11/2015, 01/28/2016, 05/01/2016, 01/23/2017, 01/21/2018   Moderna SARS-COV2 Booster Vaccination 05/08/2020   Moderna Sars-Covid-2 Vaccination 10/29/2019, 11/26/2019   Pneumococcal Conjugate-13 05/01/2016   Pneumococcal Polysaccharide-23 10/05/2014, 03/22/2020   Tdap 10/05/2014   Zoster Recombinat (Shingrix) 09/20/2020, 12/05/2020    TDAP status: Up to date  Flu Vaccine status: Up to date  Pneumococcal vaccine status: Up to date  Covid-19 vaccine status: Completed vaccines  Qualifies for Shingles Vaccine? Yes   Zostavax completed Yes  Shingrix Completed?: Yes  Screening Tests Health Maintenance  Topic Date Due   COVID-19 Vaccine (3 - Moderna risk series) 06/05/2020   Fecal DNA (Cologuard)  09/20/2021 (Originally 06/17/2000)   MAMMOGRAM  03/10/2021   DEXA SCAN  09/29/2022   TETANUS/TDAP  10/04/2024   Pneumonia Vaccine 52+ Years old  Completed   INFLUENZA VACCINE  Completed   Hepatitis C Screening  Completed   Zoster Vaccines- Shingrix   Completed   HPV VACCINES  Aged Out    Health Maintenance  Health Maintenance Due  Topic Date Due   COVID-19 Vaccine (3 - Moderna risk series) 06/05/2020    Colorectal cancer screening: Type of screening: Colonoscopy. Completed 12/07/2014. Repeat every 10 years  Mammogram status: Completed 03/11/2019. Repeat every year Has appt 04/03/21  Bone Density status: Completed 09/28/2020. Results reflect: Bone density results: OSTEOPENIA. Repeat every 2 years.  Lung Cancer Screening: (Low Dose CT Chest recommended if Age 54-80 years, 30 pack-year currently smoking OR have quit w/in 15years.) does not qualify.   Additional Screening:  Hepatitis C Screening: does qualify; Completed 07/17/2016  Vision Screening: Recommended annual ophthalmology exams for early detection of glaucoma and other disorders of the eye. Is the patient up to date with their annual eye exam?  Yes  Who is the provider or what is the name of the office in which the patient attends annual eye exams? [place] If pt is not established with a provider, would they like to be referred to a provider to establish care? No .   Dental Screening: Recommended annual dental exams for proper oral hygiene  Community Resource Referral / Chronic Care Management: CRR required this visit?  No   CCM required this visit?  No      Plan:     I have personally reviewed and noted the following in the patient's chart:   Medical and social history Use of alcohol, tobacco or illicit drugs  Current medications and supplements including opioid prescriptions.  Functional ability and status Nutritional status Physical activity Advanced directives List of other physicians Hospitalizations, surgeries, and ER visits in previous 12 months Vitals Screenings to include cognitive, depression, and falls Referrals and appointments  In addition, I have reviewed and discussed with patient certain preventive protocols, quality metrics, and  best practice recommendations. A written personalized care plan for preventive services as well as general preventive health recommendations were provided to patient.     Sandrea Hammond, LPN   68/34/1962   Nurse Notes: None

## 2021-03-21 ENCOUNTER — Ambulatory Visit (INDEPENDENT_AMBULATORY_CARE_PROVIDER_SITE_OTHER): Payer: Medicare Other | Admitting: Family Medicine

## 2021-03-21 ENCOUNTER — Encounter: Payer: Self-pay | Admitting: Family Medicine

## 2021-03-21 VITALS — BP 125/68 | HR 67 | Temp 97.1°F | Ht 61.0 in | Wt 165.8 lb

## 2021-03-21 DIAGNOSIS — K219 Gastro-esophageal reflux disease without esophagitis: Secondary | ICD-10-CM

## 2021-03-21 DIAGNOSIS — I10 Essential (primary) hypertension: Secondary | ICD-10-CM

## 2021-03-21 DIAGNOSIS — Z0001 Encounter for general adult medical examination with abnormal findings: Secondary | ICD-10-CM

## 2021-03-21 DIAGNOSIS — R4689 Other symptoms and signs involving appearance and behavior: Secondary | ICD-10-CM

## 2021-03-21 DIAGNOSIS — E039 Hypothyroidism, unspecified: Secondary | ICD-10-CM

## 2021-03-21 DIAGNOSIS — F259 Schizoaffective disorder, unspecified: Secondary | ICD-10-CM

## 2021-03-21 DIAGNOSIS — G40401 Other generalized epilepsy and epileptic syndromes, not intractable, with status epilepticus: Secondary | ICD-10-CM

## 2021-03-21 DIAGNOSIS — E785 Hyperlipidemia, unspecified: Secondary | ICD-10-CM | POA: Diagnosis not present

## 2021-03-21 DIAGNOSIS — I739 Peripheral vascular disease, unspecified: Secondary | ICD-10-CM | POA: Diagnosis not present

## 2021-03-21 DIAGNOSIS — F79 Unspecified intellectual disabilities: Secondary | ICD-10-CM

## 2021-03-21 DIAGNOSIS — Z1211 Encounter for screening for malignant neoplasm of colon: Secondary | ICD-10-CM

## 2021-03-21 DIAGNOSIS — Z0000 Encounter for general adult medical examination without abnormal findings: Secondary | ICD-10-CM

## 2021-03-21 DIAGNOSIS — M8580 Other specified disorders of bone density and structure, unspecified site: Secondary | ICD-10-CM

## 2021-03-21 MED ORDER — THIORIDAZINE HCL 50 MG PO TABS
50.0000 mg | ORAL_TABLET | Freq: Every day | ORAL | 1 refills | Status: DC
Start: 2021-03-21 — End: 2021-09-23

## 2021-03-21 MED ORDER — FUROSEMIDE 20 MG PO TABS
20.0000 mg | ORAL_TABLET | Freq: Every day | ORAL | 1 refills | Status: DC
Start: 2021-03-21 — End: 2021-09-23

## 2021-03-21 MED ORDER — SERTRALINE HCL 50 MG PO TABS: 50.0000 mg | ORAL_TABLET | Freq: Every day | ORAL | 1 refills | Status: DC

## 2021-03-21 MED ORDER — FAMOTIDINE 20 MG PO TABS: 20.0000 mg | ORAL_TABLET | Freq: Every day | ORAL | 1 refills | Status: DC

## 2021-03-21 MED ORDER — POTASSIUM CHLORIDE CRYS ER 20 MEQ PO TBCR: 20.0000 meq | EXTENDED_RELEASE_TABLET | Freq: Two times a day (BID) | ORAL | 1 refills | Status: DC

## 2021-03-21 MED ORDER — SPIRONOLACTONE 25 MG PO TABS: 25.0000 mg | ORAL_TABLET | Freq: Every day | ORAL | 1 refills | Status: DC

## 2021-03-21 MED ORDER — LOSARTAN POTASSIUM 25 MG PO TABS: 25.0000 mg | ORAL_TABLET | Freq: Every day | ORAL | 1 refills | Status: DC
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
## 2021-03-22 ENCOUNTER — Other Ambulatory Visit: Payer: Self-pay | Admitting: Family Medicine

## 2021-03-22 ENCOUNTER — Telehealth: Payer: Self-pay | Admitting: Family Medicine

## 2021-03-22 DIAGNOSIS — I739 Peripheral vascular disease, unspecified: Secondary | ICD-10-CM

## 2021-03-22 DIAGNOSIS — E785 Hyperlipidemia, unspecified: Secondary | ICD-10-CM

## 2021-03-22 DIAGNOSIS — E039 Hypothyroidism, unspecified: Secondary | ICD-10-CM

## 2021-03-22 LAB — CBC WITH DIFFERENTIAL/PLATELET
Basophils Absolute: 0.1 10*3/uL (ref 0.0–0.2)
Basos: 1 %
EOS (ABSOLUTE): 0.1 10*3/uL (ref 0.0–0.4)
Eos: 1 %
Hematocrit: 35.1 % (ref 34.0–46.6)
Hemoglobin: 12.6 g/dL (ref 11.1–15.9)
Immature Grans (Abs): 0 10*3/uL (ref 0.0–0.1)
Immature Granulocytes: 0 %
Lymphocytes Absolute: 2.1 10*3/uL (ref 0.7–3.1)
Lymphs: 26 %
MCH: 33.4 pg — ABNORMAL HIGH (ref 26.6–33.0)
MCHC: 35.9 g/dL — ABNORMAL HIGH (ref 31.5–35.7)
MCV: 93 fL (ref 79–97)
Monocytes Absolute: 0.9 10*3/uL (ref 0.1–0.9)
Monocytes: 10 %
Neutrophils Absolute: 5 10*3/uL (ref 1.4–7.0)
Neutrophils: 62 %
Platelets: 215 10*3/uL (ref 150–450)
RBC: 3.77 x10E6/uL (ref 3.77–5.28)
RDW: 13.3 % (ref 11.7–15.4)
WBC: 8.2 10*3/uL (ref 3.4–10.8)

## 2021-03-22 LAB — CMP14+EGFR
ALT: 12 IU/L (ref 0–32)
AST: 24 IU/L (ref 0–40)
Albumin/Globulin Ratio: 2 (ref 1.2–2.2)
Albumin: 4.7 g/dL (ref 3.8–4.8)
Alkaline Phosphatase: 85 IU/L (ref 44–121)
BUN/Creatinine Ratio: 9 — ABNORMAL LOW (ref 12–28)
BUN: 11 mg/dL (ref 8–27)
Bilirubin Total: 0.3 mg/dL (ref 0.0–1.2)
CO2: 26 mmol/L (ref 20–29)
Calcium: 10.2 mg/dL (ref 8.7–10.3)
Chloride: 96 mmol/L (ref 96–106)
Creatinine, Ser: 1.28 mg/dL — ABNORMAL HIGH (ref 0.57–1.00)
Globulin, Total: 2.4 g/dL (ref 1.5–4.5)
Glucose: 88 mg/dL (ref 70–99)
Potassium: 4.3 mmol/L (ref 3.5–5.2)
Sodium: 134 mmol/L (ref 134–144)
Total Protein: 7.1 g/dL (ref 6.0–8.5)
eGFR: 45 mL/min/{1.73_m2} — ABNORMAL LOW (ref >59)

## 2021-03-22 LAB — LIPID PANEL
Chol/HDL Ratio: 2.5 ratio (ref 0.0–4.4)
Cholesterol, Total: 136 mg/dL (ref 100–199)
HDL: 55 mg/dL (ref >39)
LDL Chol Calc (NIH): 66 mg/dL (ref 0–99)
Triglycerides: 79 mg/dL (ref 0–149)
VLDL Cholesterol Cal: 15 mg/dL (ref 5–40)

## 2021-03-22 LAB — T4, FREE: Free T4: 1.15 ng/dL (ref 0.82–1.77)

## 2021-03-22 LAB — TSH: TSH: 8.47 u[IU]/mL — ABNORMAL HIGH (ref 0.450–4.500)

## 2021-03-22 NOTE — Telephone Encounter (Signed)
Left message for sister on cell and home number to call back

## 2021-03-24 NOTE — Telephone Encounter (Signed)
Please let sister, [name], know I increase the dose from 137 mcg to 150 mcg since her TSH is elevated. She will need to return in 6-8 weeks for repeat lab work to make sure this is improving. I know I originally told them 6 months, but that was before this medication change.

## 2021-03-25 NOTE — Telephone Encounter (Signed)
[name] aware

## 2021-04-03 ENCOUNTER — Ambulatory Visit
Admission: RE | Admit: 2021-04-03 | Discharge: 2021-04-03 | Disposition: A | Discharge status: Home / Self Care | Payer: Medicare Other | Source: Ambulatory Visit | Attending: Family Medicine | Admitting: Family Medicine

## 2021-04-03 DIAGNOSIS — Z1231 Encounter for screening mammogram for malignant neoplasm of breast: Secondary | ICD-10-CM

## 2021-05-22 ENCOUNTER — Other Ambulatory Visit: Payer: Self-pay | Admitting: Family Medicine

## 2021-05-22 DIAGNOSIS — J309 Allergic rhinitis, unspecified: Secondary | ICD-10-CM

## 2021-06-20 ENCOUNTER — Other Ambulatory Visit: Payer: Self-pay | Admitting: Family Medicine

## 2021-06-20 ENCOUNTER — Other Ambulatory Visit: Payer: Self-pay | Admitting: Gastroenterology

## 2021-06-20 NOTE — Telephone Encounter (Signed)
Last office visit 01/05/2019

## 2021-06-21 ENCOUNTER — Other Ambulatory Visit: Payer: Self-pay | Admitting: Family Medicine

## 2021-06-21 DIAGNOSIS — J309 Allergic rhinitis, unspecified: Secondary | ICD-10-CM

## 2021-06-24 LAB — COLOGUARD

## 2021-07-08 ENCOUNTER — Telehealth: Payer: Self-pay | Admitting: Family Medicine

## 2021-07-08 NOTE — Telephone Encounter (Signed)
Appointment scheduled.

## 2021-07-08 NOTE — Telephone Encounter (Signed)
?  Prescription Request ? ?07/08/2021 ? ?Is this a "Controlled Substance" medicine? no ? ?Have you seen your PCP in the last 2 weeks? Not sure ? ?If YES, route message to pool  -  If NO, patient needs to be scheduled for appointment. ? ?What is the name of the medication or equipment? RX for walker with a seat (Her brakes is broke on her old walker) ? ?Have you contacted your pharmacy to request a refill? yes  ? ?Which pharmacy would you like this sent to? [facility] ? ? ?Patient notified that their request is being sent to the clinical staff for review and that they should receive a response within 2 business days.  ? ?Pam Sanders's pt. ? ?Please call sister Pam Sanders) ? ?Fax# [phone] ?  ?

## 2021-07-24 ENCOUNTER — Ambulatory Visit (INDEPENDENT_AMBULATORY_CARE_PROVIDER_SITE_OTHER): Payer: Medicare Other | Admitting: Family Medicine

## 2021-07-24 ENCOUNTER — Encounter: Payer: Self-pay | Admitting: Family Medicine

## 2021-07-24 VITALS — BP 103/63 | HR 73 | Temp 97.8°F | Ht 61.0 in | Wt 167.6 lb

## 2021-07-24 DIAGNOSIS — F259 Schizoaffective disorder, unspecified: Secondary | ICD-10-CM

## 2021-07-24 DIAGNOSIS — R269 Unspecified abnormalities of gait and mobility: Secondary | ICD-10-CM

## 2021-07-24 DIAGNOSIS — R4689 Other symptoms and signs involving appearance and behavior: Secondary | ICD-10-CM | POA: Diagnosis not present
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
## 2021-08-05 DIAGNOSIS — N1832 Chronic kidney disease, stage 3b: Secondary | ICD-10-CM | POA: Diagnosis not present

## 2021-08-05 DIAGNOSIS — I129 Hypertensive chronic kidney disease with stage 1 through stage 4 chronic kidney disease, or unspecified chronic kidney disease: Secondary | ICD-10-CM | POA: Diagnosis not present

## 2021-08-19 ENCOUNTER — Other Ambulatory Visit: Payer: Self-pay | Admitting: Family Medicine

## 2021-08-19 DIAGNOSIS — J309 Allergic rhinitis, unspecified: Secondary | ICD-10-CM

## 2021-08-26 ENCOUNTER — Other Ambulatory Visit: Payer: Self-pay | Admitting: Family Medicine

## 2021-08-26 DIAGNOSIS — E785 Hyperlipidemia, unspecified: Secondary | ICD-10-CM

## 2021-08-26 DIAGNOSIS — I739 Peripheral vascular disease, unspecified: Secondary | ICD-10-CM

## 2021-09-18 ENCOUNTER — Other Ambulatory Visit: Payer: Self-pay | Admitting: Family Medicine

## 2021-09-18 DIAGNOSIS — E039 Hypothyroidism, unspecified: Secondary | ICD-10-CM

## 2021-09-18 DIAGNOSIS — G40401 Other generalized epilepsy and epileptic syndromes, not intractable, with status epilepticus: Secondary | ICD-10-CM

## 2021-09-19 ENCOUNTER — Ambulatory Visit: Payer: Medicare Other | Admitting: Family Medicine

## 2021-09-20 ENCOUNTER — Other Ambulatory Visit: Payer: Self-pay | Admitting: Family Medicine

## 2021-09-20 DIAGNOSIS — I10 Essential (primary) hypertension: Secondary | ICD-10-CM

## 2021-09-20 DIAGNOSIS — F259 Schizoaffective disorder, unspecified: Secondary | ICD-10-CM

## 2021-09-20 DIAGNOSIS — K219 Gastro-esophageal reflux disease without esophagitis: Secondary | ICD-10-CM

## 2021-10-01 ENCOUNTER — Encounter: Payer: Self-pay | Admitting: Family Medicine

## 2021-10-01 ENCOUNTER — Ambulatory Visit (INDEPENDENT_AMBULATORY_CARE_PROVIDER_SITE_OTHER): Payer: Medicare Other | Admitting: Family Medicine

## 2021-10-01 ENCOUNTER — Ambulatory Visit: Payer: Medicare Other | Admitting: Family Medicine

## 2021-10-01 VITALS — BP 105/58 | HR 62 | Temp 97.7°F | Ht 61.0 in | Wt 173.0 lb

## 2021-10-01 DIAGNOSIS — I10 Essential (primary) hypertension: Secondary | ICD-10-CM | POA: Diagnosis not present

## 2021-10-01 DIAGNOSIS — E669 Obesity, unspecified: Secondary | ICD-10-CM

## 2021-10-01 DIAGNOSIS — F259 Schizoaffective disorder, unspecified: Secondary | ICD-10-CM

## 2021-10-01 DIAGNOSIS — M8589 Other specified disorders of bone density and structure, multiple sites: Secondary | ICD-10-CM

## 2021-10-01 DIAGNOSIS — I739 Peripheral vascular disease, unspecified: Secondary | ICD-10-CM

## 2021-10-01 DIAGNOSIS — Z87898 Personal history of other specified conditions: Secondary | ICD-10-CM

## 2021-10-01 DIAGNOSIS — E785 Hyperlipidemia, unspecified: Secondary | ICD-10-CM | POA: Diagnosis not present

## 2021-10-01 DIAGNOSIS — K219 Gastro-esophageal reflux disease without esophagitis: Secondary | ICD-10-CM | POA: Diagnosis not present

## 2021-10-01 DIAGNOSIS — E039 Hypothyroidism, unspecified: Secondary | ICD-10-CM | POA: Diagnosis not present

## 2021-10-01 DIAGNOSIS — N1832 Chronic kidney disease, stage 3b: Secondary | ICD-10-CM | POA: Diagnosis not present

## 2021-10-01 MED ORDER — LEVOTHYROXINE SODIUM 150 MCG PO TABS: 150.0000 ug | ORAL_TABLET | Freq: Every day | ORAL | 1 refills | Status: DC

## 2021-10-01 MED ORDER — ATORVASTATIN CALCIUM 10 MG PO TABS: 10.0000 mg | ORAL_TABLET | Freq: Every day | ORAL | 1 refills | Status: DC

## 2021-10-02 LAB — CBC WITH DIFFERENTIAL/PLATELET
Basophils Absolute: 0 10*3/uL (ref 0.0–0.2)
Basos: 1 %
EOS (ABSOLUTE): 0.1 10*3/uL (ref 0.0–0.4)
Eos: 2 %
Hematocrit: 34.4 % (ref 34.0–46.6)
Hemoglobin: 11.4 g/dL (ref 11.1–15.9)
Immature Grans (Abs): 0 10*3/uL (ref 0.0–0.1)
Immature Granulocytes: 0 %
Lymphocytes Absolute: 1.9 10*3/uL (ref 0.7–3.1)
Lymphs: 36 %
MCH: 33.1 pg — ABNORMAL HIGH (ref 26.6–33.0)
MCHC: 33.1 g/dL (ref 31.5–35.7)
MCV: 100 fL — ABNORMAL HIGH (ref 79–97)
Monocytes Absolute: 0.6 10*3/uL (ref 0.1–0.9)
Monocytes: 11 %
Neutrophils Absolute: 2.7 10*3/uL (ref 1.4–7.0)
Neutrophils: 50 %
Platelets: 168 10*3/uL (ref 150–450)
RBC: 3.44 x10E6/uL — ABNORMAL LOW (ref 3.77–5.28)
RDW: 12 % (ref 11.7–15.4)
WBC: 5.4 10*3/uL (ref 3.4–10.8)

## 2021-10-02 LAB — CMP14+EGFR
ALT: 12 IU/L (ref 0–32)
AST: 23 IU/L (ref 0–40)
Albumin/Globulin Ratio: 1.5 (ref 1.2–2.2)
Albumin: 4.3 g/dL (ref 3.7–4.7)
Alkaline Phosphatase: 68 IU/L (ref 44–121)
BUN/Creatinine Ratio: 12 (ref 12–28)
BUN: 18 mg/dL (ref 8–27)
Bilirubin Total: <0.2 mg/dL (ref 0.0–1.2)
CO2: 22 mmol/L (ref 20–29)
Calcium: 9.4 mg/dL (ref 8.7–10.3)
Chloride: 105 mmol/L (ref 96–106)
Creatinine, Ser: 1.53 mg/dL — ABNORMAL HIGH (ref 0.57–1.00)
Globulin, Total: 2.9 g/dL (ref 1.5–4.5)
Glucose: 90 mg/dL (ref 70–99)
Potassium: 4.2 mmol/L (ref 3.5–5.2)
Sodium: 141 mmol/L (ref 134–144)
Total Protein: 7.2 g/dL (ref 6.0–8.5)
eGFR: 36 mL/min/{1.73_m2} — ABNORMAL LOW (ref >59)

## 2021-10-02 LAB — T4, FREE: Free T4: 1.31 ng/dL (ref 0.82–1.77)

## 2021-10-02 LAB — TSH: TSH: 2.41 u[IU]/mL (ref 0.450–4.500)

## 2021-11-20 ENCOUNTER — Encounter: Payer: Self-pay | Admitting: *Deleted

## 2021-11-28 ENCOUNTER — Ambulatory Visit (INDEPENDENT_AMBULATORY_CARE_PROVIDER_SITE_OTHER): Payer: Medicare Other | Admitting: Neurology

## 2021-11-28 ENCOUNTER — Encounter: Payer: Self-pay | Admitting: Neurology

## 2021-11-28 VITALS — BP 104/55 | HR 61 | Ht 61.0 in | Wt 176.0 lb

## 2021-11-28 DIAGNOSIS — I10 Essential (primary) hypertension: Secondary | ICD-10-CM | POA: Diagnosis not present

## 2021-11-28 DIAGNOSIS — E559 Vitamin D deficiency, unspecified: Secondary | ICD-10-CM | POA: Diagnosis not present

## 2021-11-28 DIAGNOSIS — R451 Restlessness and agitation: Secondary | ICD-10-CM | POA: Diagnosis not present

## 2021-11-28 DIAGNOSIS — G40401 Other generalized epilepsy and epileptic syndromes, not intractable, with status epilepticus: Secondary | ICD-10-CM

## 2021-11-28 MED ORDER — LAMOTRIGINE 100 MG PO TABS: 100.0000 mg | ORAL_TABLET | Freq: Two times a day (BID) | ORAL | 3 refills | Status: DC

## 2021-11-28 MED ORDER — TOPIRAMATE 100 MG PO TABS: 100.0000 mg | ORAL_TABLET | Freq: Two times a day (BID) | ORAL | 3 refills | Status: DC

## 2021-11-28 MED ORDER — LAMOTRIGINE 100 MG PO TABS: ORAL_TABLET | ORAL | 3 refills | Status: DC

## 2021-11-28 NOTE — Progress Notes (Signed)
Chief Complaint  Patient presents with   New Patient (Initial Visit)    Rm 15. Alone. NX Pam Sanders LV 2018/internal referral for seizures.      ASSESSMENT AND PLAN  Pam Sanders is a 71 y.o. female   History of epilepsy  Last reported seizure was around 2010  History of neuropsychiatric disease, worsening agitations  On polypharmacy treatment,  Worsening memory loss, agitation, CT head showed old left occipital infarction, ventriculomegaly especially at the left lateral ventricle  Will simplify her medications, taper off Topamax, titrating lamotrigine to 100/200  Laboratory evaluation  EEG  Return to clinic in 1 year with nurse practitioner  DIAGNOSTIC DATA (LABS, IMAGING, TESTING) - I reviewed patient records, labs, notes, testing and imaging myself where available. CT head on 11/01/2019:  Old LEFT occipital infarct with ex vacuo dilatation of the occipital horn of the LEFT lateral ventricle.  Lab in June 2023: normal TSH, free T4, CMP, creat 1.53, Hg 11.4   MEDICAL HISTORY:  Pam Sanders is a 71 year old female, accompanied by her sister at the visit, seen in request by   Pam Sanders, for evaluation of seizure, she is accompanied by her [place]on Palm at today's visit November 28, 2021  I reviewed and summarized the referring note.  Past medical history Hard of hearing Lipidemia Hypothyroidism Hypertension Epilepsy Schizophrenia  I saw Pam Sanders with her Sister Pam Sanders in the past, most recent visit was in 2018,  Pam Sanders lives in apartment by herself, have assistant coming 5 hours each day for 5 days, Sister Pam Sanders check on her frequently, has her power of attorney.  She carried a diagnosis of schizophrenia, has been on disability for long time  Since 2016, she had steady decline in her daily function, require more help,  She had intermittent seizure, but has not had recurrent seizure for long time, last reported seizure was around 2010, she was on Topamax, Depakote  ER, later she was switched to stable dose of lamotrigine 100 mg twice a day, remained on Topamax 100 mg twice a day, lost to follow-up since 2018  She had no recurrent seizure, worsening hard of hearing, very agitated, Sister Pam Sanders report that over the past few years she has become more difficult to to communicate, tried to be independent, but ultrasound messed up her medications, now and is has power of attorney, but tends to have resistance from [place].  PHYSICAL EXAM:   Vitals:   11/28/21 1425  BP: (!) 104/55  Pulse: 61  Weight: 176 lb (79.8 kg)  Height: 5\' 1"  (1.549 m)   Not recorded     Body mass index is 33.25 kg/m.  PHYSICAL EXAMNIATION:  Gen: NAD, conversant, well nourised, well groomed                     Cardiovascular: Regular rate rhythm, no peripheral edema, warm, nontender. Eyes: Conjunctivae clear without exudates or hemorrhage Neck: Supple, no carotid bruits. Pulmonary: Clear to auscultation bilaterally   NEUROLOGICAL EXAM:  MENTAL STATUS: Speech/cognition: Hard of hearing, mild persistent can provide history CRANIAL NERVES: CN II: Visual fields are full to confrontation. Pupils are round equal and briskly reactive to light. CN III, IV, VI: extraocular movement are normal. No ptosis. CN V: Facial sensation is intact to light touch CN VII: Face is symmetric with normal eye closure  CN VIII: Hearing is normal to causal conversation. CN IX, X: Phonation is normal. CN XI: Head turning and shoulder shrug are  intact  MOTOR: Using 4 extremities without difficulty  REFLEXES: Reflexes are present and symmetric   SENSORY: Withdraw to pain COORDINATION: There is no trunk or limb dysmetria noted.  GAIT/STANCE: Rely on her walker to get up from seated position REVIEW OF SYSTEMS:  Full 14 system review of systems performed and notable only for as above All other review of systems were negative.   ALLERGIES: No Known Allergies  HOME MEDICATIONS: Current  Outpatient Medications  Medication Sig Dispense Refill   alum & mag hydroxide-simeth (MAALOX/MYLANTA) 200-200-20 MG/5ML suspension Take 30 mLs by mouth every 6 (six) hours as needed for indigestion or heartburn.     atorvastatin (LIPITOR) 10 MG tablet Take 1 tablet (10 mg total) by mouth daily. 90 tablet 1   calcium carbonate (OSCAL) 1500 (600 Ca) MG TABS tablet Take 1,200 mg by mouth daily.     cetirizine (ZYRTEC) 10 MG tablet TAKE 1 TABLET DAILY FOR ALLERGY SYMPTOMS 90 tablet 1   Cholecalciferol (VITAMIN D-3) 1000 UNITS CAPS Take 1,000 Units by mouth daily.      Clotrimazole 1 % OINT Apply 1 application topically 2 (two) times daily. 56.7 g 0   famotidine (PEPCID) 20 MG tablet TAKE 1 TABLET AT BEDTIME 90 tablet 1   fluticasone (FLONASE) 50 MCG/ACT nasal spray USE 2 SPRAYS INTO EACH NOSTRIL ONCE DAILY AT BEDTIME 16 g 2   furosemide (LASIX) 20 MG tablet TAKE ONE TABLET DAILY 90 tablet 1   lamoTRIgine (LAMICTAL) 100 MG tablet TAKE (1) TABLET TWICE A DAY. 180 tablet 0   levothyroxine (SYNTHROID) 150 MCG tablet Take 1 tablet (150 mcg total) by mouth daily before breakfast. 90 tablet 1   LINZESS 72 MCG capsule TAKE 1 CAPSULE DAILY BEFORE BREAKFAST 30 capsule 11   loratadine (CLARITIN) 10 MG tablet Take 1 tablet (10 mg total) by mouth daily. 90 tablet 1   losartan (COZAAR) 25 MG tablet TAKE 1 TABLET DAILY 90 tablet 1   Melatonin 5 MG CAPS Take 5 mg by mouth at bedtime.     Multiple Vitamin (MULTIVITAMIN WITH MINERALS) TABS tablet Take 1 tablet by mouth daily.     Olopatadine HCl 0.2 % SOLN Apply 1 drop to eye daily. 2.5 mL 0   Omega-3 Fatty Acids (FISH OIL) 1000 MG CAPS Take 1 capsule by mouth daily.      omeprazole (PRILOSEC) 20 MG capsule TAKE 1 CAPSULE 2 TIMES A DAY BEFORE A MEAL 180 capsule 1   potassium chloride SA (KLOR-CON M) 20 MEQ tablet TAKE 1 TABLET 2 TIMES A DAY 180 tablet 1   sertraline (ZOLOFT) 50 MG tablet TAKE 1 TABLET DAILY 90 tablet 1   spironolactone (ALDACTONE) 25 MG tablet TAKE  1 TABLET DAILY 90 tablet 1   thioridazine (MELLARIL) 50 MG tablet TAKE 1 TABLET AT BEDTIME 90 tablet 1   topiramate (TOPAMAX) 100 MG tablet TAKE (1) TABLET TWICE A DAY. 180 tablet 0   No current facility-administered medications for this visit.    PAST MEDICAL HISTORY: Past Medical History:  Diagnosis Date   Gait disturbance    GERD (gastroesophageal reflux disease)    HA (headache)    Hernia of abdominal wall    History of hernia surgery    Hypertension    Lower extremity edema    Osteopenia 09/29/2020   Seizures (HCC)    Thyroid disease    Tremor     PAST SURGICAL HISTORY: Past Surgical History:  Procedure Laterality Date   CATARACT EXTRACTION, BILATERAL  STOMACH SURGERY     VENTRAL HERNIA REPAIR  11/09/2020    FAMILY HISTORY: Family History  Problem Relation Age of Onset   Stroke Mother    Stroke Father    Stroke Other    Diabetes Other    Heart Problems Other    Thyroid cancer Sister    Hypertension Sister    Emphysema Brother    Other Brother        died at birth   Aneurysm Brother        stomach   Throat cancer Brother    Emphysema Brother    Heart disease Brother    Stroke Brother    Other Brother        MVA   Other Sister        died at birth   Aneurysm Sister        head    SOCIAL HISTORY: Social History   Socioeconomic History   Marital status: Single    Spouse name: Not on file   Number of children: 0   Years of education: HS   Highest education level: 12th grade  Occupational History   Occupation: Disabled  Tobacco Use   Smoking status: Never   Smokeless tobacco: Never  Vaping Use   Vaping Use: Never used  Substance and Sexual Activity   Alcohol use: No   Drug use: No   Sexual activity: Not Currently    Birth control/protection: Post-menopausal  Other Topics Concern   Not on file  Social History Narrative   Patient lives at home alone. Patient has a high school education. Her sister comes in frequently to take care of  medications, groceries. She now has an Aid that that comes in M-F for 16 hours a week that helps her shower and do oral care.   Caffeine - one soda daily.   Social Determinants of Health   Financial Resource Strain: Low Risk  (03/04/2021)   Overall Financial Resource Strain (CARDIA)    Difficulty of Paying Living Expenses: Not hard at all  Food Insecurity: No Food Insecurity (03/04/2021)   Hunger Vital Sign    Worried About Running Out of Food in the Last Year: Never true    Ran Out of Food in the Last Year: Never true  Transportation Needs: No Transportation Needs (03/04/2021)   PRAPARE - Hydrologist (Medical): No    Lack of Transportation (Non-Medical): No  Physical Activity: Inactive (03/04/2021)   Exercise Vital Sign    Days of Exercise per Week: 0 days    Minutes of Exercise per Session: 0 min  Stress: No Stress Concern Present (03/04/2021)   [place]    Feeling of Stress : Not at all  Social Connections: Socially Isolated (03/04/2021)   Social Connection and Isolation Panel [NHANES]    Frequency of Communication with Friends and Family: More than three times a week    Frequency of Social Gatherings with Friends and Family: More than three times a week    Attends Religious Services: Never    Marine scientist or Organizations: No    Attends Archivist Meetings: Never    Marital Status: Never married  Intimate Partner Violence: Not At Risk (03/04/2021)   Humiliation, Afraid, Rape, and Kick questionnaire    Fear of Current or Ex-Partner: No    Emotionally Abused: No    Physically Abused: No    Sexually  Abused: No      Marcial Pacas, M.D. Ph.D.  [facility] [address] [place], [place] [address] Ph: [phone] Fax: [phone]  CC:  Pam Sanders, [place],  [place] [address]  Pam Brooklyn, FNP

## 2021-11-29 LAB — VITAMIN B12: Vitamin B-12: 1326 pg/mL — ABNORMAL HIGH (ref 232–1245)

## 2021-11-29 LAB — TOPIRAMATE LEVEL: Topiramate Lvl: 4.6 ug/mL (ref 2.0–25.0)

## 2021-11-29 LAB — VITAMIN D 25 HYDROXY (VIT D DEFICIENCY, FRACTURES): Vit D, 25-Hydroxy: 65.6 ng/mL (ref 30.0–100.0)

## 2021-11-29 LAB — LAMOTRIGINE LEVEL: Lamotrigine Lvl: 2.8 ug/mL (ref 2.0–20.0)

## 2021-12-04 ENCOUNTER — Ambulatory Visit (INDEPENDENT_AMBULATORY_CARE_PROVIDER_SITE_OTHER): Payer: Medicare Other | Admitting: Neurology

## 2021-12-04 DIAGNOSIS — G40401 Other generalized epilepsy and epileptic syndromes, not intractable, with status epilepticus: Secondary | ICD-10-CM | POA: Diagnosis not present

## 2021-12-17 NOTE — Procedures (Signed)
   HISTORY: 71 year old female history of epilepsy, psychological disorder, increased agitation  TECHNIQUE:  This is a routine 16 channel EEG recording with one channel devoted to a limited EKG recording.  It was performed during wakefulness, drowsiness and asleep.  Hyperventilation and photic stimulation were performed as activating procedures.  There are frequent movement, muscle artifact.  Upon maximum arousal, posterior dominant waking rhythm consistent of mildly dysrhythmic theta range activity. Activities are symmetric over the bilateral posterior derivations and attenuated with eye opening.  Hyperventilation produced mild/moderate buildup with higher amplitude and the slower activities noted.  Photic stimulation did not alter the tracing.  During EEG recording, patient developed drowsiness and no deeper stage of sleep was achieved During EEG recording, there was no epileptiform discharge noted.  EKG demonstrate sinus rhythm, with heart rate of 56 bpm  CONCLUSION: This is a technical suboptimal EEG due to patient's poor relaxation, with frequent muscle and movement artifact.  There is evidence of mild background slowing, indicating mild bihemispheric malfunction.  Marcial Pacas, M.D. Ph.D.  [facility] [place],  [address] Phone: [phone] Fax:      [phone]

## 2021-12-18 ENCOUNTER — Other Ambulatory Visit: Payer: Self-pay | Admitting: Family Medicine

## 2021-12-18 DIAGNOSIS — K219 Gastro-esophageal reflux disease without esophagitis: Secondary | ICD-10-CM

## 2021-12-18 DIAGNOSIS — G40401 Other generalized epilepsy and epileptic syndromes, not intractable, with status epilepticus: Secondary | ICD-10-CM

## 2021-12-18 DIAGNOSIS — I739 Peripheral vascular disease, unspecified: Secondary | ICD-10-CM

## 2021-12-18 DIAGNOSIS — K5900 Constipation, unspecified: Secondary | ICD-10-CM

## 2021-12-18 DIAGNOSIS — E785 Hyperlipidemia, unspecified: Secondary | ICD-10-CM

## 2021-12-26 DIAGNOSIS — I10 Essential (primary) hypertension: Secondary | ICD-10-CM | POA: Diagnosis not present

## 2021-12-27 ENCOUNTER — Other Ambulatory Visit: Payer: Self-pay | Admitting: Family Medicine

## 2022-01-24 DIAGNOSIS — I10 Essential (primary) hypertension: Secondary | ICD-10-CM | POA: Diagnosis not present

## 2022-02-21 DIAGNOSIS — I10 Essential (primary) hypertension: Secondary | ICD-10-CM | POA: Diagnosis not present

## 2022-03-14 DIAGNOSIS — I10 Essential (primary) hypertension: Secondary | ICD-10-CM | POA: Diagnosis not present

## 2022-04-03 ENCOUNTER — Encounter: Payer: Self-pay | Admitting: Family Medicine

## 2022-04-03 ENCOUNTER — Ambulatory Visit (INDEPENDENT_AMBULATORY_CARE_PROVIDER_SITE_OTHER): Payer: Medicare Other | Admitting: Family Medicine

## 2022-04-03 VITALS — BP 108/56 | HR 47 | Temp 98.2°F | Ht 61.0 in | Wt 181.8 lb

## 2022-04-03 DIAGNOSIS — R9431 Abnormal electrocardiogram [ECG] [EKG]: Secondary | ICD-10-CM | POA: Diagnosis not present

## 2022-04-03 DIAGNOSIS — R001 Bradycardia, unspecified: Secondary | ICD-10-CM

## 2022-04-03 DIAGNOSIS — R0902 Hypoxemia: Secondary | ICD-10-CM | POA: Diagnosis not present

## 2022-04-03 DIAGNOSIS — I10 Essential (primary) hypertension: Secondary | ICD-10-CM

## 2022-04-03 DIAGNOSIS — Z79899 Other long term (current) drug therapy: Secondary | ICD-10-CM | POA: Diagnosis not present

## 2022-04-03 DIAGNOSIS — R079 Chest pain, unspecified: Secondary | ICD-10-CM | POA: Diagnosis not present

## 2022-04-03 DIAGNOSIS — F259 Schizoaffective disorder, unspecified: Secondary | ICD-10-CM

## 2022-04-03 DIAGNOSIS — F79 Unspecified intellectual disabilities: Secondary | ICD-10-CM

## 2022-04-03 DIAGNOSIS — I499 Cardiac arrhythmia, unspecified: Secondary | ICD-10-CM | POA: Diagnosis not present

## 2022-04-03 DIAGNOSIS — R0602 Shortness of breath: Secondary | ICD-10-CM

## 2022-04-03 DIAGNOSIS — G40909 Epilepsy, unspecified, not intractable, without status epilepticus: Secondary | ICD-10-CM

## 2022-04-03 DIAGNOSIS — Z743 Need for continuous supervision: Secondary | ICD-10-CM | POA: Diagnosis not present

## 2022-04-03 DIAGNOSIS — R000 Tachycardia, unspecified: Secondary | ICD-10-CM | POA: Diagnosis not present

## 2022-04-03 DIAGNOSIS — R6889 Other general symptoms and signs: Secondary | ICD-10-CM | POA: Diagnosis not present

## 2022-04-03 DIAGNOSIS — R6250 Unspecified lack of expected normal physiological development in childhood: Secondary | ICD-10-CM | POA: Diagnosis not present

## 2022-04-03 NOTE — Patient Instructions (Addendum)
Pam Sanders needs to go to the ER for further evaluation. Her EKG was abnormal today with a prolonged QT interval and her heart rate is low. She also has felt short of breath and has had chest pain this week. She needs to go to the ER ASAP.  I have also placed an urgent referral to psychiatry for management of schizophrenia and her medications for this. Schizophrenia should be managed by a psychiatrist as many of the medications are high risk. I will provide refill as needed until her appointment to establish care with psychiatry, but pychiatry will have to take over prescribing medications from that point. I have placed a referral to [place] at [facility]. There information is below:  [place] [facility]. [address] [place],  [place]  [phone] Get Driving Directions Main: [phone]

## 2022-04-03 NOTE — Progress Notes (Signed)
Established Patient Office Visit  Subjective   Patient ID: Pam Sanders, female    DOB: [birth year]  Age: 71 y.o. MRN: [id number]  Chief Complaint  Patient presents with   Annual Exam    Shortness of Breath   Pam Sanders is here with her aide today. History is difficult to obtain due to intellectual disability and schizophrenia. Pam Sanders has reports chest pain intermittently for the last week. She is unable to report relieving or aggravating factors. She also has been short of breath, both at rest and with activity. She denies dizziness, lightheadedness, edema, focal weakness, or confusion.  She has a history of HTN. She is currently taking lasix, losartan, and aldactone.   Her previous PCP was managing her medications for schizophrenia. She is on thioridazine 50 mg daily and zoloft 50 mg. She also is prescribed Lamictal 100 mg daily by neurology for a seizure disorder. She has not had an EKG done since 2020.   Past Medical History:  Diagnosis Date   Gait disturbance    GERD (gastroesophageal reflux disease)    HA (headache)    Hernia of abdominal wall    History of hernia surgery    Hypertension    Lower extremity edema    Osteopenia 09/29/2020   Seizures (HCC)    Thyroid disease    Tremor       Review of Systems  Respiratory:  Positive for shortness of breath.    As per HPI.   Objective:     BP (!) 108/56   Pulse (!) 47   Temp 98.2 F (36.8 C)   Ht 5\' 1"  (1.549 m)   Wt 181 lb 12.8 oz (82.5 kg)   SpO2 93%   BMI 34.35 kg/m  BP Readings from Last 3 Encounters:  04/03/22 (!) 108/56  11/28/21 (!) 104/55  10/01/21 (!) 105/58      Physical Exam Vitals and nursing note reviewed.  Constitutional:      General: She is not in acute distress.    Appearance: She is not ill-appearing or toxic-appearing.  Cardiovascular:     Rate and Rhythm: Regular rhythm. Bradycardia present.     Heart sounds: Heart sounds not distant. No murmur heard. Pulmonary:     Breath  sounds: No stridor. No decreased breath sounds, wheezing, rhonchi or rales.     Comments: Tachypnea with activity Chest:     Chest wall: No tenderness.  Musculoskeletal:     Cervical back: Neck supple. No rigidity.     Right lower leg: No edema.     Left lower leg: No edema.  Skin:    General: Skin is warm and dry.  Neurological:     Mental Status: She is alert. Mental status is at baseline.  Psychiatric:        Mood and Affect: Mood normal.        Speech: Speech normal.        Cognition and Memory: Cognition is impaired.      No results found for any visits on 04/03/22.    The 10-year ASCVD risk score (Arnett DK, et al., 2019) is: 17%    Assessment & Plan:   Sung was seen today for shortness of breath.  Diagnoses and all orders for this visit:  Shortness of breath -     EKG 12-Lead  Chest pain, unspecified type  Symptomatic bradycardia  Prolonged Q-T interval on ECG -     EKG 12-Lead  Primary hypertension  Schizo-affective schizophrenia,  chronic condition (Pam Sanders) -     Ambulatory referral to Psychiatry  History of long-term treatment with high-risk medication -     EKG 12-Lead  Intellectual disability  Seizure disorder ([place])  EKG today with bradycardia and prolonged QT interval. Previous pulse was >60. Also has tachypnea with minimal activity. She reports shortness of breath and intermittent chest pain over last week. Recommended ER for further evaluation of symptomatic bradycardia, chest pain, and shortness of breath. Her aide has called Pam Sanders's sister Pam Sanders who did not answer at the time. Declined EMS now but will take Pam Sanders home to have [place] taker her to the ER. She assures me that Pam Sanders will take Pam Sanders to the ER ASAP.   Also discussed that I will place an urgent referral to psychiatry for management of schizophrenia and high risk medication use in the setting of a seizure disorder and QT prolongation.    Return in about 2 weeks (around 04/17/2022) for  chronic follow up.  The patient indicates understanding of these issues and agrees with the plan.     [name], FNP

## 2022-04-15 DIAGNOSIS — I10 Essential (primary) hypertension: Secondary | ICD-10-CM | POA: Diagnosis not present

## 2022-05-06 ENCOUNTER — Other Ambulatory Visit: Payer: Self-pay | Admitting: Family Medicine

## 2022-05-06 DIAGNOSIS — K219 Gastro-esophageal reflux disease without esophagitis: Secondary | ICD-10-CM

## 2022-05-06 DIAGNOSIS — I10 Essential (primary) hypertension: Secondary | ICD-10-CM

## 2022-05-06 DIAGNOSIS — F259 Schizoaffective disorder, unspecified: Secondary | ICD-10-CM

## 2022-05-14 ENCOUNTER — Telehealth: Payer: Self-pay | Admitting: *Deleted

## 2022-05-14 DIAGNOSIS — I10 Essential (primary) hypertension: Secondary | ICD-10-CM | POA: Diagnosis not present

## 2022-05-14 NOTE — Telephone Encounter (Signed)
I spoke to pt's sister and she says pt accidentally took the suppositories PO instead of inserting in the rectum. Advised them to call poison control for this and then she requested an appt with PCP to bring pt in and discuss referral to psych as well as possible placement in facility. Pt scheduled with PCP 2/19.

## 2022-05-16 DIAGNOSIS — R5381 Other malaise: Secondary | ICD-10-CM | POA: Diagnosis not present

## 2022-05-16 DIAGNOSIS — W19XXXA Unspecified fall, initial encounter: Secondary | ICD-10-CM | POA: Diagnosis not present

## 2022-05-16 DIAGNOSIS — T148XXA Other injury of unspecified body region, initial encounter: Secondary | ICD-10-CM | POA: Diagnosis not present

## 2022-05-19 ENCOUNTER — Encounter (HOSPITAL_COMMUNITY): Payer: Self-pay | Admitting: *Deleted

## 2022-05-19 ENCOUNTER — Telehealth: Payer: Self-pay | Admitting: *Deleted

## 2022-05-19 ENCOUNTER — Emergency Department (HOSPITAL_COMMUNITY): Payer: 59

## 2022-05-19 ENCOUNTER — Inpatient Hospital Stay (HOSPITAL_COMMUNITY)
Admission: EM | Admit: 2022-05-19 | Discharge: 2022-05-26 | DRG: 640 | Disposition: A | Discharge status: Hospice | Payer: 59 | Attending: Family Medicine | Admitting: Family Medicine

## 2022-05-19 ENCOUNTER — Other Ambulatory Visit: Payer: Self-pay

## 2022-05-19 DIAGNOSIS — S72114A Nondisplaced fracture of greater trochanter of right femur, initial encounter for closed fracture: Secondary | ICD-10-CM | POA: Diagnosis present

## 2022-05-19 DIAGNOSIS — K8020 Calculus of gallbladder without cholecystitis without obstruction: Secondary | ICD-10-CM | POA: Diagnosis not present

## 2022-05-19 DIAGNOSIS — Z79899 Other long term (current) drug therapy: Secondary | ICD-10-CM

## 2022-05-19 DIAGNOSIS — K761 Chronic passive congestion of liver: Secondary | ICD-10-CM | POA: Diagnosis not present

## 2022-05-19 DIAGNOSIS — K7200 Acute and subacute hepatic failure without coma: Secondary | ICD-10-CM | POA: Diagnosis not present

## 2022-05-19 DIAGNOSIS — G9389 Other specified disorders of brain: Secondary | ICD-10-CM | POA: Diagnosis not present

## 2022-05-19 DIAGNOSIS — I5082 Biventricular heart failure: Secondary | ICD-10-CM | POA: Diagnosis not present

## 2022-05-19 DIAGNOSIS — K5900 Constipation, unspecified: Secondary | ICD-10-CM | POA: Diagnosis present

## 2022-05-19 DIAGNOSIS — W19XXXA Unspecified fall, initial encounter: Secondary | ICD-10-CM | POA: Diagnosis not present

## 2022-05-19 DIAGNOSIS — Z66 Do not resuscitate: Secondary | ICD-10-CM | POA: Diagnosis present

## 2022-05-19 DIAGNOSIS — G40909 Epilepsy, unspecified, not intractable, without status epilepticus: Secondary | ICD-10-CM

## 2022-05-19 DIAGNOSIS — I499 Cardiac arrhythmia, unspecified: Secondary | ICD-10-CM | POA: Diagnosis not present

## 2022-05-19 DIAGNOSIS — Q048 Other specified congenital malformations of brain: Secondary | ICD-10-CM

## 2022-05-19 DIAGNOSIS — N1831 Chronic kidney disease, stage 3a: Secondary | ICD-10-CM | POA: Diagnosis not present

## 2022-05-19 DIAGNOSIS — R609 Edema, unspecified: Secondary | ICD-10-CM | POA: Diagnosis not present

## 2022-05-19 DIAGNOSIS — K760 Fatty (change of) liver, not elsewhere classified: Secondary | ICD-10-CM | POA: Diagnosis not present

## 2022-05-19 DIAGNOSIS — I429 Cardiomyopathy, unspecified: Secondary | ICD-10-CM | POA: Diagnosis not present

## 2022-05-19 DIAGNOSIS — Z79890 Hormone replacement therapy (postmenopausal): Secondary | ICD-10-CM

## 2022-05-19 DIAGNOSIS — Z7189 Other specified counseling: Secondary | ICD-10-CM | POA: Diagnosis not present

## 2022-05-19 DIAGNOSIS — Z1152 Encounter for screening for COVID-19: Secondary | ICD-10-CM

## 2022-05-19 DIAGNOSIS — E875 Hyperkalemia: Secondary | ICD-10-CM | POA: Diagnosis present

## 2022-05-19 DIAGNOSIS — J90 Pleural effusion, not elsewhere classified: Secondary | ICD-10-CM | POA: Diagnosis not present

## 2022-05-19 DIAGNOSIS — K7290 Hepatic failure, unspecified without coma: Secondary | ICD-10-CM | POA: Diagnosis not present

## 2022-05-19 DIAGNOSIS — E871 Hypo-osmolality and hyponatremia: Principal | ICD-10-CM | POA: Diagnosis present

## 2022-05-19 DIAGNOSIS — Z6841 Body Mass Index (BMI) 40.0 and over, adult: Secondary | ICD-10-CM | POA: Diagnosis not present

## 2022-05-19 DIAGNOSIS — I81 Portal vein thrombosis: Secondary | ICD-10-CM | POA: Diagnosis not present

## 2022-05-19 DIAGNOSIS — Z515 Encounter for palliative care: Secondary | ICD-10-CM

## 2022-05-19 DIAGNOSIS — E039 Hypothyroidism, unspecified: Secondary | ICD-10-CM | POA: Diagnosis not present

## 2022-05-19 DIAGNOSIS — F259 Schizoaffective disorder, unspecified: Secondary | ICD-10-CM | POA: Diagnosis present

## 2022-05-19 DIAGNOSIS — B372 Candidiasis of skin and nail: Secondary | ICD-10-CM | POA: Diagnosis present

## 2022-05-19 DIAGNOSIS — K219 Gastro-esophageal reflux disease without esophagitis: Secondary | ICD-10-CM | POA: Diagnosis present

## 2022-05-19 DIAGNOSIS — F79 Unspecified intellectual disabilities: Secondary | ICD-10-CM

## 2022-05-19 DIAGNOSIS — G40401 Other generalized epilepsy and epileptic syndromes, not intractable, with status epilepticus: Secondary | ICD-10-CM | POA: Diagnosis present

## 2022-05-19 DIAGNOSIS — N390 Urinary tract infection, site not specified: Secondary | ICD-10-CM | POA: Diagnosis present

## 2022-05-19 DIAGNOSIS — R7989 Other specified abnormal findings of blood chemistry: Secondary | ICD-10-CM | POA: Diagnosis not present

## 2022-05-19 DIAGNOSIS — I4891 Unspecified atrial fibrillation: Secondary | ICD-10-CM | POA: Diagnosis not present

## 2022-05-19 DIAGNOSIS — I480 Paroxysmal atrial fibrillation: Secondary | ICD-10-CM

## 2022-05-19 DIAGNOSIS — I5021 Acute systolic (congestive) heart failure: Secondary | ICD-10-CM | POA: Diagnosis not present

## 2022-05-19 DIAGNOSIS — R410 Disorientation, unspecified: Secondary | ICD-10-CM

## 2022-05-19 DIAGNOSIS — N1832 Chronic kidney disease, stage 3b: Secondary | ICD-10-CM | POA: Diagnosis not present

## 2022-05-19 DIAGNOSIS — H9190 Unspecified hearing loss, unspecified ear: Secondary | ICD-10-CM | POA: Diagnosis present

## 2022-05-19 DIAGNOSIS — M1611 Unilateral primary osteoarthritis, right hip: Secondary | ICD-10-CM | POA: Diagnosis not present

## 2022-05-19 DIAGNOSIS — I4819 Other persistent atrial fibrillation: Secondary | ICD-10-CM | POA: Diagnosis present

## 2022-05-19 DIAGNOSIS — Z8249 Family history of ischemic heart disease and other diseases of the circulatory system: Secondary | ICD-10-CM

## 2022-05-19 DIAGNOSIS — S7001XA Contusion of right hip, initial encounter: Secondary | ICD-10-CM | POA: Diagnosis not present

## 2022-05-19 DIAGNOSIS — I420 Dilated cardiomyopathy: Secondary | ICD-10-CM

## 2022-05-19 DIAGNOSIS — Z043 Encounter for examination and observation following other accident: Secondary | ICD-10-CM | POA: Diagnosis not present

## 2022-05-19 DIAGNOSIS — Z743 Need for continuous supervision: Secondary | ICD-10-CM | POA: Diagnosis not present

## 2022-05-19 DIAGNOSIS — M8580 Other specified disorders of bone density and structure, unspecified site: Secondary | ICD-10-CM | POA: Diagnosis present

## 2022-05-19 DIAGNOSIS — I130 Hypertensive heart and chronic kidney disease with heart failure and stage 1 through stage 4 chronic kidney disease, or unspecified chronic kidney disease: Secondary | ICD-10-CM | POA: Diagnosis not present

## 2022-05-19 DIAGNOSIS — R945 Abnormal results of liver function studies: Secondary | ICD-10-CM | POA: Diagnosis not present

## 2022-05-19 DIAGNOSIS — N183 Chronic kidney disease, stage 3 unspecified: Secondary | ICD-10-CM | POA: Diagnosis present

## 2022-05-19 DIAGNOSIS — I10 Essential (primary) hypertension: Secondary | ICD-10-CM | POA: Diagnosis present

## 2022-05-19 DIAGNOSIS — S0990XA Unspecified injury of head, initial encounter: Secondary | ICD-10-CM | POA: Diagnosis not present

## 2022-05-19 DIAGNOSIS — I5084 End stage heart failure: Secondary | ICD-10-CM | POA: Diagnosis not present

## 2022-05-19 DIAGNOSIS — I5189 Other ill-defined heart diseases: Secondary | ICD-10-CM

## 2022-05-19 DIAGNOSIS — I340 Nonrheumatic mitral (valve) insufficiency: Secondary | ICD-10-CM

## 2022-05-19 DIAGNOSIS — M4312 Spondylolisthesis, cervical region: Secondary | ICD-10-CM | POA: Diagnosis not present

## 2022-05-19 DIAGNOSIS — R296 Repeated falls: Secondary | ICD-10-CM | POA: Diagnosis present

## 2022-05-19 DIAGNOSIS — I5043 Acute on chronic combined systolic (congestive) and diastolic (congestive) heart failure: Secondary | ICD-10-CM | POA: Diagnosis not present

## 2022-05-19 HISTORY — DX: Hypothyroidism, unspecified: E03.9

## 2022-05-19 HISTORY — DX: Unspecified intellectual disabilities: F79

## 2022-05-19 HISTORY — DX: Schizophrenia, unspecified: F20.9

## 2022-05-19 LAB — BASIC METABOLIC PANEL
Anion gap: 7 (ref 5–15)
BUN: 21 mg/dL (ref 8–23)
CO2: 22 mmol/L (ref 22–32)
Calcium: 9 mg/dL (ref 8.9–10.3)
Chloride: 85 mmol/L — ABNORMAL LOW (ref 98–111)
Creatinine, Ser: 1.25 mg/dL — ABNORMAL HIGH (ref 0.44–1.00)
GFR, Estimated: 46 mL/min — ABNORMAL LOW (ref >60)
Glucose, Bld: 97 mg/dL (ref 70–99)
Potassium: 5.1 mmol/L (ref 3.5–5.1)
Sodium: 114 mmol/L — CL (ref 135–145)

## 2022-05-19 LAB — URINALYSIS, ROUTINE W REFLEX MICROSCOPIC
Bilirubin Urine: NEGATIVE
Glucose, UA: NEGATIVE mg/dL
Ketones, ur: NEGATIVE mg/dL
Nitrite: NEGATIVE
Protein, ur: 30 mg/dL — AB
Specific Gravity, Urine: 1.009 (ref 1.005–1.030)
pH: 6 (ref 5.0–8.0)

## 2022-05-19 LAB — CBC WITH DIFFERENTIAL/PLATELET
Abs Immature Granulocytes: 0.04 10*3/uL (ref 0.00–0.07)
Basophils Absolute: 0 10*3/uL (ref 0.0–0.1)
Basophils Relative: 0 %
Eosinophils Absolute: 0.1 10*3/uL (ref 0.0–0.5)
Eosinophils Relative: 1 %
HCT: 32.8 % — ABNORMAL LOW (ref 36.0–46.0)
Hemoglobin: 11.4 g/dL — ABNORMAL LOW (ref 12.0–15.0)
Immature Granulocytes: 0 %
Lymphocytes Relative: 10 %
Lymphs Abs: 1 10*3/uL (ref 0.7–4.0)
MCH: 33.3 pg (ref 26.0–34.0)
MCHC: 34.8 g/dL (ref 30.0–36.0)
MCV: 95.9 fL (ref 80.0–100.0)
Monocytes Absolute: 1.1 10*3/uL — ABNORMAL HIGH (ref 0.1–1.0)
Monocytes Relative: 11 %
Neutro Abs: 7.7 10*3/uL (ref 1.7–7.7)
Neutrophils Relative %: 78 %
Platelets: 226 10*3/uL (ref 150–400)
RBC: 3.42 MIL/uL — ABNORMAL LOW (ref 3.87–5.11)
RDW: 14.3 % (ref 11.5–15.5)
WBC: 10 10*3/uL (ref 4.0–10.5)
nRBC: 0 % (ref 0.0–0.2)

## 2022-05-19 LAB — RAPID URINE DRUG SCREEN, HOSP PERFORMED
Amphetamines: NOT DETECTED
Barbiturates: NOT DETECTED
Benzodiazepines: NOT DETECTED
Cocaine: NOT DETECTED
Opiates: NOT DETECTED
Tetrahydrocannabinol: NOT DETECTED

## 2022-05-19 LAB — RESP PANEL BY RT-PCR (RSV, FLU A&B, COVID)  RVPGX2
Influenza A by PCR: NEGATIVE
Influenza B by PCR: NEGATIVE
Resp Syncytial Virus by PCR: NEGATIVE
SARS Coronavirus 2 by RT PCR: NEGATIVE

## 2022-05-19 LAB — COMPREHENSIVE METABOLIC PANEL
ALT: 102 U/L — ABNORMAL HIGH (ref 0–44)
AST: 129 U/L — ABNORMAL HIGH (ref 15–41)
Albumin: 3.8 g/dL (ref 3.5–5.0)
Alkaline Phosphatase: 85 U/L (ref 38–126)
Anion gap: 9 (ref 5–15)
BUN: 21 mg/dL (ref 8–23)
CO2: 23 mmol/L (ref 22–32)
Calcium: 9.2 mg/dL (ref 8.9–10.3)
Chloride: 82 mmol/L — ABNORMAL LOW (ref 98–111)
Creatinine, Ser: 1.27 mg/dL — ABNORMAL HIGH (ref 0.44–1.00)
GFR, Estimated: 45 mL/min — ABNORMAL LOW (ref >60)
Glucose, Bld: 101 mg/dL — ABNORMAL HIGH (ref 70–99)
Potassium: 5.3 mmol/L — ABNORMAL HIGH (ref 3.5–5.1)
Sodium: 114 mmol/L — CL (ref 135–145)
Total Bilirubin: 0.7 mg/dL (ref 0.3–1.2)
Total Protein: 7.1 g/dL (ref 6.5–8.1)

## 2022-05-19 LAB — T4, FREE: Free T4: 1.07 ng/dL (ref 0.61–1.12)

## 2022-05-19 LAB — OSMOLALITY, URINE: Osmolality, Ur: 264 mosm/kg — ABNORMAL LOW (ref 300–900)

## 2022-05-19 LAB — CBG MONITORING, ED: Glucose-Capillary: 108 mg/dL — ABNORMAL HIGH (ref 70–99)

## 2022-05-19 LAB — TSH: TSH: 10.288 u[IU]/mL — ABNORMAL HIGH (ref 0.350–4.500)

## 2022-05-19 LAB — NA AND K (SODIUM & POTASSIUM), RAND UR
Potassium Urine: 34 mmol/L
Sodium, Ur: <10 mmol/L

## 2022-05-19 LAB — SODIUM
Sodium: 114 mmol/L — CL (ref 135–145)
Sodium: 116 mmol/L — CL (ref 135–145)

## 2022-05-19 LAB — ETHANOL: Alcohol, Ethyl (B): <10 mg/dL (ref <10)

## 2022-05-19 LAB — MRSA NEXT GEN BY PCR, NASAL: MRSA by PCR Next Gen: NOT DETECTED

## 2022-05-19 LAB — OSMOLALITY: Osmolality: 256 mOsm/kg — ABNORMAL LOW (ref 275–295)

## 2022-05-19 MED ORDER — HEPARIN SODIUM (PORCINE) 5000 UNIT/ML IJ SOLN
5000.0000 [IU] | Freq: Three times a day (TID) | INTRAMUSCULAR | Status: DC
  Administered 2022-05-19 – 2022-05-26 (×20): 5000 [IU] via SUBCUTANEOUS
  Filled 2022-05-19 (×20): qty 1

## 2022-05-19 MED ORDER — SODIUM CHLORIDE 3 % IV SOLN
INTRAVENOUS | Status: DC
  Filled 2022-05-19 (×2): qty 500

## 2022-05-19 MED ORDER — POLYETHYLENE GLYCOL 3350 17 G PO PACK: 17.0000 g | PACK | Freq: Every day | ORAL | Status: DC | PRN

## 2022-05-19 MED ORDER — SODIUM CHLORIDE 0.9 % IV BOLUS
1000.0000 mL | Freq: Once | INTRAVENOUS | Status: AC
  Administered 2022-05-19: 1000 mL via INTRAVENOUS

## 2022-05-19 MED ORDER — SODIUM CHLORIDE 0.9 % IV SOLN
1.0000 g | INTRAVENOUS | Status: AC
  Administered 2022-05-19 – 2022-05-21 (×3): 1 g via INTRAVENOUS
  Filled 2022-05-19 (×3): qty 10

## 2022-05-19 MED ORDER — ACETAMINOPHEN 325 MG PO TABS
650.0000 mg | ORAL_TABLET | Freq: Four times a day (QID) | ORAL | Status: DC | PRN
  Administered 2022-05-20 – 2022-05-23 (×3): 650 mg via ORAL
  Filled 2022-05-19 (×3): qty 2

## 2022-05-19 MED ORDER — METOPROLOL TARTRATE 5 MG/5ML IV SOLN
5.0000 mg | Freq: Three times a day (TID) | INTRAVENOUS | Status: DC
  Administered 2022-05-19 – 2022-05-21 (×5): 5 mg via INTRAVENOUS
  Filled 2022-05-19 (×5): qty 5

## 2022-05-19 MED ORDER — METOPROLOL TARTRATE 5 MG/5ML IV SOLN
5.0000 mg | INTRAVENOUS | Status: DC | PRN
  Filled 2022-05-19: qty 5

## 2022-05-19 MED ORDER — ORAL CARE MOUTH RINSE: 15.0000 mL | OROMUCOSAL | Status: DC | PRN

## 2022-05-19 MED ORDER — CHLORHEXIDINE GLUCONATE CLOTH 2 % EX PADS
6.0000 | MEDICATED_PAD | Freq: Every day | CUTANEOUS | Status: DC
  Administered 2022-05-19 – 2022-05-25 (×7): 6 via TOPICAL

## 2022-05-19 MED ORDER — TRAZODONE HCL 50 MG PO TABS
50.0000 mg | ORAL_TABLET | Freq: Every evening | ORAL | Status: DC | PRN
  Administered 2022-05-21: 50 mg via ORAL
  Filled 2022-05-19: qty 1

## 2022-05-19 MED ORDER — LEVOTHYROXINE SODIUM 100 MCG PO TABS
200.0000 ug | ORAL_TABLET | Freq: Every day | ORAL | Status: DC
  Administered 2022-05-20: 200 ug via ORAL
  Filled 2022-05-19 (×2): qty 2

## 2022-05-19 MED ORDER — SODIUM ZIRCONIUM CYCLOSILICATE 10 G PO PACK
10.0000 g | PACK | Freq: Two times a day (BID) | ORAL | Status: AC
  Administered 2022-05-19 (×2): 10 g via ORAL
  Filled 2022-05-19 (×2): qty 1

## 2022-05-19 MED ORDER — SODIUM CHLORIDE 3 % IV SOLN
INTRAVENOUS | Status: DC
  Filled 2022-05-19: qty 500

## 2022-05-19 MED ORDER — CALCIUM CARBONATE 1250 (500 CA) MG PO TABS
1250.0000 mg | ORAL_TABLET | Freq: Every day | ORAL | Status: DC
  Administered 2022-05-20 – 2022-05-26 (×7): 1250 mg via ORAL
  Filled 2022-05-19 (×7): qty 1

## 2022-05-19 MED ORDER — ADULT MULTIVITAMIN W/MINERALS CH
1.0000 | ORAL_TABLET | Freq: Every day | ORAL | Status: DC
  Administered 2022-05-20 – 2022-05-25 (×6): 1 via ORAL
  Filled 2022-05-19 (×7): qty 1

## 2022-05-19 MED ORDER — SODIUM CHLORIDE 3% (HYPERTONIC SALINE) BOLUS VIA INFUSION: 150.0000 mL | INTRAVENOUS | Status: DC

## 2022-05-19 MED ORDER — PANTOPRAZOLE SODIUM 40 MG PO TBEC
40.0000 mg | DELAYED_RELEASE_TABLET | Freq: Every day | ORAL | Status: DC
  Administered 2022-05-19 – 2022-05-25 (×7): 40 mg via ORAL
  Filled 2022-05-19 (×8): qty 1

## 2022-05-19 MED ORDER — LEVOTHYROXINE SODIUM 75 MCG PO TABS: 150.0000 ug | ORAL_TABLET | Freq: Every day | ORAL | Status: DC

## 2022-05-19 MED ORDER — LACTATED RINGERS IV BOLUS
1000.0000 mL | Freq: Once | INTRAVENOUS | Status: AC
  Administered 2022-05-19: 1000 mL via INTRAVENOUS

## 2022-05-19 MED ORDER — SODIUM CHLORIDE 0.9% FLUSH: 3.0000 mL | INTRAVENOUS | Status: DC | PRN

## 2022-05-19 MED ORDER — SODIUM CHLORIDE 3 % IV SOLN
INTRAVENOUS | Status: AC
  Filled 2022-05-19: qty 500

## 2022-05-19 MED ORDER — FAMOTIDINE 20 MG PO TABS
20.0000 mg | ORAL_TABLET | Freq: Every day | ORAL | Status: DC
  Administered 2022-05-19 – 2022-05-25 (×7): 20 mg via ORAL
  Filled 2022-05-19 (×7): qty 1

## 2022-05-19 MED ORDER — SODIUM CHLORIDE 0.9% FLUSH
3.0000 mL | Freq: Two times a day (BID) | INTRAVENOUS | Status: DC
  Administered 2022-05-20 – 2022-05-26 (×12): 3 mL via INTRAVENOUS

## 2022-05-19 MED ORDER — SODIUM CHLORIDE 0.9% FLUSH
3.0000 mL | Freq: Two times a day (BID) | INTRAVENOUS | Status: DC
  Administered 2022-05-20 – 2022-05-25 (×11): 3 mL via INTRAVENOUS

## 2022-05-19 MED ORDER — BISACODYL 10 MG RE SUPP
10.0000 mg | Freq: Every day | RECTAL | Status: DC | PRN
  Administered 2022-05-24 – 2022-05-25 (×2): 10 mg via RECTAL
  Filled 2022-05-19 (×2): qty 1

## 2022-05-19 MED ORDER — SODIUM CHLORIDE 0.9 % IV SOLN: INTRAVENOUS | Status: DC | PRN

## 2022-05-19 MED ORDER — VITAMIN D 25 MCG (1000 UNIT) PO TABS
1000.0000 [IU] | ORAL_TABLET | Freq: Every day | ORAL | Status: DC
  Administered 2022-05-20 – 2022-05-25 (×6): 1000 [IU] via ORAL
  Filled 2022-05-19 (×7): qty 1

## 2022-05-19 MED ORDER — ACETAMINOPHEN 650 MG RE SUPP: 650.0000 mg | Freq: Four times a day (QID) | RECTAL | Status: DC | PRN

## 2022-05-19 MED ORDER — METOPROLOL TARTRATE 5 MG/5ML IV SOLN
5.0000 mg | INTRAVENOUS | Status: AC
  Administered 2022-05-19: 5 mg via INTRAVENOUS

## 2022-05-19 NOTE — Progress Notes (Signed)
Date and time results received: 05/19/22 1425 (use smartphrase ".now" to insert current time)  Test: Sodium  Critical Value: 114  Name of Provider Notified: Dr Joesph Fillers  Orders Received? Or Actions Taken?:  patient currently on 3%  hypertonic fluid

## 2022-05-19 NOTE — ED Triage Notes (Signed)
Pt brought in by [facility] for c/o fall over the weekend; family was unable to tell ems which day pt fell but states she fell while in bathtub and has bruising to her back  Pt denies any pain at this time

## 2022-05-19 NOTE — Progress Notes (Addendum)
Date and time results received: 05/19/22 1818 (use smartphrase ".now" to insert current time)  Test: Sodium Critical Value: 114  Name of Provider Notified: Courage, MD.  Orders Received? Or Actions Taken? Increasing 3% Saline to 30 mL/ hr.

## 2022-05-19 NOTE — H&P (Signed)
Patient Demographics:    Pam Sanders, is a 72 y.o. female  MRN: [id number]   DOB - Nov 12, 1950  Admit Date - 05/19/2022  Outpatient Primary MD for the patient is Pam Perking, FNP   Assessment & Plan:   Assessment and Plan:  1)Symptomatic Severe Hyponatremia----factorial in the setting of recent stomach bug with vomiting or diarrhea, compounded by Lasix and Aldactone use as well as poor oral intake sodium of 114 (sodium was 141 on 10/01/2021) -Patient with metabolic encephalopathy, gait problems- -as per Pam Sanders who is at bedside this is not patient's baseline at all -Patient received IV fluid boluses in the ED -Okay to continue 3% saline at this time with sodium checks every 4 hours  2)Elevated LFTs--- -AST 129 ALT 1 2, alk phos 85 T. 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 ventriculomegaly) -CT C-spine without acute findings -Need PT eval prior to discharge  5) possible right greater trochanter fracture--- patient will need PT eval prior to discharge Hip and pelvic x-rays with possible nondisplaced fracture of the greater trochanter on the right  6) hypothyroidism----TSH is 10.2 -Free T4 at 1.07 -Increase levothyroxine to 200 mcg daily from 150 mcg PTA  7) CKD 3A with hyperkalemia--- creatinine is 1.27 which is close to prior baseline -potassium is 5.3 -Lokelma as ordered - renally adjust medications, avoid nephrotoxic agents / dehydration   / hypotension  8) history of seizures-- history of seizures and intellectual disability in the setting of cerebral ventriculomegaly- -no recent seizures -Not on antiepileptic agents PTA -Monitor closely especially with severe hyponatremia and risk for seizures  9) possible UTI--- continue Rocephin pending culture data  Status is: Inpatient  Remains inpatient appropriate because:   Dispo: The patient is from: Home              Anticipated d/c is to: SNF              Anticipated d/c date is: > 3 days              Patient currently is not medically stable to d/c. Barriers: Not Clinically Stable-   With History of - Reviewed by me  Past Medical History:  Diagnosis Date   Gait disturbance    GERD (gastroesophageal reflux disease)    HA (headache)    Hernia of abdominal wall    History of hernia surgery    Hypertension    Lower extremity edema    Osteopenia 09/29/2020   Seizures (HCC)    Thyroid disease    Tremor       Past Surgical History:  Procedure Laterality Date   CATARACT EXTRACTION, BILATERAL  STOMACH SURGERY     VENTRAL HERNIA REPAIR  11/09/2020   Chief Complaint  Patient presents with   Fall      HPI:    Pam Sanders  is a 72 y.o. female with past medical history relevant for history of seizures and intellectual disability in the setting of cerebral ventriculomegaly, GERD, hypothyroidism, schizophrenia and HTN presented to the ED with falls and weakness/lethargy and altered mentation - Additional history obtained from patient's sister  [name] at bedside -Apparently patient had a stomach bug with loss of emesis and a bit of diarrhea last week-- -she was taking Lasix and Aldactone =-She became very weak and started to have falls and ambulatory dysfunction as well as very very poor oral intake over the last week or so -Apparently no fevers or productive cough In the ED she is found to have a sodium of 114 (sodium was 141 on 10/01/2021) -UA suggestive  of UTI -UDS negative -Sodium is 114, potassium is 5.3, bicarb is 23, creatinine 1.27, anion gap is 9 -COVID flu and RSV negative -blood alcohol level negative -WBC 10.0, -Hgb 11.4, platelets 226 -AST 129 ALT 1 2, alk phos 85 T. 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   Social History:  Reviewed by me   Social History   Tobacco Use   Smoking status: Never   Smokeless tobacco: Never  Substance Use Topics   Alcohol use: No    Family History :  Reviewed by me    Family History  Problem Relation Age of Onset   Stroke Mother    Stroke Father    Stroke Other    Diabetes Other    Heart Problems Other    Thyroid cancer Sister    Hypertension Sister    Emphysema Brother    Other Brother        died at birth   Aneurysm Brother        stomach   Throat cancer Brother    Emphysema Brother    Heart disease Brother    Stroke Brother    Other Brother        MVA   Other Sister        died at birth   Aneurysm Sister        head    Home Medications:   Prior to Admission medications   Medication Sig Start Date End Date Taking? Authorizing Provider  atorvastatin (LIPITOR) 10 MG tablet TAKE ONE TABLET ONCE DAILY 12/18/21  Yes Hendricks Limes F, FNP  calcium carbonate (OSCAL) 1500 (600 Ca) MG TABS tablet Take 1,200 mg by mouth daily.   Yes [provider]  cetirizine (ZYRTEC) 10 MG tablet TAKE 1 TABLET DAILY FOR ALLERGY SYMPTOMS 12/27/21  Yes Hendricks Limes F, FNP  Cholecalciferol  (VITAMIN D-3) 1000 UNITS CAPS Take 1,000 Units by mouth daily.    Yes [provider]  famotidine (PEPCID) 20 MG tablet Take 1 tablet (20 mg total) by mouth at bedtime. (NEEDS TO  BE SEEN BEFORE NEXT REFILL) 05/06/22  Yes Pam Perking, FNP  fluticasone [facility]) 50 MCG/ACT nasal spray USE 2 SPRAYS INTO EACH NOSTRIL ONCE DAILY AT BEDTIME 08/19/21  Yes Loman Brooklyn, FNP  furosemide (LASIX) 20 MG tablet Take 1 tablet (20 mg total) by mouth daily. (NEEDS TO BE SEEN BEFORE NEXT REFILL) 05/06/22  Yes Pam Perking, FNP  lamoTRIgine (LAMICTAL) 100 MG tablet One qam, 2qhs Patient taking differently: 1-2 tablets 2 (two) times daily. Take 1 tablet in the morning and 2 tablets at bedtime 11/28/21  Yes Marcial Pacas, MD  levothyroxine (SYNTHROID) 150 MCG tablet Take 1 tablet (150 mcg total) by mouth daily before breakfast. 10/01/21  Yes Loman Brooklyn, FNP  loratadine (CLARITIN) 10 MG tablet Take 1 tablet (10 mg total) by mouth daily. 09/06/19  Yes Loman Brooklyn, FNP  losartan (COZAAR) 25 MG tablet Take 1 tablet (25 mg total) by mouth daily. (NEEDS TO BE SEEN BEFORE NEXT REFILL) 05/06/22  Yes Pam Perking, FNP  Melatonin 5 MG CAPS Take 5 mg by mouth at bedtime.   Yes [provider]  Multiple Vitamin (MULTIVITAMIN WITH MINERALS) TABS tablet Take 1 tablet by mouth daily.   Yes [provider]  Olopatadine HCl 0.2 % SOLN Apply 1 drop to eye daily. 12/23/18  Yes Loman Brooklyn, FNP  Omega-3 Fatty Acids (FISH OIL) 1000 MG CAPS Take 1 capsule by mouth daily.    Yes [provider]  omeprazole (PRILOSEC) 20 MG capsule TAKE 1 CAPSULE 2 TIMES A DAY BEFORE A MEAL 12/18/21  Yes Hendricks Limes F, FNP  potassium chloride SA (KLOR-CON M) 20 MEQ tablet Take 1 tablet (20 mEq total) by mouth 2 (two) times daily. (NEEDS TO BE SEEN BEFORE NEXT REFILL) 05/06/22  Yes Pam Perking, FNP  sertraline (ZOLOFT) 50 MG tablet Take 1 tablet (50 mg total) by mouth daily. (NEEDS TO BE SEEN  BEFORE NEXT REFILL) 05/06/22  Yes Pam Perking, FNP  spironolactone (ALDACTONE) 25 MG tablet Take 1 tablet (25 mg total) by mouth daily. (NEEDS TO BE SEEN BEFORE NEXT REFILL) 05/06/22  Yes Pam Perking, FNP  thioridazine (MELLARIL) 50 MG tablet Take 1 tablet (50 mg total) by mouth at bedtime. (NEEDS TO BE SEEN BEFORE NEXT REFILL) 05/06/22  Yes Pam Perking, FNP  Clotrimazole 1 % OINT Apply 1 application topically 2 (two) times daily. Patient not taking: Reported on 05/19/2022 01/12/19   Loman Brooklyn, FNP  LINZESS 72 MCG capsule TAKE 1 CAPSULE DAILY BEFORE BREAKFAST Patient not taking: Reported on 05/19/2022 02/27/20   Mahala Menghini, PA-C     Allergies:    No Known Allergies   Physical Exam:   Vitals  Blood pressure 109/73, pulse (!) 140, temperature 98.2 F (36.8 C), temperature source Oral, resp. rate 19, height 4' 8"$  (1.422 m), weight 97.4 kg, SpO2 99 %.  Physical Examination: General appearance -very lethargic,  in no distress Mental status -surgical and only oriented to self  eyes - sclera anicteric Neck - supple, no JVD elevation , Chest - clear  to auscultation bilaterally, symmetrical air movement,  Heart - S1 and S2 normal, regular Abdomen - soft, nontender, nondistended, +BS Neurological -neuroexam limited as patient is lethargic with altered mentation , she is moving extremities spontaneously does not appear to have significant focal deficits at this time extremities - +ve  pedal edema noted, intact peripheral pulses  Skin - warm, dry     Data  Review:    CBC Recent Labs  Lab 05/19/22 1030  WBC 10.0  HGB 11.4*  HCT 32.8*  PLT 226  MCV 95.9  MCH 33.3  MCHC 34.8  RDW 14.3  LYMPHSABS 1.0  MONOABS 1.1*  EOSABS 0.1  BASOSABS 0.0   ------------------------------------------------------------------------------------------------------------------  Chemistries  Recent Labs  Lab 05/19/22 1030 05/19/22 1551 05/19/22 1758  NA 114* 114* 114*  K  5.3* 5.1  --   CL 82* 85*  --   CO2 23 22  --   GLUCOSE 101* 97  --   BUN 21 21  --   CREATININE 1.27* 1.25*  --   CALCIUM 9.2 9.0  --   AST 129*  --   --   ALT 102*  --   --   ALKPHOS 85  --   --   BILITOT 0.7  --   --    ------------------------------------------------------------------------------------------------------------------ estimated creatinine clearance is 39.6 mL/min (A) (by C-G formula based on SCr of 1.25 mg/dL (H)). ------------------------------------------------------------------------------------------------------------------ Recent Labs    05/19/22 1030  TSH 10.288*   --------------------------------------------------------------------------------------------------------------  Urinalysis    Component Value Date/Time   COLORURINE YELLOW 05/19/2022 1635   APPEARANCEUR HAZY (A) 05/19/2022 1635   APPEARANCEUR Clear 12/05/2020 1124   LABSPEC 1.009 05/19/2022 1635   PHURINE 6.0 05/19/2022 1635   GLUCOSEU NEGATIVE 05/19/2022 1635   HGBUR SMALL (A) 05/19/2022 1635   BILIRUBINUR NEGATIVE 05/19/2022 1635   BILIRUBINUR Negative 12/05/2020 1124   KETONESUR NEGATIVE 05/19/2022 1635   PROTEINUR 30 (A) 05/19/2022 1635   NITRITE NEGATIVE 05/19/2022 1635   LEUKOCYTESUR LARGE (A) 05/19/2022 1635    ----------------------------------------------------------------------------------------------------------------   Imaging Results:    DG Chest 2 View  Result Date: 05/19/2022 CLINICAL DATA:  ams, fall with bruise to R upper back EXAM: CHEST - 2 VIEW COMPARISON:  Radiograph 04/03/2022 FINDINGS: Borderline enlarged cardiac silhouette. Low low lung volumes. Trace pleural effusions. No definite airspace consolidation. Bilateral shoulder degenerative changes. Thoracic spondylosis. No evidence of acute fracture. IMPRESSION: Borderline enlarged cardiac silhouette. Low lung volumes. Trace pleural effusions. No definite airspace consolidation. No evidence of acute osseous  abnormality. Electronically Signed   By: Maurine Simmering M.D.   On: 05/19/2022 11:42   DG HIP UNILAT WITH PELVIS 2-3 VIEWS RIGHT  Result Date: 05/19/2022 CLINICAL DATA:  Bruising, fall EXAM: DG HIP (WITH OR WITHOUT PELVIS) 2-3V RIGHT COMPARISON:  Radiograph 08/29/2012 FINDINGS: There is cortical irregularity of the greater trochanter. No evidence of femoral neck fracture. There is mild osteoarthritis of the hips. IMPRESSION: Cortical irregularity of the greater trochanter, could represent a nondisplaced fracture. Consider CT. Electronically Signed   By: Maurine Simmering M.D.   On: 05/19/2022 11:28   CT CERVICAL SPINE WO CONTRAST  Result Date: 05/19/2022 CLINICAL DATA:  72 year old female status post fall in bathtub over the weekend. EXAM: CT CERVICAL SPINE WITHOUT CONTRAST TECHNIQUE: Multidetector CT imaging of the cervical spine was performed without intravenous contrast. Multiplanar CT image reconstructions were also generated. RADIATION DOSE REDUCTION: This exam was performed according to the departmental dose-optimization program which includes automated exposure control, adjustment of the mA and/or kV according to patient size and/or use of iterative reconstruction technique. COMPARISON:  Head CT today.  Cervical spine CT 04/28/2016. FINDINGS: Alignment: Mildly improved cervical lordosis compared to 2018. Chronic degenerative appearing mild anterolisthesis of C4 on C5 has not significantly changed. Cervicothoracic junction alignment is within normal limits. Bilateral posterior element alignment is within normal limits. Skull base and vertebrae: Visualized skull base  is intact. No atlanto-occipital dissociation. C1 and C2 appear intact and aligned. No acute osseous abnormality identified. Soft tissues and spinal canal: No prevertebral fluid or swelling. No visible canal hematoma. Mild motion artifact. Disc levels: Chronic C2 through C3 posterior element and/or interbody ankylosis. Associated chronic C4-C5  spondylolisthesis and facet arthropathy which is greater on the right. And chronic disc and endplate degeneration [id number] and C6-C7. However, fairly capacious spinal canal. Doubt spinal stenosis. Upper chest: T1 inferior endplate deformity most resembling a Schmorl's node is present in 2018 but is larger. Evidence of a layering right pleural effusion. Otherwise negative lung apices. IMPRESSION: 1. No acute traumatic injury identified in the cervical spine. 2. Chronic cervical spine ankylosis and degeneration but no CT evidence of spinal stenosis. 3. Layering right pleural effusion visible in the upper chest. Electronically Signed   By: Genevie Ann M.D.   On: 05/19/2022 11:09   CT HEAD WO CONTRAST  Result Date: 05/19/2022 CLINICAL DATA:  72 year old female status post fall in bathtub over the weekend. EXAM: CT HEAD WITHOUT CONTRAST TECHNIQUE: Contiguous axial images were obtained from the base of the skull through the vertex without intravenous contrast. RADIATION DOSE REDUCTION: This exam was performed according to the departmental dose-optimization program which includes automated exposure control, adjustment of the mA and/or kV according to patient size and/or use of iterative reconstruction technique. COMPARISON:  Head CT 11/01/2019. FINDINGS: Brain: Chronic colpocephaly, and asymmetric atrophy or encephalomalacia in the left occipital lobe. This is associated with chronic coarse calcified dural thickening and posterior midline shift toward the smaller hemisphere. Chronic cerebellar volume loss also which is more pronounced on the right. No evidence of transependymal edema. Basilar cisterns remain patent. No superimposed No midline shift, ventriculomegaly, mass effect, evidence of mass lesion, intracranial hemorrhage or evidence of cortically based acute infarction. Vascular: Calcified atherosclerosis at the skull base. No suspicious intracranial vascular hyperdensity. Skull: No acute osseous abnormality  identified. Chronic hyperostosis. Sinuses/Orbits: Visualized paranasal sinuses and mastoids are clear. Other: No acute orbit or scalp soft tissue injury identified. IMPRESSION: 1. No acute intracranial abnormality or acute traumatic injury identified. 2. Chronic and likely 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, colpocephaly. Coarse dural calcification and hyperostosis of calvarium. Electronically Signed   By: Genevie Ann M.D.   On: 05/19/2022 11:06    Radiological Exams on Admission: DG Chest 2 View  Result Date: 05/19/2022 CLINICAL DATA:  ams, fall with bruise to R upper back EXAM: CHEST - 2 VIEW COMPARISON:  Radiograph 04/03/2022 FINDINGS: Borderline enlarged cardiac silhouette. Low low lung volumes. Trace pleural effusions. No definite airspace consolidation. Bilateral shoulder degenerative changes. Thoracic spondylosis. No evidence of acute fracture. IMPRESSION: Borderline enlarged cardiac silhouette. Low lung volumes. Trace pleural effusions. No definite airspace consolidation. No evidence of acute osseous abnormality. Electronically Signed   By: Maurine Simmering M.D.   On: 05/19/2022 11:42   DG HIP UNILAT WITH PELVIS 2-3 VIEWS RIGHT  Result Date: 05/19/2022 CLINICAL DATA:  Bruising, fall EXAM: DG HIP (WITH OR WITHOUT PELVIS) 2-3V RIGHT COMPARISON:  Radiograph 08/29/2012 FINDINGS: There is cortical irregularity of the greater trochanter. No evidence of femoral neck fracture. There is mild osteoarthritis of the hips. IMPRESSION: Cortical irregularity of the greater trochanter, could represent a nondisplaced fracture. Consider CT. Electronically Signed   By: Maurine Simmering M.D.   On: 05/19/2022 11:28   CT CERVICAL SPINE WO CONTRAST  Result Date: 05/19/2022 CLINICAL DATA:  72 year old female status post fall in bathtub over the weekend. EXAM:  CT CERVICAL SPINE WITHOUT CONTRAST TECHNIQUE: Multidetector CT imaging of the cervical spine was performed without intravenous contrast. Multiplanar CT  image reconstructions were also generated. RADIATION DOSE REDUCTION: This exam was performed according to the departmental dose-optimization program which includes automated exposure control, adjustment of the mA and/or kV according to patient size and/or use of iterative reconstruction technique. COMPARISON:  Head CT today.  Cervical spine CT 04/28/2016. FINDINGS: Alignment: Mildly improved cervical lordosis compared to 2018. Chronic degenerative appearing mild anterolisthesis of C4 on C5 has not significantly changed. Cervicothoracic junction alignment is within normal limits. Bilateral posterior element alignment is within normal limits. Skull base and vertebrae: Visualized skull base is intact. No atlanto-occipital dissociation. C1 and C2 appear intact and aligned. No acute osseous abnormality identified. Soft tissues and spinal canal: No prevertebral fluid or swelling. No visible canal hematoma. Mild motion artifact. Disc levels: Chronic C2 through C3 posterior element and/or interbody ankylosis. Associated chronic C4-C5 spondylolisthesis and facet arthropathy which is greater on the right. And chronic disc and endplate degeneration [id number] and C6-C7. However, fairly capacious spinal canal. Doubt spinal stenosis. Upper chest: T1 inferior endplate deformity most resembling a Schmorl's node is present in 2018 but is larger. Evidence of a layering right pleural effusion. Otherwise negative lung apices. IMPRESSION: 1. No acute traumatic injury identified in the cervical spine. 2. Chronic cervical spine ankylosis and degeneration but no CT evidence of spinal stenosis. 3. Layering right pleural effusion visible in the upper chest. Electronically Signed   By: Genevie Ann M.D.   On: 05/19/2022 11:09   CT HEAD WO CONTRAST  Result Date: 05/19/2022 CLINICAL DATA:  72 year old female status post fall in bathtub over the weekend. EXAM: CT HEAD WITHOUT CONTRAST TECHNIQUE: Contiguous axial images were obtained from the base of  the skull through the vertex without intravenous contrast. RADIATION DOSE REDUCTION: This exam was performed according to the departmental dose-optimization program which includes automated exposure control, adjustment of the mA and/or kV according to patient size and/or use of iterative reconstruction technique. COMPARISON:  Head CT 11/01/2019. FINDINGS: Brain: Chronic colpocephaly, and asymmetric atrophy or encephalomalacia in the left occipital lobe. This is associated with chronic coarse calcified dural thickening and posterior midline shift toward the smaller hemisphere. Chronic cerebellar volume loss also which is more pronounced on the right. No evidence of transependymal edema. Basilar cisterns remain patent. No superimposed No midline shift, ventriculomegaly, mass effect, evidence of mass lesion, intracranial hemorrhage or evidence of cortically based acute infarction. Vascular: Calcified atherosclerosis at the skull base. No suspicious intracranial vascular hyperdensity. Skull: No acute osseous abnormality identified. Chronic hyperostosis. Sinuses/Orbits: Visualized paranasal sinuses and mastoids are clear. Other: No acute orbit or scalp soft tissue injury identified. IMPRESSION: 1. No acute intracranial abnormality or acute traumatic injury identified. 2. Chronic and likely 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, colpocephaly. Coarse dural calcification and hyperostosis of calvarium. Electronically Signed   By: Genevie Ann M.D.   On: 05/19/2022 11:06    DVT Prophylaxis -SCD /heparin AM Labs Ordered, also please review Full Orders  Family Communication: Admission, patients condition and plan of care including tests being ordered have been discussed with the patient and sister [name] who indicate understanding and agree with the plan   Condition -stable  Roxan Hockey M.D on 05/19/2022 at 7:01 PM Go to [email or web address] -  for contact info  [facility] - Office   [phone]

## 2022-05-19 NOTE — ED Provider Notes (Signed)
[facility] Provider Note   CSN: [id number] Arrival date & time: 05/19/22  1020     History {Add pertinent medical, surgical, social history, OB history to HPI:1} No chief complaint on file.   Pam Sanders is a 72 y.o. female.  72 year old female with a history of intellectual disability, seizure disorder on AEDs, ventriculomegaly, CKD, hypothyroidism, and schizoaffective disorder who presents to the emergency department with altered mental status.  Patient alert and oriented to self and year.  However, likely due to baseline intellectual function additional history is limited.  Reports that she is not currently in any pain.  Per EMS, had a fall on Friday into her bathtub and has been complaining of upper back pain since then.  Per patient's family she has been more drowsy than usual and is unable to stand.  Family members were concerned about possible stroke or brain bleed so 911 was called.       Home Medications Prior to Admission medications   Medication Sig Start Date End Date Taking? Authorizing Provider  alum & mag hydroxide-simeth (MAALOX/MYLANTA) 200-200-20 MG/5ML suspension Take 30 mLs by mouth every 6 (six) hours as needed for indigestion or heartburn.    [provider]  atorvastatin (LIPITOR) 10 MG tablet TAKE ONE TABLET ONCE DAILY 12/18/21   Loman Brooklyn, FNP  calcium carbonate (OSCAL) 1500 (600 Ca) MG TABS tablet Take 1,200 mg by mouth daily.    [provider]  cetirizine (ZYRTEC) 10 MG tablet TAKE 1 TABLET DAILY FOR ALLERGY SYMPTOMS 12/27/21   Loman Brooklyn, FNP  Cholecalciferol (VITAMIN D-3) 1000 UNITS CAPS Take 1,000 Units by mouth daily.     [provider]  Clotrimazole 1 % OINT Apply 1 application topically 2 (two) times daily. 01/12/19   Loman Brooklyn, FNP  famotidine (PEPCID) 20 MG tablet Take 1 tablet (20 mg total) by mouth at bedtime. (NEEDS TO BE SEEN BEFORE NEXT REFILL) 05/06/22    [name], FNP  fluticasone [facility]) 50 MCG/ACT nasal spray USE 2 SPRAYS INTO EACH NOSTRIL ONCE DAILY AT BEDTIME 08/19/21   Loman Brooklyn, FNP  furosemide (LASIX) 20 MG tablet Take 1 tablet (20 mg total) by mouth daily. (NEEDS TO BE SEEN BEFORE NEXT REFILL) 05/06/22   [name], FNP  lamoTRIgine (LAMICTAL) 100 MG tablet One qam, 2qhs 11/28/21   Marcial Pacas, MD  levothyroxine (SYNTHROID) 150 MCG tablet Take 1 tablet (150 mcg total) by mouth daily before breakfast. 10/01/21   Loman Brooklyn, FNP  LINZESS 72 MCG capsule TAKE 1 CAPSULE DAILY BEFORE BREAKFAST 02/27/20   Mahala Menghini, PA-C  loratadine (CLARITIN) 10 MG tablet Take 1 tablet (10 mg total) by mouth daily. 09/06/19   Loman Brooklyn, FNP  losartan (COZAAR) 25 MG tablet Take 1 tablet (25 mg total) by mouth daily. (NEEDS TO BE SEEN BEFORE NEXT REFILL) 05/06/22   [name], FNP  Melatonin 5 MG CAPS Take 5 mg by mouth at bedtime.    [provider]  Multiple Vitamin (MULTIVITAMIN WITH MINERALS) TABS tablet Take 1 tablet by mouth daily.    [provider]  Olopatadine HCl 0.2 % SOLN Apply 1 drop to eye daily. 12/23/18   Loman Brooklyn, FNP  Omega-3 Fatty Acids (FISH OIL) 1000 MG CAPS Take 1 capsule by mouth daily.     [provider]  omeprazole (PRILOSEC) 20 MG capsule TAKE 1 CAPSULE 2 TIMES A DAY  BEFORE A MEAL 12/18/21   Hendricks Limes F, FNP  potassium chloride SA (KLOR-CON M) 20 MEQ tablet Take 1 tablet (20 mEq total) by mouth 2 (two) times daily. (NEEDS TO BE SEEN BEFORE NEXT REFILL) 05/06/22   [name], FNP  sertraline (ZOLOFT) 50 MG tablet Take 1 tablet (50 mg total) by mouth daily. (NEEDS TO BE SEEN BEFORE NEXT REFILL) 05/06/22   [name], FNP  spironolactone (ALDACTONE) 25 MG tablet Take 1 tablet (25 mg total) by mouth daily. (NEEDS TO BE SEEN BEFORE NEXT REFILL) 05/06/22   [name], FNP  thioridazine (MELLARIL) 50 MG tablet Take 1 tablet (50 mg total) by mouth  at bedtime. (NEEDS TO BE SEEN BEFORE NEXT REFILL) 05/06/22   [name], FNP      Allergies    Patient has no known allergies.    Review of Systems   Review of Systems  Physical Exam Updated Vital Signs There were no vitals taken for this visit. Physical Exam Vitals and nursing note reviewed.  Constitutional:      General: She is not in acute distress.    Appearance: She is well-developed.     Comments: Slurred speech unclear if at baseline  HENT:     Head: Normocephalic and atraumatic.     Right Ear: External ear normal.     Left Ear: External ear normal.     Nose: Nose normal.  Eyes:     Extraocular Movements: Extraocular movements intact.     Conjunctiva/sclera: Conjunctivae normal.     Pupils: Pupils are equal, round, and reactive to light.  Cardiovascular:     Rate and Rhythm: Tachycardia present. Rhythm irregular.     Heart sounds: No murmur heard. Pulmonary:     Effort: Pulmonary effort is normal. No respiratory distress.     Breath sounds: Normal breath sounds.  Abdominal:     General: Abdomen is flat. There is no distension.     Palpations: Abdomen is soft. There is no mass.     Tenderness: There is no abdominal tenderness. There is no guarding.  Musculoskeletal:     Cervical back: Normal range of motion and neck supple.     Right lower leg: No edema.     Left lower leg: No edema.  Skin:    General: Skin is warm and dry.     Comments: Scattered bruises.  Large bruise over right upper back that overlies scapula.  Bruise on right hip as well.  Neurological:     General: No focal deficit present.     Mental Status: She is alert. Mental status is at baseline.     Comments: Moves all 4 extremities.  Alert and conversant.  Psychiatric:        Mood and Affect: Mood normal.     ED Results / Procedures / Treatments   Labs (all labs ordered are listed, but only abnormal results are displayed) Labs Reviewed - No data to display  EKG None  Radiology No  results found.  Procedures Procedures  {Document cardiac monitor, telemetry assessment procedure when appropriate:1}  Medications Ordered in ED Medications - No data to display  ED Course/ Medical Decision Making/ A&P   {   Click here for ABCD2, HEART and other calculatorsREFRESH Note before signing :1}                          Medical Decision Making Amount and/or Complexity  of Data Reviewed Labs: ordered. Radiology: ordered.  Risk Prescription drug management.   ***  {Document critical care time when appropriate:1} {Document review of labs and clinical decision tools ie heart score, Chads2Vasc2 etc:1}  {Document your independent review of radiology images, and any outside records:1} {Document your discussion with family members, caretakers, and with consultants:1} {Document social determinants of health affecting pt's care:1} {Document your decision making why or why not admission, treatments were needed:1} Final Clinical Impression(s) / ED Diagnoses Final diagnoses:  None    Rx / DC Orders ED Discharge Orders     None

## 2022-05-19 NOTE — Telephone Encounter (Signed)
Patients sister reports patient fell in her bathtub on Friday. EMS had to come and get her up but did not take her to ER. Reports patients condition has deteriorated over the weekend. She is unable to stand, lethargic and unable to stay awake, and drooling. Sister reports she is afraid she has a brain bleed or has had a stroke. Unable to get her to vehicle.   Advised sister to call EMS and have patient taken to ER for eval. She is agreeable.

## 2022-05-20 ENCOUNTER — Inpatient Hospital Stay (HOSPITAL_COMMUNITY): Payer: 59

## 2022-05-20 DIAGNOSIS — E871 Hypo-osmolality and hyponatremia: Secondary | ICD-10-CM | POA: Diagnosis not present

## 2022-05-20 LAB — COMPREHENSIVE METABOLIC PANEL
ALT: 108 U/L — ABNORMAL HIGH (ref 0–44)
AST: 150 U/L — ABNORMAL HIGH (ref 15–41)
Albumin: 3 g/dL — ABNORMAL LOW (ref 3.5–5.0)
Alkaline Phosphatase: 96 U/L (ref 38–126)
Anion gap: 9 (ref 5–15)
BUN: 22 mg/dL (ref 8–23)
CO2: 18 mmol/L — ABNORMAL LOW (ref 22–32)
Calcium: 8.2 mg/dL — ABNORMAL LOW (ref 8.9–10.3)
Chloride: 93 mmol/L — ABNORMAL LOW (ref 98–111)
Creatinine, Ser: 1.02 mg/dL — ABNORMAL HIGH (ref 0.44–1.00)
GFR, Estimated: 59 mL/min — ABNORMAL LOW (ref >60)
Glucose, Bld: 118 mg/dL — ABNORMAL HIGH (ref 70–99)
Potassium: 4.2 mmol/L (ref 3.5–5.1)
Sodium: 120 mmol/L — ABNORMAL LOW (ref 135–145)
Total Bilirubin: 0.7 mg/dL (ref 0.3–1.2)
Total Protein: 5.8 g/dL — ABNORMAL LOW (ref 6.5–8.1)

## 2022-05-20 LAB — SODIUM: Sodium: 120 mmol/L — ABNORMAL LOW (ref 135–145)

## 2022-05-20 MED ORDER — POTASSIUM CHLORIDE CRYS ER 20 MEQ PO TBCR
40.0000 meq | EXTENDED_RELEASE_TABLET | ORAL | Status: AC
  Administered 2022-05-20 (×2): 40 meq via ORAL
  Filled 2022-05-20 (×2): qty 2

## 2022-05-20 MED ORDER — PROCHLORPERAZINE EDISYLATE 10 MG/2ML IJ SOLN
10.0000 mg | Freq: Once | INTRAMUSCULAR | Status: AC
  Administered 2022-05-20: 10 mg via INTRAVENOUS
  Filled 2022-05-20: qty 2

## 2022-05-20 MED ORDER — POTASSIUM CHLORIDE 10 MEQ/100ML IV SOLN
10.0000 meq | INTRAVENOUS | Status: AC
  Administered 2022-05-20 (×4): 10 meq via INTRAVENOUS
  Filled 2022-05-20 (×4): qty 100

## 2022-05-20 MED ORDER — MORPHINE SULFATE (PF) 2 MG/ML IV SOLN
2.0000 mg | INTRAVENOUS | Status: DC | PRN
  Administered 2022-05-20: 2 mg via INTRAVENOUS
  Filled 2022-05-20: qty 1

## 2022-05-20 MED ORDER — SODIUM CHLORIDE 0.9 % IV SOLN: INTRAVENOUS | Status: DC

## 2022-05-20 NOTE — TOC Initial Note (Signed)
Transition of Care [facility]) - Initial/Assessment Note    Patient Details  Name: Pam Sanders MRN: [id number] Date of Birth: 06-May-1950  [facility] CM/SW Contact:    Shade Flood, LCSW Phone Number: 05/20/2022, 12:52 PM  Clinical Narrative:                  Pt admitted from home. Per MD, she lives in an independent living senior apartment. Pt's sister told MD that she thinks pt will need SNF rehab at dc. MD planning to order PT when pt stable.  TOC will follow and further assess once PT recommendations made.   Expected Discharge Plan: Skilled Nursing Facility Barriers to Discharge: Continued Medical Work up   Patient Goals and CMS Choice            Expected Discharge Plan and Services In-house Referral: Clinical Social Work     Living arrangements for the past 2 months: Apartment                                      Prior Living Arrangements/Services Living arrangements for the past 2 months: Apartment   Patient language and need for interpreter reviewed:: Yes Do you feel safe going back to the place where you live?: Yes      Need for Family Participation in Patient Care: No (Comment)   Current home services: DME Criminal Activity/Legal Involvement Pertinent to Current Situation/Hospitalization: No - Comment as needed  Activities of Daily Living Home Assistive Devices/Equipment: Walker (specify type), Dentures (specify type), Eyeglasses ADL Screening (condition at time of admission) Patient's cognitive ability adequate to safely complete daily activities?: No Is the patient deaf or have difficulty hearing?: Yes Does the patient have difficulty seeing, even when wearing glasses/contacts?: No Does the patient have difficulty concentrating, remembering, or making decisions?: Yes Patient able to express need for assistance with ADLs?: No Does the patient have difficulty dressing or bathing?: Yes Independently performs ADLs?:  No Communication: Independent Dressing (OT): Needs assistance Is this a change from baseline?: Change from baseline, expected to last <3days Grooming: Needs assistance Is this a change from baseline?: Change from baseline, expected to last <3 days Feeding: Needs assistance Is this a change from baseline?: Change from baseline, expected to last <3 days Bathing: Needs assistance Is this a change from baseline?: Change from baseline, expected to last <3 days Toileting: Needs assistance Is this a change from baseline?: Change from baseline, expected to last <3 days In/Out Bed: Needs assistance Is this a change from baseline?: Change from baseline, expected to last <3 days Walks in Home: Independent with device (comment) Does the patient have difficulty walking or climbing stairs?: Yes Weakness of Legs: Both Weakness of Arms/Hands: Both  Permission Sought/Granted                  Emotional Assessment       Orientation: : Oriented to Self, Oriented to Place, Oriented to  Time, Oriented to Situation Alcohol / Substance Use: Not Applicable Psych Involvement: No (comment)  Admission diagnosis:  Hyponatremia [E87.1] Patient Active Problem List   Diagnosis Date Noted   Hyponatremia 05/19/2022   Prolonged Q-T interval on ECG 04/03/2022   History of long-term treatment with high-risk medication 04/03/2022   Agitation 11/28/2021   Osteopenia 09/29/2020   Lower extremity edema    Constipation 11/03/2018   Difficulty sleeping 10/29/2018   Allergic rhinitis  10/28/2018   Conductive hearing loss 10/28/2018   Degeneration of intervertebral disc 10/28/2018   Intellectual disability 10/28/2018   Tremor 06/04/2016   Primary hypertension 04/05/2016   Cerebral ventriculomegaly/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 ventriculomegaly) 08/14/2015   CKD (chronic kidney disease) stage 3, GFR 30-59 ml/min (HCC) 08/14/2015   Gait  instability 08/14/2015   Macrocytic anemia 08/14/2015   Recurrent falls 08/14/2015   Dyslipidemia 03/29/2015   Gastroesophageal reflux disease without esophagitis 09/05/2014   Schizo-affective schizophrenia, chronic condition ([place]) 09/05/2014   Hypothyroidism 03/06/2014   Obesity (BMI 30-39.9) 03/06/2014   Peripheral vascular disease ([place]) 09/22/2013   Seizure disorder ([place]) 01/05/2013   Epileptic grand mal status ([place]) 08/03/1999   PCP:  [name], FNP Pharmacy:   [facility] [address] [place] [address] Phone: [phone] Fax: [phone]     Social Determinants of Health (SDOH) Social History: [place]: No Food Insecurity (03/04/2021)  Housing: Low Risk  (03/04/2021)  Transportation Needs: No Transportation Needs (03/04/2021)  Alcohol Screen: Low Risk  (03/04/2021)  Depression (PHQ2-9): Low Risk  (04/03/2022)  Financial Resource Strain: Low Risk  (03/04/2021)  Physical Activity: Inactive (03/04/2021)  Social Connections: Socially Isolated (03/04/2021)  Stress: No Stress Concern Present (03/04/2021)  Tobacco Use: Low Risk  (05/19/2022)   SDOH Interventions:     Readmission Risk Interventions     No data to display

## 2022-05-20 NOTE — Progress Notes (Signed)
PROGRESS NOTE     Pam Sanders, is a 72 y.o. female, DOB - 09-06-1950, [id number]  Admit date - 05/19/2022   Admitting Physician Aysa Larivee Denton Brick, MD  Outpatient Primary MD for the patient is [name], FNP  LOS - 1  Chief Complaint  Patient presents with   Fall        Brief Narrative:   72 y.o. female with past medical history relevant for history of seizures and intellectual disability in the setting of cerebral ventriculomegaly, GERD, hypothyroidism, schizophrenia and HTN admitted on 05/19/2022 with falls and weakness/lethargy and altered mentation and found to have severe hyponatremia with a serum sodium of 114    -Assessment and Plan:  1)Symptomatic Severe Hyponatremia----factorial in the setting of recent stomach bug with vomiting or diarrhea, compounded by Lasix and Aldactone use as well as poor oral intake sodium of 114 (sodium was 141 on 10/01/2021) -Patient with metabolic encephalopathy, gait problems- 05/20/22 -Sodium is up to 120 from 114 -Okay to stop 3% saline solution -Okay to use normal saline for now -Continue to check serial BMP/sodium levels   2)Elevated LFTs--- - 05/20/22 -LFTs remain elevated -Check right upper quadrant ultrasound  3)New onset A-fib with RVR----IV metoprolol as ordered with parameters- -EKG is consistent with A-fib with RVR -echo requested -Elevated TSH noted   4)Recurrent Falls/Ambulatory Dysfunction and weakness--- due to severe hyponatremia -CT head without acute findings but shows 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 ventriculomegaly) -CT C-spine without acute findings -Need PT eval prior to discharge   5)Possible right greater trochanter fracture--- Hip and pelvic x-rays with possible nondisplaced fracture of the greater trochanter on the right -Anticipate conservative management -Get PT eval -Consider repeat dedicated right hip x-rays prior to discharge    6)Hypothyroidism----TSH is 10.2 -Free T4 at 1.07 -Increased levothyroxine to 200 mcg daily from 150 mcg PTA   7) CKD 3A with hyperkalemia---  05/20/22 -Creatinine trending down with hydration  -Potassium normalized after Lokelma - renally adjust medications, avoid nephrotoxic agents / dehydration  / hypotension   8) history of seizures-- history of seizures and intellectual disability in the setting of cerebral ventriculomegaly- -no recent seizures -Not on antiepileptic agents PTA -Monitor closely especially with severe hyponatremia and risk for seizures   9)Possible UTI--- continue Rocephin pending culture data  Status is: Inpatient   Disposition: The patient is from: Home              Anticipated d/c is to: SNF              Anticipated d/c date is: 3 days              Patient currently is not medically stable to d/c. Barriers: Not Clinically Stable-   Code Status :  -  Code Status: Full Code   Family Communication: Discussed with sister [name]  DVT Prophylaxis  :   - SCDs   heparin injection 5,000 Units Start: 05/19/22 2200 SCDs Start: 05/19/22 1521 Place TED hose Start: 05/19/22 1521 Place TED hose Start: 05/19/22 1515   Lab Results  Component Value Date   PLT 226 05/19/2022   Inpatient Medications  Scheduled Meds:  calcium carbonate  1,250 mg Oral Q breakfast   Chlorhexidine Gluconate Cloth  6 each Topical Daily   cholecalciferol  1,000 Units Oral Daily   famotidine  20 mg Oral QHS   heparin  5,000 Units Subcutaneous Q8H   levothyroxine  200 mcg Oral QAC breakfast  metoprolol tartrate  5 mg Intravenous Q8H   multivitamin with minerals  1 tablet Oral Daily   pantoprazole  40 mg Oral Daily   sodium chloride flush  3 mL Intravenous Q12H   sodium chloride flush  3 mL Intravenous Q12H   Continuous Infusions:  sodium chloride     sodium chloride 125 mL/hr at 05/20/22 1111   cefTRIAXone (ROCEPHIN)  IV Stopped (05/19/22 2023)   PRN Meds:.sodium chloride,  acetaminophen **OR** acetaminophen, bisacodyl, mouth rinse, polyethylene glycol, sodium chloride flush, traZODone   Anti-infectives (From admission, onward)    Start     Dose/Rate Route Frequency Ordered Stop   05/19/22 2000  cefTRIAXone (ROCEPHIN) 1 g in sodium chloride 0.9 % 100 mL IVPB        1 g 200 mL/hr over 30 Minutes Intravenous Every 24 hours 05/19/22 1853         Subjective: Odetta Pink today has no fevers, no emesis,  No chest pain,   -less Lethargic -Oral intake improving -Voiding okay   Objective: Vitals:   05/20/22 1100 05/20/22 1103 05/20/22 1200 05/20/22 1300  BP: 105/78 105/78 (!) 113/91 101/71  Pulse: (!) 107 76 (!) 129 (!) 102  Resp: 12 16 19 $ (!) 25  Temp:  (!) 97.5 F (36.4 C)    TempSrc:  Axillary    SpO2: 96% 99% 99% 98%  Weight:      Height:        Intake/Output Summary (Last 24 hours) at 05/20/2022 1312 Last data filed at 05/20/2022 1247 Gross per 24 hour  Intake 2949.84 ml  Output 1250 ml  Net 1699.84 ml   Filed Weights   05/19/22 1038 05/19/22 1400  Weight: 82.5 kg 97.4 kg    Physical Exam  Gen:-More awake, more interactive HEENT:- [place].AT, No sclera icterus Neck-Supple Neck,No JVD,.  Lungs-  CTAB , fair symmetrical air movement CV- S1, S2 normal, regular  Abd-  +ve B.Sounds, Abd Soft, No tenderness,    Extremity/Skin:- +ve  edema, pedal pulses present  Psych-affect is flat oriented x 1, less lethargic Neuro-generalized weakness, no new focal deficits, no tremors  Data Reviewed: I have personally reviewed following labs and imaging studies  CBC: Recent Labs  Lab 05/19/22 1030  WBC 10.0  NEUTROABS 7.7  HGB 11.4*  HCT 32.8*  MCV 95.9  PLT [id number]   Basic Metabolic Panel: Recent Labs  Lab 05/19/22 1030 05/19/22 1551 05/19/22 1758 05/19/22 2132 05/20/22 0202 05/20/22 0559  NA 114* 114* 114* 116* 120* 120*  K 5.3* 5.1  --   --   --  4.2  CL 82* 85*  --   --   --  93*  CO2 23 22  --   --   --  18*  GLUCOSE 101* 97  --    --   --  118*  BUN 21 21  --   --   --  22  CREATININE 1.27* 1.25*  --   --   --  1.02*  CALCIUM 9.2 9.0  --   --   --  8.2*   GFR: Estimated Creatinine Clearance: 48.5 mL/min (A) (by C-G formula based on SCr of 1.02 mg/dL (H)). Liver Function Tests: Recent Labs  Lab 05/19/22 1030 05/20/22 0559  AST 129* 150*  ALT 102* 108*  ALKPHOS 85 96  BILITOT 0.7 0.7  PROT 7.1 5.8*  ALBUMIN 3.8 3.0*   Recent Results (from the past 240 hour(s))  MRSA Next Gen by PCR, Nasal  Status: None   Collection Time: 05/19/22  2:00 PM   Specimen: Nasal Mucosa; Nasal Swab  Result Value Ref Range Status   MRSA by PCR Next Gen NOT DETECTED NOT DETECTED Final    Comment: (NOTE) The GeneXpert MRSA Assay (FDA approved for NASAL specimens only), is one component of a comprehensive MRSA colonization surveillance program. It is not intended to diagnose MRSA infection nor to guide or monitor treatment for MRSA infections. Test performance is not FDA approved in patients less than 76 years old. Performed at [facility], [address], [place], [place] [address]   Resp panel by RT-PCR (RSV, Flu A&B, Covid) Anterior Nasal Swab     Status: None   Collection Time: 05/19/22  2:10 PM   Specimen: Anterior Nasal Swab  Result Value Ref Range Status   SARS Coronavirus 2 by RT PCR NEGATIVE NEGATIVE Final    Comment: (NOTE) SARS-CoV-2 target nucleic acids are NOT DETECTED.  The SARS-CoV-2 RNA is generally detectable in upper respiratory specimens during the acute phase of infection. The lowest concentration of SARS-CoV-2 viral copies this assay can detect is 138 copies/mL. A negative result does not preclude SARS-Cov-2 infection and should not be used as the sole basis for treatment or other patient management decisions. A negative result may occur with  improper specimen collection/handling, submission of specimen other than nasopharyngeal swab, presence of viral mutation(s) within the areas targeted by this  assay, and inadequate number of viral copies(<138 copies/mL). A negative result must be combined with clinical observations, patient history, and epidemiological information. The expected result is Negative.  Fact Sheet for Patients:  EntrepreneurPulse.com.au  Fact Sheet for Healthcare Providers:  IncredibleEmployment.be  This test is no t yet approved or cleared by the [place] FDA and  has been authorized for detection and/or diagnosis of SARS-CoV-2 by FDA under an Emergency Use Authorization (EUA). This EUA will remain  in effect (meaning this test can be used) for the duration of the COVID-19 declaration under Section 564(b)(1) of the Act, 21 U.S.C.section 360bbb-3(b)(1), unless the authorization is terminated  or revoked sooner.       Influenza A by PCR NEGATIVE NEGATIVE Final   Influenza B by PCR NEGATIVE NEGATIVE Final    Comment: (NOTE) The Xpert Xpress SARS-CoV-2/FLU/RSV plus assay is intended as an aid in the diagnosis of influenza from Nasopharyngeal swab specimens and should not be used as a sole basis for treatment. Nasal washings and aspirates are unacceptable for Xpert Xpress SARS-CoV-2/FLU/RSV testing.  Fact Sheet for Patients: EntrepreneurPulse.com.au  Fact Sheet for Healthcare Providers: IncredibleEmployment.be  This test is not yet approved or cleared by the [place] FDA and has been authorized for detection and/or diagnosis of SARS-CoV-2 by FDA under an Emergency Use Authorization (EUA). This EUA will remain in effect (meaning this test can be used) for the duration of the COVID-19 declaration under Section 564(b)(1) of the Act, 21 U.S.C. section 360bbb-3(b)(1), unless the authorization is terminated or revoked.     Resp Syncytial Virus by PCR NEGATIVE NEGATIVE Final    Comment: (NOTE) Fact Sheet for Patients: EntrepreneurPulse.com.au  Fact Sheet for  Healthcare Providers: IncredibleEmployment.be  This test is not yet approved or cleared by the [place] FDA and has been authorized for detection and/or diagnosis of SARS-CoV-2 by FDA under an Emergency Use Authorization (EUA). This EUA will remain in effect (meaning this test can be used) for the duration of the COVID-19 declaration under Section 564(b)(1) of the Act, 21 U.S.C. section  360bbb-3(b)(1), unless the authorization is terminated or revoked.  Performed at [facility], [address], [place], [place] [address]     Radiology Studies: DG Chest 2 View  Result Date: 05/19/2022 CLINICAL DATA:  ams, fall with bruise to R upper back EXAM: CHEST - 2 VIEW COMPARISON:  Radiograph 04/03/2022 FINDINGS: Borderline enlarged cardiac silhouette. Low low lung volumes. Trace pleural effusions. No definite airspace consolidation. Bilateral shoulder degenerative changes. Thoracic spondylosis. No evidence of acute fracture. IMPRESSION: Borderline enlarged cardiac silhouette. Low lung volumes. Trace pleural effusions. No definite airspace consolidation. No evidence of acute osseous abnormality. Electronically Signed   By: Maurine Simmering M.D.   On: 05/19/2022 11:42   DG HIP UNILAT WITH PELVIS 2-3 VIEWS RIGHT  Result Date: 05/19/2022 CLINICAL DATA:  Bruising, fall EXAM: DG HIP (WITH OR WITHOUT PELVIS) 2-3V RIGHT COMPARISON:  Radiograph 08/29/2012 FINDINGS: There is cortical irregularity of the greater trochanter. No evidence of femoral neck fracture. There is mild osteoarthritis of the hips. IMPRESSION: Cortical irregularity of the greater trochanter, could represent a nondisplaced fracture. Consider CT. Electronically Signed   By: Maurine Simmering M.D.   On: 05/19/2022 11:28   CT CERVICAL SPINE WO CONTRAST  Result Date: 05/19/2022 CLINICAL DATA:  72 year old female status post fall in bathtub over the weekend. EXAM: CT CERVICAL SPINE WITHOUT CONTRAST TECHNIQUE: Multidetector CT imaging of  the cervical spine was performed without intravenous contrast. Multiplanar CT image reconstructions were also generated. RADIATION DOSE REDUCTION: This exam was performed according to the departmental dose-optimization program which includes automated exposure control, adjustment of the mA and/or kV according to patient size and/or use of iterative reconstruction technique. COMPARISON:  Head CT today.  Cervical spine CT 04/28/2016. FINDINGS: Alignment: Mildly improved cervical lordosis compared to 2018. Chronic degenerative appearing mild anterolisthesis of C4 on C5 has not significantly changed. Cervicothoracic junction alignment is within normal limits. Bilateral posterior element alignment is within normal limits. Skull base and vertebrae: Visualized skull base is intact. No atlanto-occipital dissociation. C1 and C2 appear intact and aligned. No acute osseous abnormality identified. Soft tissues and spinal canal: No prevertebral fluid or swelling. No visible canal hematoma. Mild motion artifact. Disc levels: Chronic C2 through C3 posterior element and/or interbody ankylosis. Associated chronic C4-C5 spondylolisthesis and facet arthropathy which is greater on the right. And chronic disc and endplate degeneration [id number] and C6-C7. However, fairly capacious spinal canal. Doubt spinal stenosis. Upper chest: T1 inferior endplate deformity most resembling a Schmorl's node is present in 2018 but is larger. Evidence of a layering right pleural effusion. Otherwise negative lung apices. IMPRESSION: 1. No acute traumatic injury identified in the cervical spine. 2. Chronic cervical spine ankylosis and degeneration but no CT evidence of spinal stenosis. 3. Layering right pleural effusion visible in the upper chest. Electronically Signed   By: Genevie Ann M.D.   On: 05/19/2022 11:09   CT HEAD WO CONTRAST  Result Date: 05/19/2022 CLINICAL DATA:  72 year old female status post fall in bathtub over the weekend. EXAM: CT HEAD  WITHOUT CONTRAST TECHNIQUE: Contiguous axial images were obtained from the base of the skull through the vertex without intravenous contrast. RADIATION DOSE REDUCTION: This exam was performed according to the departmental dose-optimization program which includes automated exposure control, adjustment of the mA and/or kV according to patient size and/or use of iterative reconstruction technique. COMPARISON:  Head CT 11/01/2019. FINDINGS: Brain: Chronic colpocephaly, and asymmetric atrophy or encephalomalacia in the left occipital lobe. This is associated with chronic coarse calcified dural thickening and  posterior midline shift toward the smaller hemisphere. Chronic cerebellar volume loss also which is more pronounced on the right. No evidence of transependymal edema. Basilar cisterns remain patent. No superimposed No midline shift, ventriculomegaly, mass effect, evidence of mass lesion, intracranial hemorrhage or evidence of cortically based acute infarction. Vascular: Calcified atherosclerosis at the skull base. No suspicious intracranial vascular hyperdensity. Skull: No acute osseous abnormality identified. Chronic hyperostosis. Sinuses/Orbits: Visualized paranasal sinuses and mastoids are clear. Other: No acute orbit or scalp soft tissue injury identified. IMPRESSION: 1. No acute intracranial abnormality or acute traumatic injury identified. 2. Chronic and likely 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, colpocephaly. Coarse dural calcification and hyperostosis of calvarium. Electronically Signed   By: Genevie Ann M.D.   On: 05/19/2022 11:06    Scheduled Meds:  calcium carbonate  1,250 mg Oral Q breakfast   Chlorhexidine Gluconate Cloth  6 each Topical Daily   cholecalciferol  1,000 Units Oral Daily   famotidine  20 mg Oral QHS   heparin  5,000 Units Subcutaneous Q8H   levothyroxine  200 mcg Oral QAC breakfast   metoprolol tartrate  5 mg Intravenous Q8H   multivitamin with minerals  1  tablet Oral Daily   pantoprazole  40 mg Oral Daily   sodium chloride flush  3 mL Intravenous Q12H   sodium chloride flush  3 mL Intravenous Q12H   Continuous Infusions:  sodium chloride     sodium chloride 125 mL/hr at 05/20/22 1111   cefTRIAXone (ROCEPHIN)  IV Stopped (05/19/22 2023)     LOS: 1 day    Roxan Hockey M.D on 05/20/2022 at 1:12 PM  Go to [email or web address] - for contact info  [facility] - Office  [phone]  If 7PM-7AM, please contact night-coverage [email or web address] 05/20/2022, 1:12 PM

## 2022-05-21 ENCOUNTER — Encounter (HOSPITAL_COMMUNITY): Payer: Self-pay | Admitting: Family Medicine

## 2022-05-21 ENCOUNTER — Inpatient Hospital Stay (HOSPITAL_COMMUNITY): Payer: 59

## 2022-05-21 DIAGNOSIS — G40909 Epilepsy, unspecified, not intractable, without status epilepticus: Secondary | ICD-10-CM | POA: Diagnosis not present

## 2022-05-21 DIAGNOSIS — I10 Essential (primary) hypertension: Secondary | ICD-10-CM

## 2022-05-21 DIAGNOSIS — E871 Hypo-osmolality and hyponatremia: Secondary | ICD-10-CM | POA: Diagnosis not present

## 2022-05-21 DIAGNOSIS — I4891 Unspecified atrial fibrillation: Secondary | ICD-10-CM

## 2022-05-21 DIAGNOSIS — K219 Gastro-esophageal reflux disease without esophagitis: Secondary | ICD-10-CM | POA: Diagnosis not present

## 2022-05-21 LAB — CBC
HCT: 35.8 % — ABNORMAL LOW (ref 36.0–46.0)
Hemoglobin: 11.6 g/dL — ABNORMAL LOW (ref 12.0–15.0)
MCH: 32.9 pg (ref 26.0–34.0)
MCHC: 32.4 g/dL (ref 30.0–36.0)
MCV: 101.4 fL — ABNORMAL HIGH (ref 80.0–100.0)
Platelets: 190 10*3/uL (ref 150–400)
RBC: 3.53 MIL/uL — ABNORMAL LOW (ref 3.87–5.11)
RDW: 15.6 % — ABNORMAL HIGH (ref 11.5–15.5)
WBC: 10 10*3/uL (ref 4.0–10.5)
nRBC: 0.2 % (ref 0.0–0.2)

## 2022-05-21 LAB — RENAL FUNCTION PANEL
Albumin: 2.1 g/dL — ABNORMAL LOW (ref 3.5–5.0)
Anion gap: 6 (ref 5–15)
BUN: 19 mg/dL (ref 8–23)
CO2: 15 mmol/L — ABNORMAL LOW (ref 22–32)
Calcium: 5.9 mg/dL — CL (ref 8.9–10.3)
Chloride: 107 mmol/L (ref 98–111)
Creatinine, Ser: 0.68 mg/dL (ref 0.44–1.00)
GFR, Estimated: >60 mL/min (ref >60)
Glucose, Bld: 76 mg/dL (ref 70–99)
Phosphorus: 2.7 mg/dL (ref 2.5–4.6)
Potassium: 2.9 mmol/L — ABNORMAL LOW (ref 3.5–5.1)
Sodium: 128 mmol/L — ABNORMAL LOW (ref 135–145)

## 2022-05-21 LAB — MAGNESIUM: Magnesium: 2.1 mg/dL (ref 1.7–2.4)

## 2022-05-21 LAB — COMPREHENSIVE METABOLIC PANEL WITH GFR
ALT: 168 U/L — ABNORMAL HIGH (ref 0–44)
AST: 261 U/L — ABNORMAL HIGH (ref 15–41)
Albumin: 3.1 g/dL — ABNORMAL LOW (ref 3.5–5.0)
Alkaline Phosphatase: 109 U/L (ref 38–126)
Anion gap: 8 (ref 5–15)
BUN: 21 mg/dL (ref 8–23)
CO2: 18 mmol/L — ABNORMAL LOW (ref 22–32)
Calcium: 8.2 mg/dL — ABNORMAL LOW (ref 8.9–10.3)
Chloride: 100 mmol/L (ref 98–111)
Creatinine, Ser: 1 mg/dL (ref 0.44–1.00)
GFR, Estimated: >60 mL/min
Glucose, Bld: 78 mg/dL (ref 70–99)
Potassium: 5.3 mmol/L — ABNORMAL HIGH (ref 3.5–5.1)
Sodium: 126 mmol/L — ABNORMAL LOW (ref 135–145)
Total Bilirubin: 0.8 mg/dL (ref 0.3–1.2)
Total Protein: 6 g/dL — ABNORMAL LOW (ref 6.5–8.1)

## 2022-05-21 LAB — BASIC METABOLIC PANEL
Anion gap: 10 (ref 5–15)
BUN: 23 mg/dL (ref 8–23)
CO2: 19 mmol/L — ABNORMAL LOW (ref 22–32)
Calcium: 8.3 mg/dL — ABNORMAL LOW (ref 8.9–10.3)
Chloride: 98 mmol/L (ref 98–111)
Creatinine, Ser: 1.12 mg/dL — ABNORMAL HIGH (ref 0.44–1.00)
GFR, Estimated: 53 mL/min — ABNORMAL LOW (ref >60)
Glucose, Bld: 109 mg/dL — ABNORMAL HIGH (ref 70–99)
Potassium: 4.6 mmol/L (ref 3.5–5.1)
Sodium: 127 mmol/L — ABNORMAL LOW (ref 135–145)

## 2022-05-21 LAB — URINE CULTURE: Culture: NO GROWTH

## 2022-05-21 LAB — TROPONIN I (HIGH SENSITIVITY): Troponin I (High Sensitivity): 14 ng/L (ref <18)

## 2022-05-21 MED ORDER — AMIODARONE HCL IN DEXTROSE 360-4.14 MG/200ML-% IV SOLN
60.0000 mg/h | INTRAVENOUS | Status: AC
  Administered 2022-05-21 – 2022-05-22 (×2): 60 mg/h via INTRAVENOUS
  Filled 2022-05-21: qty 200

## 2022-05-21 MED ORDER — SODIUM CHLORIDE 0.9 % IV BOLUS
250.0000 mL | Freq: Once | INTRAVENOUS | Status: AC
  Administered 2022-05-21: 250 mL via INTRAVENOUS

## 2022-05-21 MED ORDER — METOPROLOL TARTRATE 5 MG/5ML IV SOLN
5.0000 mg | Freq: Four times a day (QID) | INTRAVENOUS | Status: DC | PRN
  Administered 2022-05-21 (×2): 5 mg via INTRAVENOUS
  Filled 2022-05-21 (×2): qty 5

## 2022-05-21 MED ORDER — MORPHINE SULFATE (PF) 2 MG/ML IV SOLN
1.0000 mg | INTRAVENOUS | Status: DC | PRN
  Administered 2022-05-23 – 2022-05-25 (×4): 1 mg via INTRAVENOUS
  Filled 2022-05-21 (×5): qty 1

## 2022-05-21 MED ORDER — AMIODARONE LOAD VIA INFUSION
150.0000 mg | Freq: Once | INTRAVENOUS | Status: AC
  Administered 2022-05-21: 150 mg via INTRAVENOUS
  Filled 2022-05-21: qty 83.34

## 2022-05-21 MED ORDER — MORPHINE SULFATE (PF) 2 MG/ML IV SOLN
2.0000 mg | Freq: Once | INTRAVENOUS | Status: AC
  Administered 2022-05-21: 2 mg via INTRAVENOUS
  Filled 2022-05-21: qty 1

## 2022-05-21 MED ORDER — AMIODARONE HCL IN DEXTROSE 360-4.14 MG/200ML-% IV SOLN
30.0000 mg/h | INTRAVENOUS | Status: DC
  Administered 2022-05-22 – 2022-05-23 (×5): 30 mg/h via INTRAVENOUS
  Filled 2022-05-21 (×5): qty 200

## 2022-05-21 MED ORDER — DILTIAZEM HCL 30 MG PO TABS
30.0000 mg | ORAL_TABLET | Freq: Four times a day (QID) | ORAL | Status: DC
  Administered 2022-05-21 – 2022-05-22 (×3): 30 mg via ORAL
  Filled 2022-05-21 (×2): qty 1

## 2022-05-21 MED ORDER — LEVOTHYROXINE SODIUM 75 MCG PO TABS
150.0000 ug | ORAL_TABLET | Freq: Every day | ORAL | Status: DC
  Administered 2022-05-22 – 2022-05-26 (×5): 150 ug via ORAL
  Filled 2022-05-21 (×5): qty 2

## 2022-05-21 NOTE — Plan of Care (Signed)
  Problem: Acute Rehab PT Goals(only PT should resolve) Goal: Pt Will Go Supine/Side To Sit Outcome: Progressing Flowsheets (Taken 05/21/2022 1526) Pt will go Supine/Side to Sit:  with minimal assist  with moderate assist Goal: Patient Will Transfer Sit To/From Stand Outcome: Progressing Flowsheets (Taken 05/21/2022 1526) Patient will transfer sit to/from stand:  with minimal assist  with moderate assist Goal: Pt Will Transfer Bed To Chair/Chair To Bed Outcome: Progressing Flowsheets (Taken 05/21/2022 1526) Pt will Transfer Bed to Chair/Chair to Bed:  with min assist  with mod assist Goal: Pt Will Ambulate Outcome: Progressing Flowsheets (Taken 05/21/2022 1526) Pt will Ambulate:  15 feet  with moderate assist  with rolling walker   3:26 PM, 05/21/22 Lonell Grandchild, MPT Physical Therapist with [facility] 336 [phone] office [phone] mobile phone

## 2022-05-21 NOTE — Evaluation (Signed)
Physical Therapy Evaluation Patient Details Name: Pam Sanders MRN: [id number] DOB: October 26, 1950 Today's Date: 05/21/2022  History of Present Illness  Pam Sanders  is a 72 y.o. female with past medical history relevant for history of seizures and intellectual disability in the setting of cerebral ventriculomegaly, GERD, hypothyroidism, schizophrenia and HTN presented to the ED with falls and weakness/lethargy and altered mentation  -  Additional history obtained from patient's sister  [name] at bedside  -Apparently patient had a stomach bug with loss of emesis and a bit of diarrhea last week--  -she was taking Lasix and Aldactone  =-She became very weak and started to have falls and ambulatory dysfunction as well as very very poor oral intake over the last week or so  -Apparently no fevers or productive cough   Clinical Impression  Patient demonstrates slow labored movement for rolling to side and sitting up from side lying position with c/o severe pain in low back, once seated very unsteady with occasional falling forward, very apprehensive to attempt sit to stands due to fear of falling, able to stand using RW and limited to a few side steps at bedside before having to sit due to fatigue/generalized weakness.  Patient tolerated sitting up in chair after therapy - RN notified.  Patient will benefit from continued skilled physical therapy in hospital and recommended venue below to increase strength, balance, endurance for safe ADLs and gait.         Recommendations for follow up therapy are one component of a multi-disciplinary discharge planning process, led by the attending physician.  Recommendations may be updated based on patient status, additional functional criteria and insurance authorization.  Follow Up Recommendations Skilled nursing-short term rehab (<3 hours/day) Can patient physically be transported by private vehicle: No    Assistance Recommended at Discharge Intermittent  Supervision/Assistance  Patient can return home with the following  A lot of help with bathing/dressing/bathroom;A lot of help with walking and/or transfers;Help with stairs or ramp for entrance;Assistance with cooking/housework    Equipment Recommendations None recommended by PT  Recommendations for Other Services       Functional Status Assessment Patient has had a recent decline in their functional status and demonstrates the ability to make significant improvements in function in a reasonable and predictable amount of time.     Precautions / Restrictions Precautions Precautions: Fall Restrictions Weight Bearing Restrictions: No      Mobility  Bed Mobility Overal bed mobility: Needs Assistance Bed Mobility: Rolling, Sidelying to Sit Rolling: Mod assist Sidelying to sit: Max assist, HOB elevated       General bed mobility comments: poor tolerance for lying flat due to c/o severe low back pain, required HOB partially raised    Transfers Overall transfer level: Needs assistance Equipment used: Rolling walker (2 wheels) Transfers: Sit to/from Stand, Bed to chair/wheelchair/BSC Sit to Stand: Mod assist   Step pivot transfers: Mod assist       General transfer comment: slow labored movement, apprehensive due to fear of falling    Ambulation/Gait Ambulation/Gait assistance: Mod assist, Max assist Gait Distance (Feet): 4 Feet Assistive device: Rolling walker (2 wheels) Gait Pattern/deviations: Decreased step length - right, Decreased step length - left, Decreased stride length Gait velocity: slow     General Gait Details: limited to a few slow labored unsteady side steps before having to sit due to c/o fatigue and generalized weakness  Science writer  Modified Rankin (Stroke Patients Only)       Balance Overall balance assessment: Needs assistance Sitting-balance support: Feet supported, No upper extremity supported Sitting  balance-Leahy Scale: Poor Sitting balance - Comments: fair/poor seated at EOB   Standing balance support: Reliant on assistive device for balance, During functional activity, Bilateral upper extremity supported Standing balance-Leahy Scale: Poor Standing balance comment: fair/poor using RW                             Pertinent Vitals/Pain Pain Assessment Pain Assessment: Faces Faces Pain Scale: Hurts whole lot Pain Location: low back during bed mobility Pain Descriptors / Indicators: Sore, Grimacing, Guarding, Moaning Pain Intervention(s): Limited activity within patient's tolerance, Monitored during session, Repositioned    Home Living Family/patient expects to be discharged to:: Private residence Living Arrangements: Alone Available Help at Discharge: Family;Available PRN/intermittently;Personal care attendant Type of Home: Apartment Home Access: Level entry       Home Layout: One level Home Equipment: Rollator (4 wheels);Wheelchair - manual      Prior Function Prior Level of Function : Needs assist       Physical Assist : Mobility (physical);ADLs (physical) Mobility (physical): Bed mobility;Transfers;Gait   Mobility Comments: short household distances using Rollator, uses wheelchair for longer distances, states she sleeps in lounge chair ADLs Comments: assisted by family, home aides from 9:00 AM to 2:00 PM M-F     Hand Dominance        Extremity/Trunk Assessment   Upper Extremity Assessment Upper Extremity Assessment: Generalized weakness    Lower Extremity Assessment Lower Extremity Assessment: Generalized weakness    Cervical / Trunk Assessment Cervical / Trunk Assessment: Kyphotic  Communication      Cognition Arousal/Alertness: Awake/alert Behavior During Therapy: WFL for tasks assessed/performed Overall Cognitive Status: History of cognitive impairments - at baseline                                          General  Comments      Exercises     Assessment/Plan    PT Assessment Patient needs continued PT services  PT Problem List Decreased strength;Decreased activity tolerance;Decreased balance;Decreased mobility       PT Treatment Interventions DME instruction;Gait training;Functional mobility training;Therapeutic activities;Therapeutic exercise;Balance training;Patient/family education    PT Goals (Current goals can be found in the Care Plan section)  Acute Rehab PT Goals Patient Stated Goal: return home after rehab PT Goal Formulation: With patient Time For Goal Achievement: 06/04/22 Potential to Achieve Goals: Good    Frequency Min 3X/week     Co-evaluation               AM-PAC PT "6 Clicks" Mobility  Outcome Measure Help needed turning from your back to your side while in a flat bed without using bedrails?: A Lot Help needed moving from lying on your back to sitting on the side of a flat bed without using bedrails?: A Lot Help needed moving to and from a bed to a chair (including a wheelchair)?: A Lot Help needed standing up from a chair using your arms (e.g., wheelchair or bedside chair)?: A Lot Help needed to walk in hospital room?: A Lot Help needed climbing 3-5 steps with a railing? : Total 6 Click Score: 11    End of Session   Activity Tolerance: Patient tolerated treatment well;Patient  limited by fatigue Patient left: in chair;with call bell/phone within reach Nurse Communication: Mobility status PT Visit Diagnosis: Unsteadiness on feet (R26.81);Other abnormalities of gait and mobility (R26.89);Muscle weakness (generalized) (M62.81)    Time: 1411-1440 PT Time Calculation (min) (ACUTE ONLY): 29 min   Charges:   PT Evaluation $PT Eval Moderate Complexity: 1 Mod PT Treatments $Therapeutic Activity: 23-37 mins        3:24 PM, 05/21/22 Lonell Grandchild, MPT Physical Therapist with [facility] 336 [phone] office [phone] mobile phone

## 2022-05-21 NOTE — Consult Note (Signed)
Cardiology Consultation:   Patient ID: Pam Sanders; [id number]; 11/08/1950   Admit date: 05/19/2022 Date of Consult: 05/21/2022  Primary Care Provider: [name], FNP Primary Cardiologist: New to [facility] Primary Electrophysiologist: None   History of Present Illness:   [name] is a 72 year old woman with past medical history as outlined below currently admitted after fall at home.  Cardiology is consulted secondary to newly diagnosed atrial fibrillation with RVR.  Reportedly patient has had trouble with recent "stomach bug," experiencing intermittent emesis and diarrhea last week, also on diuretics in the form of Lasix and Aldactone at home.  At presentation she was found to be severely hyponatremic with sodium 114, possible associated UTI, and with question of nondisplaced fracture of the greater trochanter on the right by plain films.  No acute findings by neuroimaging, she does have congenital cerebral ventriculomegaly.  Atrial fibrillation treated initially with intravenous diltiazem infusion, divided dose IV Lopressor.  Overnight heart rate came under better control and her intravenous diltiazem infusion was discontinued although not replaced with oral regimen.  Heart rate is in the 140s to 150s this morning.  She is otherwise hemodynamically stable with low normal blood pressure.  She mainly complains of back pain this morning.  Details of fall not entirely clear, not certain that she had frank syncope.  She does state that she felt dizzy.  Past Medical History:  Diagnosis Date   Gait disturbance    GERD (gastroesophageal reflux disease)    HA (headache)    Hernia of abdominal wall    History of hernia surgery    Hypertension    Hypothyroidism    Intellectual disability    Lower extremity edema    Osteopenia 09/29/2020   Schizophrenia (HCC)    Seizures (HCC)    Tremor     Past Surgical History:  Procedure Laterality Date   CATARACT  EXTRACTION, BILATERAL     STOMACH SURGERY     VENTRAL HERNIA REPAIR  11/09/2020     Inpatient Medications: Scheduled Meds:  calcium carbonate  1,250 mg Oral Q breakfast   Chlorhexidine Gluconate Cloth  6 each Topical Daily   cholecalciferol  1,000 Units Oral Daily   diltiazem  30 mg Oral Q6H   famotidine  20 mg Oral QHS   heparin  5,000 Units Subcutaneous Q8H   levothyroxine  200 mcg Oral QAC breakfast   multivitamin with minerals  1 tablet Oral Daily   pantoprazole  40 mg Oral Daily   sodium chloride flush  3 mL Intravenous Q12H   sodium chloride flush  3 mL Intravenous Q12H   Continuous Infusions:  sodium chloride     sodium chloride 50 mL/hr at 05/21/22 0358   cefTRIAXone (ROCEPHIN)  IV Stopped (05/20/22 2054)   PRN Meds: sodium chloride, acetaminophen **OR** acetaminophen, bisacodyl, metoprolol tartrate, morphine injection, mouth rinse, polyethylene glycol, sodium chloride flush, traZODone  Allergies:   No Known Allergies  Social History:   Social History   Socioeconomic History   Marital status: Single    Spouse name: Not on file   Number of children: 0   Years of education: HS   Highest education level: 12th grade  Occupational History   Occupation: Disabled  Tobacco Use   Smoking status: Never   Smokeless tobacco: Never  Vaping Use   Vaping Use: Never used  Substance and Sexual Activity   Alcohol use: No   Drug use: No   Sexual activity: Not Currently  Birth control/protection: Post-menopausal  Other Topics Concern   Not on file  Social History Narrative   Patient lives at home alone. Patient has a high school education. Her sister comes in frequently to take care of medications, groceries. She now has an Aid that that comes in M-F for 16 hours a week that helps her shower and do oral care.   Caffeine - one soda daily.   Social Determinants of Health   Financial Resource Strain: Low Risk  (03/04/2021)   Overall Financial Resource Strain (CARDIA)     Difficulty of Paying Living Expenses: Not hard at all  Food Insecurity: No Food Insecurity (03/04/2021)   Hunger Vital Sign    Worried About Running Out of Food in the Last Year: Never true    Ran Out of Food in the Last Year: Never true  Transportation Needs: No Transportation Needs (03/04/2021)   PRAPARE - Hydrologist (Medical): No    Lack of Transportation (Non-Medical): No  Physical Activity: Inactive (03/04/2021)   Exercise Vital Sign    Days of Exercise per Week: 0 days    Minutes of Exercise per Session: 0 min  Stress: No Stress Concern Present (03/04/2021)   [place]    Feeling of Stress : Not at all  Social Connections: Socially Isolated (03/04/2021)   Social Connection and Isolation Panel [NHANES]    Frequency of Communication with Friends and Family: More than three times a week    Frequency of Social Gatherings with Friends and Family: More than three times a week    Attends Religious Services: Never    Marine scientist or Organizations: No    Attends Archivist Meetings: Never    Marital Status: Never married  Intimate Partner Violence: Not At Risk (03/04/2021)   Humiliation, Afraid, Rape, and Kick questionnaire    Fear of Current or Ex-Partner: No    Emotionally Abused: No    Physically Abused: No    Sexually Abused: No    Family History:   The patient's family history includes Aneurysm in her brother and sister; Diabetes in an other family member; Emphysema in her brother and brother; Heart Problems in an other family member; Heart disease in her brother; Hypertension in her sister; Other in her brother, brother, and sister; Stroke in her brother, father, mother, and another family member; Throat cancer in her brother; Thyroid cancer in her sister.  ROS:  Please see the history of present illness.  All other ROS reviewed and negative.     Physical  Exam/Data:   Vitals:   05/21/22 0200 05/21/22 0300 05/21/22 0400 05/21/22 0452  BP: 98/82 115/80 104/83   Pulse: (!) 121 (!) 144 (!) 147   Resp: (!) 24 14 (!) 28   Temp:    (!) 97.3 F (36.3 C)  TempSrc:    Oral  SpO2: 98% 98% 97%   Weight:    100.7 kg  Height:        Intake/Output Summary (Last 24 hours) at 05/21/2022 0850 Last data filed at 05/21/2022 0451 Gross per 24 hour  Intake 2704.95 ml  Output 675 ml  Net 2029.95 ml   Filed Weights   05/19/22 1038 05/19/22 1400 05/21/22 0452  Weight: 82.5 kg 97.4 kg 100.7 kg   Body mass index is 49.77 kg/m.   Gen: Patient is in no acute distress. HEENT: Conjunctiva and lids normal. Neck: Supple, no  elevated JVP or carotid bruits, no thyromegaly. Lungs: Decreased breath sounds without wheezing, nonlabored breathing at rest. Cardiac: Irregularly irregular, no S3 or significant systolic murmur, no pericardial rub. Abdomen: Soft, nontender, bowel sounds present. Extremities: Compression stockings, distal pulses 2+. Skin: Warm and dry. Musculoskeletal: No kyphosis. Neuropsychiatric: Alert and oriented x3, affect grossly appropriate.  EKG:  An ECG dated 05/19/2022 was personally reviewed today and demonstrated:  Atrial fibrillation with RVR, nonspecific T wave changes.  Telemetry:  I personally reviewed telemetry which shows atrial fibrillation.  Relevant CV Studies:  Echocardiogram 04/06/2016: - Left ventricle: The cavity size was normal. Wall thickness was    increased in a pattern of mild LVH. Systolic function was normal.    The estimated ejection fraction was in the range of 60% to 65%.    Doppler parameters are consistent with abnormal left ventricular    relaxation (grade 1 diastolic dysfunction).  - Left atrium: The atrium was moderately dilated.  - Tricuspid valve: There was mild regurgitation.   Laboratory Data:  Chemistry Recent Labs  Lab 05/20/22 0559 05/20/22 1517 05/21/22 0434  NA 120* 128* 126*  K 4.2  2.9* 5.3*  CL 93* 107 100  CO2 18* 15* 18*  GLUCOSE 118* 76 78  BUN 22 19 21  $ CREATININE 1.02* 0.68 1.00  CALCIUM 8.2* 5.9* 8.2*  GFRNONAA 59* >60 >60  ANIONGAP 9 6 8    $ Recent Labs  Lab 05/19/22 1030 05/20/22 0559 05/20/22 1517 05/21/22 0434  PROT 7.1 5.8*  --  6.0*  ALBUMIN 3.8 3.0* 2.1* 3.1*  AST 129* 150*  --  261*  ALT 102* 108*  --  168*  ALKPHOS 85 96  --  109  BILITOT 0.7 0.7  --  0.8   Hematology Recent Labs  Lab 05/19/22 1030 05/21/22 0434  WBC 10.0 10.0  RBC 3.42* 3.53*  HGB 11.4* 11.6*  HCT 32.8* 35.8*  MCV 95.9 101.4*  MCH 33.3 32.9  MCHC 34.8 32.4  RDW 14.3 15.6*  PLT 226 190   Radiology/Studies:  DG Chest 2 View  Result Date: 05/19/2022 CLINICAL DATA:  ams, fall with bruise to R upper back EXAM: CHEST - 2 VIEW COMPARISON:  Radiograph 04/03/2022 FINDINGS: Borderline enlarged cardiac silhouette. Low low lung volumes. Trace pleural effusions. No definite airspace consolidation. Bilateral shoulder degenerative changes. Thoracic spondylosis. No evidence of acute fracture. IMPRESSION: Borderline enlarged cardiac silhouette. Low lung volumes. Trace pleural effusions. No definite airspace consolidation. No evidence of acute osseous abnormality. Electronically Signed   By: Maurine Simmering M.D.   On: 05/19/2022 11:42   DG HIP UNILAT WITH PELVIS 2-3 VIEWS RIGHT  Result Date: 05/19/2022 CLINICAL DATA:  Bruising, fall EXAM: DG HIP (WITH OR WITHOUT PELVIS) 2-3V RIGHT COMPARISON:  Radiograph 08/29/2012 FINDINGS: There is cortical irregularity of the greater trochanter. No evidence of femoral neck fracture. There is mild osteoarthritis of the hips. IMPRESSION: Cortical irregularity of the greater trochanter, could represent a nondisplaced fracture. Consider CT. Electronically Signed   By: Maurine Simmering M.D.   On: 05/19/2022 11:28   CT CERVICAL SPINE WO CONTRAST  Result Date: 05/19/2022 CLINICAL DATA:  72 year old female status post fall in bathtub over the weekend. EXAM: CT  CERVICAL SPINE WITHOUT CONTRAST TECHNIQUE: Multidetector CT imaging of the cervical spine was performed without intravenous contrast. Multiplanar CT image reconstructions were also generated. RADIATION DOSE REDUCTION: This exam was performed according to the departmental dose-optimization program which includes automated exposure control, adjustment of the mA and/or kV according to  patient size and/or use of iterative reconstruction technique. COMPARISON:  Head CT today.  Cervical spine CT 04/28/2016. FINDINGS: Alignment: Mildly improved cervical lordosis compared to 2018. Chronic degenerative appearing mild anterolisthesis of C4 on C5 has not significantly changed. Cervicothoracic junction alignment is within normal limits. Bilateral posterior element alignment is within normal limits. Skull base and vertebrae: Visualized skull base is intact. No atlanto-occipital dissociation. C1 and C2 appear intact and aligned. No acute osseous abnormality identified. Soft tissues and spinal canal: No prevertebral fluid or swelling. No visible canal hematoma. Mild motion artifact. Disc levels: Chronic C2 through C3 posterior element and/or interbody ankylosis. Associated chronic C4-C5 spondylolisthesis and facet arthropathy which is greater on the right. And chronic disc and endplate degeneration [id number] and C6-C7. However, fairly capacious spinal canal. Doubt spinal stenosis. Upper chest: T1 inferior endplate deformity most resembling a Schmorl's node is present in 2018 but is larger. Evidence of a layering right pleural effusion. Otherwise negative lung apices. IMPRESSION: 1. No acute traumatic injury identified in the cervical spine. 2. Chronic cervical spine ankylosis and degeneration but no CT evidence of spinal stenosis. 3. Layering right pleural effusion visible in the upper chest. Electronically Signed   By: Genevie Ann M.D.   On: 05/19/2022 11:09   CT HEAD WO CONTRAST  Result Date: 05/19/2022 CLINICAL DATA:  73 year old  female status post fall in bathtub over the weekend. EXAM: CT HEAD WITHOUT CONTRAST TECHNIQUE: Contiguous axial images were obtained from the base of the skull through the vertex without intravenous contrast. RADIATION DOSE REDUCTION: This exam was performed according to the departmental dose-optimization program which includes automated exposure control, adjustment of the mA and/or kV according to patient size and/or use of iterative reconstruction technique. COMPARISON:  Head CT 11/01/2019. FINDINGS: Brain: Chronic colpocephaly, and asymmetric atrophy or encephalomalacia in the left occipital lobe. This is associated with chronic coarse calcified dural thickening and posterior midline shift toward the smaller hemisphere. Chronic cerebellar volume loss also which is more pronounced on the right. No evidence of transependymal edema. Basilar cisterns remain patent. No superimposed No midline shift, ventriculomegaly, mass effect, evidence of mass lesion, intracranial hemorrhage or evidence of cortically based acute infarction. Vascular: Calcified atherosclerosis at the skull base. No suspicious intracranial vascular hyperdensity. Skull: No acute osseous abnormality identified. Chronic hyperostosis. Sinuses/Orbits: Visualized paranasal sinuses and mastoids are clear. Other: No acute orbit or scalp soft tissue injury identified. IMPRESSION: 1. No acute intracranial abnormality or acute traumatic injury identified. 2. Chronic and likely 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, colpocephaly. Coarse dural calcification and hyperostosis of calvarium. Electronically Signed   By: Genevie Ann M.D.   On: 05/19/2022 11:06    Assessment and Plan:   1.  Newly diagnosed atrial fibrillation with RVR.  Patient presents following a fall with other comorbidities including severe hyponatremia, possible right trochanteric fracture, suspected UTI, baseline intellectual disability and schizophrenia, seizure disorder,  hypertension, and hypothyroidism with TSH 10.3.  CHA2DS2-VASc score is 4.  2.  Essential hypertension, on losartan as an outpatient.  Recent blood pressure low normal range (medication held for now).  Plan to initiate Cardizem 30 mg p.o. every 6 hours to replace prior intravenous diltiazem infusion.  Divided dose Lopressor to be used as needed for heart rate control and otherwise uptitrate oral Cardizem as tolerated.  Obtain echocardiogram when heart rate is reasonably controlled (ideally under 100 bpm).  She is not a good candidate for anticoagulation at this point.  Continue supportive measures and treatment of  other active comorbidities.  We will continue to follow.  Signed, Rozann Lesches, MD  05/21/2022 8:50 AM

## 2022-05-21 NOTE — TOC Progression Note (Signed)
Transition of Care [facility]) - Progression Note    Patient Details  Name: Pam Sanders MRN: [id number] Date of Birth: 1950-07-04  [facility] of [facility] CM/SW Contact  Ihor Gully, LCSW Phone Number: 05/21/2022, 5:13 PM  Clinical Narrative:    Discussed SNF recommendation with patient. Patient states that she wants to stay with her sister. TOC requested to contact sister to discuss. Patient's sister states that she does not think it would be a good idea for patient to reside with her because patient is argumentative and combative towards her. She discussed that she wants patient to go to [facility] or [facility]. Referrals sent to those two facilities.    Expected Discharge Plan: [place] Barriers to Discharge: Continued Medical Work up  Expected Discharge Plan and Services In-house Referral: Clinical Social Work     Living arrangements for the past 2 months: Apartment                                       Social Determinants of Health (SDOH) Interventions SDOH Screenings   Food Insecurity: No Food Insecurity (03/04/2021)  Housing: Low Risk  (03/04/2021)  Transportation Needs: No Transportation Needs (03/04/2021)  Alcohol Screen: Low Risk  (03/04/2021)  Depression (PHQ2-9): Low Risk  (04/03/2022)  Financial Resource Strain: Low Risk  (03/04/2021)  Physical Activity: Inactive (03/04/2021)  Social Connections: Socially Isolated (03/04/2021)  Stress: No Stress Concern Present (03/04/2021)  Tobacco Use: Low Risk  (05/21/2022)    Readmission Risk Interventions     No data to display

## 2022-05-21 NOTE — Hospital Course (Addendum)
72 y.o. female with past medical history relevant for history of seizures and intellectual disability in the setting of cerebral ventriculomegaly, GERD, hypothyroidism, schizophrenia and HTN presented to the ED with falls and weakness/lethargy and altered mentation.  Pt was found to have new atrial fibrillation with RVR.  Pt was treated for a severe hyponatremia with a sodium down to 114.  Cardiology consulted 2/14 for persistent afib RVR.  Pt noted to have rising LFTs that is being investigated.

## 2022-05-21 NOTE — Progress Notes (Signed)
Pt NPO since midnoc.  Reported pain in lower back and right hip area from fall at home, as well as nausea earlier in evening, managed with 1x compazine and prn morphine x 1 dose.   Repositioned for comfort.  Pt oriented to self/place.  Answers questions appropriately.  Pt has remained in AFIB on monitor with rate from 90s-130s, continues on scheduled IV metoprolol.  Pt completed 4 runs of potassium.  Takes meds whole, was able to swallow all at once.  Purewick in place, good urine output.  No BM this shift.  Repositioned for comfort.

## 2022-05-21 NOTE — Progress Notes (Signed)
PROGRESS NOTE   Pam Sanders  [id number] DOB: 1950-11-10 DOA: 05/19/2022 PCP: [name], FNP   Chief Complaint  Patient presents with   Fall   Level of care: Stepdown  Brief Admission History:  72 y.o. female with past medical history relevant for history of seizures and intellectual disability in the setting of cerebral ventriculomegaly, GERD, hypothyroidism, schizophrenia and HTN presented to the ED with falls and weakness/lethargy and altered mentation.  Pt was found to have new atrial fibrillation with RVR.  Pt was treated for a severe hyponatremia with a sodium down to 114.  Cardiology consulted 2/14 for persistent afib RVR.  Pt noted to have rising LFTs that is being investigated.    Assessment and Plan:  Symptomatic Severe Hyponatremia----factorial in the setting of recent stomach bug with vomiting or diarrhea, compounded by Lasix and Aldactone use as well as poor oral intake sodium of 114 (sodium was 141 on 10/01/2021) -Patient with metabolic encephalopathy, gait problems- -Sodium is up to 126 -completed 3% saline solution -remains on normal saline for now -BMP in AM   Elevated LFTs / Transaminitis  -LFTs remain elevated -right upper quadrant ultrasound -->findings of hepatic steatosis, equivocal findings for acute cholecystitis; cholelithiasis and wall thickening   New onset A-fib with RVR----IV metoprolol alone was ineffective at controlling HR -cardiology consulted, restarted diltiazem;  -EKG is consistent with A-fib with RVR -echo requested and pending -Elevated TSH noted, normal Free T4 reassuring   Recurrent Falls/Ambulatory Dysfunction and weakness--- due to severe hyponatremia -CT head without acute findings but shows 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 ventriculomegaly) -CT C-spine without acute findings -PT eval recommending SNF placement    Possible right greater trochanter  fracture--- Hip and pelvic x-rays with possible nondisplaced fracture of the greater trochanter on the right -Anticipate conservative management -Get PT eval -- SNF recommended -Consider repeat dedicated right hip x-rays prior to discharge   Hypothyroidism----TSH is 10.2 -Free T4 at 1.07 -continue levothyroxine supplementation   Hyperkalemia -added lokelma x 3 doses -recheck BMP in AM   CKD 3A with hyperkalemia---  -Creatinine trending down with hydration  -Potassium normalized after Lokelma - renally adjust medications, avoid nephrotoxic agents / dehydration  / hypotension   Epilepsy/history of seizures-- history of seizures and intellectual disability in the setting of cerebral ventriculomegaly- -no recent seizures -Not on antiepileptic agents PTA -Monitor closely especially with severe hyponatremia and risk for seizures   Possible UTI--- continue Rocephin pending culture data    DVT prophylaxis: SCDs Code Status: Full  Family Communication:  Disposition: Status is: Inpatient Remains inpatient appropriate because: intensity of illness   Consultants:  Cardiology  Procedures:   Antimicrobials:     Subjective: Pt without specific complaint at this time.   Objective: Vitals:   05/21/22 0300 05/21/22 0400 05/21/22 0452 05/21/22 0800  BP: 115/80 104/83    Pulse: (!) 144 (!) 147    Resp: 14 (!) 28    Temp:   (!) 97.3 F (36.3 C) 98.5 F (36.9 C)  TempSrc:   Oral Oral  SpO2: 98% 97%    Weight:   100.7 kg   Height:        Intake/Output Summary (Last 24 hours) at 05/21/2022 1611 Last data filed at 05/21/2022 1100 Gross per 24 hour  Intake 1370.48 ml  Output 1400 ml  Net -29.52 ml   Filed Weights   05/19/22 1038 05/19/22 1400 05/21/22 0452  Weight: 82.5 kg 97.4 kg 100.7  kg   Examination:  General exam: awake, alert, cooperative, NAD, Appears calm and comfortable  Respiratory system: Clear to auscultation. Respiratory effort normal. Cardiovascular system:  irregularly irregular and tachycardic, normal S1 & S2 heard. No JVD, murmurs, rubs, gallops or clicks. No pedal edema. Gastrointestinal system: Abdomen is nondistended, soft and nontender. No organomegaly or masses felt. Normal bowel sounds heard. Central nervous system: Alert and oriented. No focal neurological deficits. Extremities: Symmetric 5 x 5 power. Skin: No rashes, lesions or ulcers. Psychiatry: Mood & affect appropriate.   Data Reviewed: I have personally reviewed following labs and imaging studies  CBC: Recent Labs  Lab 05/19/22 1030 05/21/22 0434  WBC 10.0 10.0  NEUTROABS 7.7  --   HGB 11.4* 11.6*  HCT 32.8* 35.8*  MCV 95.9 101.4*  PLT 226 [id number]    Basic Metabolic Panel: Recent Labs  Lab 05/19/22 1030 05/19/22 1551 05/19/22 1758 05/19/22 2132 05/20/22 0202 05/20/22 0559 05/20/22 1517 05/21/22 0434  NA 114* 114*   < > 116* 120* 120* 128* 126*  K 5.3* 5.1  --   --   --  4.2 2.9* 5.3*  CL 82* 85*  --   --   --  93* 107 100  CO2 23 22  --   --   --  18* 15* 18*  GLUCOSE 101* 97  --   --   --  118* 76 78  BUN 21 21  --   --   --  22 19 21  $ CREATININE 1.27* 1.25*  --   --   --  1.02* 0.68 1.00  CALCIUM 9.2 9.0  --   --   --  8.2* 5.9* 8.2*  PHOS  --   --   --   --   --   --  2.7  --    < > = values in this interval not displayed.    CBG: Recent Labs  Lab 05/19/22 1032  GLUCAP 108*    Recent Results (from the past 240 hour(s))  MRSA Next Gen by PCR, Nasal     Status: None   Collection Time: 05/19/22  2:00 PM   Specimen: Nasal Mucosa; Nasal Swab  Result Value Ref Range Status   MRSA by PCR Next Gen NOT DETECTED NOT DETECTED Final    Comment: (NOTE) The GeneXpert MRSA Assay (FDA approved for NASAL specimens only), is one component of a comprehensive MRSA colonization surveillance program. It is not intended to diagnose MRSA infection nor to guide or monitor treatment for MRSA infections. Test performance is not FDA approved in patients less than 31  years old. Performed at [facility], [address] [place], [place] [address]   Resp panel by RT-PCR (RSV, Flu A&B, Covid) Anterior Nasal Swab     Status: None   Collection Time: 05/19/22  2:10 PM   Specimen: Anterior Nasal Swab  Result Value Ref Range Status   SARS Coronavirus 2 by RT PCR NEGATIVE NEGATIVE Final    Comment: (NOTE) SARS-CoV-2 target nucleic acids are NOT DETECTED.  The SARS-CoV-2 RNA is generally detectable in upper respiratory specimens during the acute phase of infection. The lowest concentration of SARS-CoV-2 viral copies this assay can detect is 138 copies/mL. A negative result does not preclude SARS-Cov-2 infection and should not be used as the sole basis for treatment or other patient management decisions. A negative result may occur with  improper specimen collection/handling, submission of specimen other than nasopharyngeal swab, presence of viral mutation(s) within  the areas targeted by this assay, and inadequate number of viral copies(<138 copies/mL). A negative result must be combined with clinical observations, patient history, and epidemiological information. The expected result is Negative.  Fact Sheet for Patients:  EntrepreneurPulse.com.au  Fact Sheet for Healthcare Providers:  IncredibleEmployment.be  This test is no t yet approved or cleared by the [place] FDA and  has been authorized for detection and/or diagnosis of SARS-CoV-2 by FDA under an Emergency Use Authorization (EUA). This EUA will remain  in effect (meaning this test can be used) for the duration of the COVID-19 declaration under Section 564(b)(1) of the Act, 21 U.S.C.section 360bbb-3(b)(1), unless the authorization is terminated  or revoked sooner.       Influenza A by PCR NEGATIVE NEGATIVE Final   Influenza B by PCR NEGATIVE NEGATIVE Final    Comment: (NOTE) The Xpert Xpress SARS-CoV-2/FLU/RSV plus assay is intended as an aid in  the diagnosis of influenza from Nasopharyngeal swab specimens and should not be used as a sole basis for treatment. Nasal washings and aspirates are unacceptable for Xpert Xpress SARS-CoV-2/FLU/RSV testing.  Fact Sheet for Patients: EntrepreneurPulse.com.au  Fact Sheet for Healthcare Providers: IncredibleEmployment.be  This test is not yet approved or cleared by the [place] FDA and has been authorized for detection and/or diagnosis of SARS-CoV-2 by FDA under an Emergency Use Authorization (EUA). This EUA will remain in effect (meaning this test can be used) for the duration of the COVID-19 declaration under Section 564(b)(1) of the Act, 21 U.S.C. section 360bbb-3(b)(1), unless the authorization is terminated or revoked.     Resp Syncytial Virus by PCR NEGATIVE NEGATIVE Final    Comment: (NOTE) Fact Sheet for Patients: EntrepreneurPulse.com.au  Fact Sheet for Healthcare Providers: IncredibleEmployment.be  This test is not yet approved or cleared by the [place] FDA and has been authorized for detection and/or diagnosis of SARS-CoV-2 by FDA under an Emergency Use Authorization (EUA). This EUA will remain in effect (meaning this test can be used) for the duration of the COVID-19 declaration under Section 564(b)(1) of the Act, 21 U.S.C. section 360bbb-3(b)(1), unless the authorization is terminated or revoked.  Performed at [facility], [address], [place], [place] [address]   Urine Culture     Status: None   Collection Time: 05/19/22  4:35 PM   Specimen: Urine, Clean Catch  Result Value Ref Range Status   Specimen Description   Final    URINE, CLEAN CATCH Performed at [facility], [address] [place], [place] [address]    Special Requests   Final    NONE Performed at [facility], [address] [place], [place] [address]    Culture   Final    NO GROWTH Performed at [facility], [place] [address], [place],  [address]    Report Status 05/21/2022 FINAL  Final     Radiology Studies: US Abdomen Limited RUQ (LIVER/GB)  Result Date: 05/21/2022 CLINICAL DATA:  Abnormal liver functions. EXAM: ULTRASOUND ABDOMEN LIMITED RIGHT UPPER QUADRANT COMPARISON:  CT examination dated 09/30/2018 FINDINGS: Gallbladder: There is a 2 cm shadowing gallstone. Bladder wall thickening measuring up to 6 mm. No sonographic Murphy sign noted by sonographer. Common bile duct: Diameter: 3.7 Liver: No focal lesion identified. Echogenic hepatic parenchyma concerning for hepatic steatosis. Portal vein is patent on color Doppler imaging with normal direction of blood flow towards the liver. Other: Right pleural effusion. IMPRESSION: 1. Cholelithiasis with gallbladder wall thickening. Negative sonographic Murphy sign. Findings are  equivocal for acute cholecystitis. 2. Hepatic steatosis. 3. Small right pleural effusion. Electronically Signed   By: Keane Police D.O.   On: 05/21/2022 09:46    Scheduled Meds:  calcium carbonate  1,250 mg Oral Q breakfast   Chlorhexidine Gluconate Cloth  6 each Topical Daily   cholecalciferol  1,000 Units Oral Daily   diltiazem  30 mg Oral Q6H   famotidine  20 mg Oral QHS   heparin  5,000 Units Subcutaneous Q8H   levothyroxine  200 mcg Oral QAC breakfast   multivitamin with minerals  1 tablet Oral Daily   pantoprazole  40 mg Oral Daily   sodium chloride flush  3 mL Intravenous Q12H   sodium chloride flush  3 mL Intravenous Q12H   Continuous Infusions:  sodium chloride     sodium chloride 50 mL/hr at 05/21/22 1141   cefTRIAXone (ROCEPHIN)  IV Stopped (05/20/22 2054)     LOS: 2 days   Critical Care Procedure Note Authorized and Performed by: Murvin Natal MD  Total Critical Care time:  55 mins Due to a high probability of clinically significant, life threatening deterioration, the patient required my highest level of preparedness to intervene emergently and  I personally spent this critical care time directly and personally managing the patient.  This critical care time included obtaining a history; examining the patient, pulse oximetry; ordering and review of studies; arranging urgent treatment with development of a management plan; evaluation of patient's response of treatment; frequent reassessment; and discussions with other providers.  This critical care time was performed to assess and manage the high probability of imminent and life threatening deterioration that could result in multi-organ failure.  It was exclusive of separately billable procedures and treating other patients and teaching time.    Irwin Brakeman, MD How to contact the [facility] Attending or Consulting provider [place] or covering provider during after hours [place], for this patient?  Check the care team in [facility] and look for a) attending/consulting [facility] provider listed and b) the [facility] team listed Log into [email or web address] and use [place] universal password to access. If you do not have the password, please contact the hospital operator. Locate the [facility] provider you are looking for under [facility] and page to a number that you can be directly reached. If you still have difficulty reaching the provider, please page the [facility] (Director on Call) for the Hospitalists listed on amion for assistance.  05/21/2022, 4:11 PM

## 2022-05-21 NOTE — NC FL2 (Signed)
[place] LEVEL OF CARE FORM     IDENTIFICATION  Patient Name: Pam Sanders Birthdate: 09-11-1950 Sex: female Admission Date (Current Location): 05/19/2022  [facility] and [place] Number:  [employer] and Address:  [place] [address], [place]      Provider Number: [id number]  Attending Physician Name and Address:  Murlean Iba, MD  Relative Name and Phone Number:       Current Level of Care: Hospital Recommended Level of Care: [place] Prior Approval Number:    Date Approved/Denied:   PASRR Number: [id number]  Discharge Plan: SNF    Current Diagnoses: Patient Active Problem List   Diagnosis Date Noted   Hyponatremia 05/19/2022   Prolonged Q-T interval on ECG 04/03/2022   History of long-term treatment with high-risk medication 04/03/2022   Agitation 11/28/2021   Osteopenia 09/29/2020   Lower extremity edema    Constipation 11/03/2018   Difficulty sleeping 10/29/2018   Allergic rhinitis 10/28/2018   Conductive hearing loss 10/28/2018   Degeneration of intervertebral disc 10/28/2018   Intellectual disability 10/28/2018   Tremor 06/04/2016   Primary hypertension 04/05/2016   Cerebral ventriculomegaly/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 ventriculomegaly) 08/14/2015   CKD (chronic kidney disease) stage 3, GFR 30-59 ml/min (HCC) 08/14/2015   Gait instability 08/14/2015   Macrocytic anemia 08/14/2015   Recurrent falls 08/14/2015   Dyslipidemia 03/29/2015   Gastroesophageal reflux disease without esophagitis 09/05/2014   Schizo-affective schizophrenia, chronic condition ([place]) 09/05/2014   Hypothyroidism 03/06/2014   Obesity (BMI 30-39.9) 03/06/2014   Peripheral vascular disease ([place]) 09/22/2013   Seizure disorder ([place]) 01/05/2013   Epileptic grand mal status ([place]) 08/03/1999    Orientation RESPIRATION BLADDER Height & Weight      Self, Place, Situation  Normal Incontinent Weight: 222 lb 0.1 oz (100.7 kg) Height:  4' 8"$  (142.2 cm)  BEHAVIORAL SYMPTOMS/MOOD NEUROLOGICAL BOWEL NUTRITION STATUS      Continent Diet (see dc summary)  AMBULATORY STATUS COMMUNICATION OF NEEDS Skin   Extensive Assist Verbally Normal                       Personal Care Assistance Level of Assistance  Bathing, Feeding, Dressing Bathing Assistance: Limited assistance Feeding assistance: Independent Dressing Assistance: Limited assistance     Functional Limitations Info  Sight, Hearing, Speech Sight Info: Impaired Hearing Info: Impaired Speech Info: Adequate    SPECIAL CARE FACTORS FREQUENCY  PT (By licensed PT), OT (By licensed OT)     PT Frequency: 5x week OT Frequency: 3x week            Contractures Contractures Info: Not present    Additional Factors Info  Code Status, Allergies Code Status Info: Full Allergies Info: NKA           Current Medications (05/21/2022):  This is the current hospital active medication list Current Facility-Administered Medications  Medication Dose Route Frequency Provider Last Rate Last Admin   0.9 %  sodium chloride infusion   Intravenous PRN Emokpae, Courage, MD       0.9 %  sodium chloride infusion   Intravenous Continuous Wynetta Emery, Clanford L, MD 50 mL/hr at 05/21/22 1141 New Bag at 05/21/22 1141   acetaminophen (TYLENOL) tablet 650 mg  650 mg Oral Q6H PRN Roxan Hockey, MD   650 mg at 05/20/22 1820   Or   acetaminophen (TYLENOL) suppository 650 mg  650 mg  Rectal Q6H PRN Roxan Hockey, MD       bisacodyl (DULCOLAX) suppository 10 mg  10 mg Rectal Daily PRN Emokpae, Courage, MD       calcium carbonate (OS-CAL - dosed in mg of elemental calcium) tablet 1,250 mg  1,250 mg Oral Q breakfast Emokpae, Courage, MD   1,250 mg at 05/21/22 0902   cefTRIAXone (ROCEPHIN) 1 g in sodium chloride 0.9 % 100 mL IVPB  1 g Intravenous Q24H Wynetta Emery, Clanford L, MD   Stopped at 05/20/22  2054   Chlorhexidine Gluconate Cloth 2 % PADS 6 each  6 each Topical Daily Roxan Hockey, MD   6 each at 05/21/22 1100   cholecalciferol (VITAMIN D3) 25 MCG (1000 UNIT) tablet 1,000 Units  1,000 Units Oral Daily Emokpae, Courage, MD   1,000 Units at 05/21/22 1001   diltiazem (CARDIZEM) tablet 30 mg  30 mg Oral Q6H Satira Sark, MD   30 mg at 05/21/22 0900   famotidine (PEPCID) tablet 20 mg  20 mg Oral QHS Emokpae, Courage, MD   20 mg at 05/20/22 2149   heparin injection 5,000 Units  5,000 Units Subcutaneous Q8H Emokpae, Courage, MD   5,000 Units at 05/21/22 1342   [START ON 05/22/2022] levothyroxine (SYNTHROID) tablet 150 mcg  150 mcg Oral QAC breakfast Johnson, Clanford L, MD       metoprolol tartrate (LOPRESSOR) injection 5 mg  5 mg Intravenous Q6H PRN Satira Sark, MD   5 mg at 05/21/22 1002   morphine (PF) 2 MG/ML injection 1 mg  1 mg Intravenous Q4H PRN Johnson, Clanford L, MD       multivitamin with minerals tablet 1 tablet  1 tablet Oral Daily Emokpae, Courage, MD   1 tablet at 05/21/22 1001   Oral care mouth rinse  15 mL Mouth Rinse PRN Emokpae, Courage, MD       pantoprazole (PROTONIX) EC tablet 40 mg  40 mg Oral Daily Emokpae, Courage, MD   40 mg at 05/21/22 1001   polyethylene glycol (MIRALAX / GLYCOLAX) packet 17 g  17 g Oral Daily PRN Emokpae, Courage, MD       sodium chloride flush (NS) 0.9 % injection 3 mL  3 mL Intravenous Q12H Emokpae, Courage, MD   3 mL at 05/21/22 1100   sodium chloride flush (NS) 0.9 % injection 3 mL  3 mL Intravenous Q12H Emokpae, Courage, MD   3 mL at 05/21/22 1059   sodium chloride flush (NS) 0.9 % injection 3 mL  3 mL Intravenous PRN Emokpae, Courage, MD       traZODone (DESYREL) tablet 50 mg  50 mg Oral QHS PRN Roxan Hockey, MD         Discharge Medications: Please see discharge summary for a list of discharge medications.  Relevant Imaging Results:  Relevant Lab Results:   Additional Information SSN: [id number],  Clydene Pugh, LCSW

## 2022-05-22 ENCOUNTER — Other Ambulatory Visit (HOSPITAL_COMMUNITY): Payer: Self-pay | Admitting: *Deleted

## 2022-05-22 ENCOUNTER — Inpatient Hospital Stay (HOSPITAL_COMMUNITY): Payer: 59

## 2022-05-22 DIAGNOSIS — E871 Hypo-osmolality and hyponatremia: Secondary | ICD-10-CM | POA: Diagnosis not present

## 2022-05-22 DIAGNOSIS — I420 Dilated cardiomyopathy: Secondary | ICD-10-CM

## 2022-05-22 DIAGNOSIS — G9389 Other specified disorders of brain: Secondary | ICD-10-CM

## 2022-05-22 DIAGNOSIS — I4891 Unspecified atrial fibrillation: Secondary | ICD-10-CM

## 2022-05-22 DIAGNOSIS — R7989 Other specified abnormal findings of blood chemistry: Secondary | ICD-10-CM

## 2022-05-22 DIAGNOSIS — I429 Cardiomyopathy, unspecified: Secondary | ICD-10-CM

## 2022-05-22 LAB — COMPREHENSIVE METABOLIC PANEL
ALT: 346 U/L — ABNORMAL HIGH (ref 0–44)
AST: 600 U/L — ABNORMAL HIGH (ref 15–41)
Albumin: 3.1 g/dL — ABNORMAL LOW (ref 3.5–5.0)
Alkaline Phosphatase: 187 U/L — ABNORMAL HIGH (ref 38–126)
Anion gap: 9 (ref 5–15)
BUN: 25 mg/dL — ABNORMAL HIGH (ref 8–23)
CO2: 19 mmol/L — ABNORMAL LOW (ref 22–32)
Calcium: 8.1 mg/dL — ABNORMAL LOW (ref 8.9–10.3)
Chloride: 99 mmol/L (ref 98–111)
Creatinine, Ser: 1.12 mg/dL — ABNORMAL HIGH (ref 0.44–1.00)
GFR, Estimated: 53 mL/min — ABNORMAL LOW (ref >60)
Glucose, Bld: 125 mg/dL — ABNORMAL HIGH (ref 70–99)
Potassium: 4.9 mmol/L (ref 3.5–5.1)
Sodium: 127 mmol/L — ABNORMAL LOW (ref 135–145)
Total Bilirubin: 0.8 mg/dL (ref 0.3–1.2)
Total Protein: 6.2 g/dL — ABNORMAL LOW (ref 6.5–8.1)

## 2022-05-22 LAB — ECHOCARDIOGRAM COMPLETE
Est EF: <20
Height: 56 in
S' Lateral: 4.5 cm
Weight: 3552.05 oz

## 2022-05-22 LAB — MAGNESIUM: Magnesium: 2.1 mg/dL (ref 1.7–2.4)

## 2022-05-22 LAB — CBC
HCT: 36.4 % (ref 36.0–46.0)
Hemoglobin: 12.1 g/dL (ref 12.0–15.0)
MCH: 33 pg (ref 26.0–34.0)
MCHC: 33.2 g/dL (ref 30.0–36.0)
MCV: 99.2 fL (ref 80.0–100.0)
Platelets: 230 10*3/uL (ref 150–400)
RBC: 3.67 MIL/uL — ABNORMAL LOW (ref 3.87–5.11)
RDW: 16.1 % — ABNORMAL HIGH (ref 11.5–15.5)
WBC: 9.9 10*3/uL (ref 4.0–10.5)
nRBC: 0.2 % (ref 0.0–0.2)

## 2022-05-22 LAB — LIPID PANEL
Cholesterol: 91 mg/dL (ref 0–200)
HDL: 45 mg/dL (ref >40)
LDL Cholesterol: 41 mg/dL (ref 0–99)
Total CHOL/HDL Ratio: 2 RATIO
Triglycerides: 25 mg/dL (ref <150)
VLDL: 5 mg/dL (ref 0–40)

## 2022-05-22 LAB — HEPATITIS PANEL, ACUTE
HCV Ab: NONREACTIVE
Hep A IgM: NONREACTIVE
Hep B C IgM: NONREACTIVE
Hepatitis B Surface Ag: NONREACTIVE

## 2022-05-22 LAB — IRON AND TIBC
Iron: 42 ug/dL (ref 28–170)
Saturation Ratios: 12 % (ref 10.4–31.8)
TIBC: 359 ug/dL (ref 250–450)
UIBC: 317 ug/dL

## 2022-05-22 LAB — FERRITIN: Ferritin: 905 ng/mL — ABNORMAL HIGH (ref 11–307)

## 2022-05-22 MED ORDER — POLYETHYLENE GLYCOL 3350 17 G PO PACK
17.0000 g | PACK | Freq: Two times a day (BID) | ORAL | Status: DC
  Administered 2022-05-22 – 2022-05-23 (×3): 17 g via ORAL
  Filled 2022-05-22 (×4): qty 1

## 2022-05-22 MED ORDER — METOPROLOL TARTRATE 25 MG PO TABS
12.5000 mg | ORAL_TABLET | Freq: Three times a day (TID) | ORAL | Status: DC
  Administered 2022-05-22 – 2022-05-24 (×6): 12.5 mg via ORAL
  Filled 2022-05-22 (×6): qty 1

## 2022-05-22 MED ORDER — LINACLOTIDE 145 MCG PO CAPS
145.0000 ug | ORAL_CAPSULE | Freq: Every day | ORAL | Status: DC
  Administered 2022-05-23 – 2022-05-26 (×4): 145 ug via ORAL
  Filled 2022-05-22 (×4): qty 1

## 2022-05-22 MED ORDER — KETOCONAZOLE 2 % EX CREA
TOPICAL_CREAM | Freq: Two times a day (BID) | CUTANEOUS | Status: DC
  Administered 2022-05-23: 1 via TOPICAL
  Filled 2022-05-22: qty 15

## 2022-05-22 NOTE — Plan of Care (Signed)

## 2022-05-22 NOTE — Progress Notes (Signed)
   Progress Note  Patient Name: Pam Sanders Date of Encounter: 05/22/2022  Primary Cardiologist: New to [place]  Follow-up to AM rounding note.  Echocardiogram shows biventricular dysfunction, LVEF less than 20% and moderate RV dysfunction with mildly increased RVSP, moderate mitral regurgitation, and moderate to severe tricuspid regurgitation.  Etiology of cardiomyopathy not entirely certain although no clear evidence of ACS at presentation.  Could be tachycardia induced or even secondary to myocarditis (had "stomach bug" a little over a week ago).  Suspect transaminitis is secondary to hepatic congestion.  Case discussed with Dr. Wynetta Emery.  Would not anticipate aggressive/invasive cardiac workup in light of comorbid illnesses and it may in fact be reasonable to involve palliative care given her poor prognosis.  Continue IV amiodarone.  Discontinue Cardizem and initiate oral Lopressor as tolerated.  Signed, Rozann Lesches, MD  05/22/2022, 2:39 PM

## 2022-05-22 NOTE — Progress Notes (Signed)
PROGRESS NOTE   Pam Sanders  [id number] DOB: [birth year] DOA: 05/19/2022 PCP: [name], FNP   Chief Complaint  Patient presents with   Fall   Level of care: Stepdown  Brief Admission History:  72 y.o. female with past medical history relevant for history of seizures and intellectual disability in the setting of cerebral ventriculomegaly, GERD, hypothyroidism, schizophrenia and HTN presented to the ED with falls and weakness/lethargy and altered mentation.  Pt was found to have new atrial fibrillation with RVR.  Pt was treated for a severe hyponatremia with a sodium down to 114.  Cardiology consulted 2/14 for persistent afib RVR.  Pt noted to have rising LFTs that is being investigated.    Assessment and Plan:  Symptomatic Severe Hyponatremia----factorial in the setting of recent stomach bug with vomiting or diarrhea, compounded by Lasix and Aldactone use as well as poor oral intake sodium of 114 (sodium was 141 on 10/01/2021) -Patient with metabolic encephalopathy, gait problems- -Sodium is up to 127 -completed 3% saline solution -stop normal saline now -BMP in AM   Newly discovered Cardiomyopathy -discussed with cardiology  -pt has biventricular heart failure with LVEF <20%  -discussed with cardiology, recommendation is to pursue palliative medicine -I discussed with patient's family and they are agreeable to palliative care at SNF, they are not able to care for patient at home; will ask [facility] for SNF placement with palliative care;  -consulted palliative care for goals of care discussions with family   Elevated LFTs / Transaminitis  -LFTs remain elevated -right upper quadrant ultrasound -->findings of hepatic steatosis, equivocal findings for acute cholecystitis; cholelithiasis and wall thickening   New onset A-fib with RVR----IV metoprolol alone was ineffective at controlling HR -cardiology consulted, restarted diltiazem;  -EKG is consistent with A-fib with  RVR -echo requested and pending -Elevated TSH noted, normal Free T4 reassuring   Recurrent Falls/Ambulatory Dysfunction and weakness--- due to severe hyponatremia -CT head without acute findings but shows 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 ventriculomegaly) -CT C-spine without acute findings -PT eval recommending SNF placement    Possible right greater trochanter fracture--- Hip and pelvic x-rays with possible nondisplaced fracture of the greater trochanter on the right -Anticipate conservative management -Get PT eval -- SNF recommended -Consider repeat dedicated right hip x-rays prior to discharge   Hypothyroidism----TSH is 10.2 -Free T4 at 1.07 -continue levothyroxine supplementation   Hyperkalemia -added lokelma x 3 doses -treated and resolved    CKD 3A with hyperkalemia---  -Creatinine trending down with hydration  -Potassium normalized after Lokelma - renally adjust medications, avoid nephrotoxic agents / dehydration  / hypotension   Epilepsy/history of seizures-- history of seizures and intellectual disability in the setting of cerebral ventriculomegaly- -no recent seizures -Not on antiepileptic agents PTA -Monitor closely especially with severe hyponatremia and risk for seizures   UTI--- treated with ceftriaxone  Candidal Intertrigo - ordered nizoral creme topical treatment    DVT prophylaxis: SCDs Code Status: Full  Family Communication: discussion with sister/brother in law at bedside  Disposition: Status is: Inpatient Remains inpatient appropriate because: intensity of illness   Consultants:  Cardiology  Procedures:   Antimicrobials:     Subjective: Pt without specific complaint at this time. No chest pain and no SOB.   Objective: Vitals:   05/22/22 1605 05/22/22 1607 05/22/22 1608 05/22/22 1611  BP:  115/75  115/75  Pulse:   (!) 131 (!) 122  Resp: 19     Temp:  TempSrc:      SpO2:   98%    Weight:      Height:        Intake/Output Summary (Last 24 hours) at 05/22/2022 1643 Last data filed at 05/22/2022 1613 Gross per 24 hour  Intake 1940.4 ml  Output 600 ml  Net 1340.4 ml   Filed Weights   05/19/22 1038 05/19/22 1400 05/21/22 0452  Weight: 82.5 kg 97.4 kg 100.7 kg   Examination:  General exam: awake, alert, cooperative, NAD, Appears calm and comfortable  Respiratory system: Clear to auscultation. Respiratory effort normal. Cardiovascular system: irregularly irregular and tachycardic, normal S1 & S2 heard. No JVD, murmurs, rubs, gallops or clicks. 1+ pedal edema. Gastrointestinal system: Abdomen is nondistended, soft and nontender. No organomegaly or masses felt. Normal bowel sounds heard. Central nervous system: Alert and oriented. No focal neurological deficits. Extremities: Symmetric 5 x 5 power. Skin: No rashes, lesions or ulcers. Psychiatry: Mood & affect appropriate.   Data Reviewed: I have personally reviewed following labs and imaging studies  CBC: Recent Labs  Lab 05/19/22 1030 05/21/22 0434 05/22/22 0452  WBC 10.0 10.0 9.9  NEUTROABS 7.7  --   --   HGB 11.4* 11.6* 12.1  HCT 32.8* 35.8* 36.4  MCV 95.9 101.4* 99.2  PLT 226 190 [id number]    Basic Metabolic Panel: Recent Labs  Lab 05/20/22 0559 05/20/22 1517 05/21/22 0434 05/21/22 2221 05/22/22 0452  NA 120* 128* 126* 127* 127*  K 4.2 2.9* 5.3* 4.6 4.9  CL 93* 107 100 98 99  CO2 18* 15* 18* 19* 19*  GLUCOSE 118* 76 78 109* 125*  BUN 22 19 21 23 $ 25*  CREATININE 1.02* 0.68 1.00 1.12* 1.12*  CALCIUM 8.2* 5.9* 8.2* 8.3* 8.1*  MG  --   --   --  2.1 2.1  PHOS  --  2.7  --   --   --     CBG: Recent Labs  Lab 05/19/22 1032  GLUCAP 108*    Recent Results (from the past 240 hour(s))  MRSA Next Gen by PCR, Nasal     Status: None   Collection Time: 05/19/22  2:00 PM   Specimen: Nasal Mucosa; Nasal Swab  Result Value Ref Range Status   MRSA by PCR Next Gen NOT DETECTED NOT DETECTED  Final    Comment: (NOTE) The GeneXpert MRSA Assay (FDA approved for NASAL specimens only), is one component of a comprehensive MRSA colonization surveillance program. It is not intended to diagnose MRSA infection nor to guide or monitor treatment for MRSA infections. Test performance is not FDA approved in patients less than 47 years old. Performed at [facility], [address], [place], [place] [address]   Resp panel by RT-PCR (RSV, Flu A&B, Covid) Anterior Nasal Swab     Status: None   Collection Time: 05/19/22  2:10 PM   Specimen: Anterior Nasal Swab  Result Value Ref Range Status   SARS Coronavirus 2 by RT PCR NEGATIVE NEGATIVE Final    Comment: (NOTE) SARS-CoV-2 target nucleic acids are NOT DETECTED.  The SARS-CoV-2 RNA is generally detectable in upper respiratory specimens during the acute phase of infection. The lowest concentration of SARS-CoV-2 viral copies this assay can detect is 138 copies/mL. A negative result does not preclude SARS-Cov-2 infection and should not be used as the sole basis for treatment or other patient management decisions. A negative result may occur with  improper specimen collection/handling, submission of specimen other than nasopharyngeal swab, presence of  viral mutation(s) within the areas targeted by this assay, and inadequate number of viral copies(<138 copies/mL). A negative result must be combined with clinical observations, patient history, and epidemiological information. The expected result is Negative.  Fact Sheet for Patients:  EntrepreneurPulse.com.au  Fact Sheet for Healthcare Providers:  IncredibleEmployment.be  This test is no t yet approved or cleared by the [place] FDA and  has been authorized for detection and/or diagnosis of SARS-CoV-2 by FDA under an Emergency Use Authorization (EUA). This EUA will remain  in effect (meaning this test can be used) for the duration of the COVID-19  declaration under Section 564(b)(1) of the Act, 21 U.S.C.section 360bbb-3(b)(1), unless the authorization is terminated  or revoked sooner.       Influenza A by PCR NEGATIVE NEGATIVE Final   Influenza B by PCR NEGATIVE NEGATIVE Final    Comment: (NOTE) The Xpert Xpress SARS-CoV-2/FLU/RSV plus assay is intended as an aid in the diagnosis of influenza from Nasopharyngeal swab specimens and should not be used as a sole basis for treatment. Nasal washings and aspirates are unacceptable for Xpert Xpress SARS-CoV-2/FLU/RSV testing.  Fact Sheet for Patients: EntrepreneurPulse.com.au  Fact Sheet for Healthcare Providers: IncredibleEmployment.be  This test is not yet approved or cleared by the [place] FDA and has been authorized for detection and/or diagnosis of SARS-CoV-2 by FDA under an Emergency Use Authorization (EUA). This EUA will remain in effect (meaning this test can be used) for the duration of the COVID-19 declaration under Section 564(b)(1) of the Act, 21 U.S.C. section 360bbb-3(b)(1), unless the authorization is terminated or revoked.     Resp Syncytial Virus by PCR NEGATIVE NEGATIVE Final    Comment: (NOTE) Fact Sheet for Patients: EntrepreneurPulse.com.au  Fact Sheet for Healthcare Providers: IncredibleEmployment.be  This test is not yet approved or cleared by the [place] FDA and has been authorized for detection and/or diagnosis of SARS-CoV-2 by FDA under an Emergency Use Authorization (EUA). This EUA will remain in effect (meaning this test can be used) for the duration of the COVID-19 declaration under Section 564(b)(1) of the Act, 21 U.S.C. section 360bbb-3(b)(1), unless the authorization is terminated or revoked.  Performed at [facility], [address], [place], [place] [address]   Urine Culture     Status: None   Collection Time: 05/19/22  4:35 PM   Specimen: Urine, Clean  Catch  Result Value Ref Range Status   Specimen Description   Final    URINE, CLEAN CATCH Performed at [facility], [address], [place], [place] [address]    Special Requests   Final    NONE Performed at [facility], [address] [place], [place] [address]    Culture   Final    NO GROWTH Performed at [facility], [place] [address], [place], [place] [address]    Report Status 05/21/2022 FINAL  Final     Radiology Studies: ECHOCARDIOGRAM COMPLETE  Result Date: 05/22/2022    ECHOCARDIOGRAM REPORT   Patient Name:   JIAQI RAHN Date of Exam: 05/22/2022 Medical Rec #:  [id number]        Height:       56.0 in Accession #:    [id number]       Weight:       222.0 lb Date of Birth:  [birth year]        BSA:          1.856 m Patient Age:    28 years  BP:           112/78 mmHg Patient Gender: F                HR:           119 bpm. Exam Location:  [facility] Procedure: 2D Echo, Cardiac Doppler and Color Doppler Indications:    atrial fibrillation  History:        Patient has prior history of Echocardiogram examinations, most                 recent 04/06/2016. Chronic kidney disease; Risk                 Factors:Hypertension.  Sonographer:    Johny Chess RDCS Referring Phys: [place]  1. Left ventricular ejection fraction, by estimation, is <20%. The left ventricle has severely decreased function. The left ventricle demonstrates regional wall motion abnormalities (see scoring diagram/findings for description). Left ventricular diastolic parameters are indeterminate.  2. Right ventricular systolic function is moderately reduced. The right ventricular size is mildly enlarged. There is mildly elevated pulmonary artery systolic pressure. The estimated right ventricular systolic pressure is [id number] mmHg.  3. Left atrial size was moderately dilated.  4. The mitral valve is degenerative. Moderate mitral valve regurgitation.  5. Tricuspid valve regurgitation is  moderate to severe.  6. The aortic valve is tricuspid. Aortic valve regurgitation is not visualized.  7. The inferior vena cava is dilated in size with <50% respiratory variability, suggesting right atrial pressure of 15 mmHg. Comparison(s): Prior images unable to be directly viewed. FINDINGS  Left Ventricle: Left ventricular ejection fraction, by estimation, is <20%. The left ventricle has severely decreased function. The left ventricle demonstrates regional wall motion abnormalities. The left ventricular internal cavity size was normal in size. There is no left ventricular hypertrophy. Left ventricular diastolic function could not be evaluated due to atrial fibrillation. Left ventricular diastolic parameters are indeterminate.  LV Wall Scoring: The entire anterior wall, entire septum, entire apex, and entire inferior wall are akinetic. The antero-lateral wall and mid inferolateral segment are hypokinetic. The basal inferolateral segment is normal. Right Ventricle: The right ventricular size is mildly enlarged. No increase in right ventricular wall thickness. Right ventricular systolic function is moderately reduced. There is mildly elevated pulmonary artery systolic pressure. The tricuspid regurgitant velocity is 2.49 m/s, and with an assumed right atrial pressure of 15 mmHg, the estimated right ventricular systolic pressure is [id number] mmHg. Left Atrium: Left atrial size was moderately dilated. Right Atrium: Right atrial size was normal in size. Pericardium: Trivial pericardial effusion is present. The pericardial effusion is posterior to the left ventricle. Mitral Valve: The mitral valve is degenerative in appearance. Moderate mitral valve regurgitation. Tricuspid Valve: The tricuspid valve is grossly normal. Tricuspid valve regurgitation is moderate to severe. Aortic Valve: The aortic valve is tricuspid. There is mild aortic valve annular calcification. Aortic valve regurgitation is not visualized. Pulmonic Valve:  The pulmonic valve was grossly normal. Pulmonic valve regurgitation is trivial. Aorta: The aortic root is normal in size and structure. Venous: The inferior vena cava is dilated in size with less than 50% respiratory variability, suggesting right atrial pressure of 15 mmHg. IAS/Shunts: No atrial level shunt detected by color flow Doppler.  LEFT VENTRICLE PLAX 2D LVIDd:         5.00 cm LVIDs:         4.50 cm LV PW:         0.90 cm  LV IVS:        0.60 cm LVOT diam:     1.60 cm LVOT Area:     2.01 cm  RIGHT VENTRICLE RV Basal diam:  2.40 cm RV S prime:     8.05 cm/s TAPSE (M-mode): 1.1 cm LEFT ATRIUM           Index        RIGHT ATRIUM           Index LA diam:      3.90 cm 2.10 cm/m   RA Area:     17.10 cm LA Vol (A4C): 86.9 ml 46.83 ml/m  RA Volume:   44.50 ml  23.98 ml/m   AORTA Ao Root diam: 2.60 cm Ao Asc diam:  2.80 cm TRICUSPID VALVE TR Peak grad:   24.8 mmHg TR Vmax:        249.00 cm/s  SHUNTS Systemic Diam: 1.60 cm Rozann Lesches MD Electronically signed by Rozann Lesches MD Signature Date/Time: 05/22/2022/1:17:48 PM    Final    US Abdomen Limited RUQ (LIVER/GB)  Result Date: 05/21/2022 CLINICAL DATA:  Abnormal liver functions. EXAM: ULTRASOUND ABDOMEN LIMITED RIGHT UPPER QUADRANT COMPARISON:  CT examination dated 09/30/2018 FINDINGS: Gallbladder: There is a 2 cm shadowing gallstone. Bladder wall thickening measuring up to 6 mm. No sonographic Murphy sign noted by sonographer. Common bile duct: Diameter: 3.7 Liver: No focal lesion identified. Echogenic hepatic parenchyma concerning for hepatic steatosis. Portal vein is patent on color Doppler imaging with normal direction of blood flow towards the liver. Other: Right pleural effusion. IMPRESSION: 1. Cholelithiasis with gallbladder wall thickening. Negative sonographic Murphy sign. Findings are equivocal for acute cholecystitis. 2. Hepatic steatosis. 3. Small right pleural effusion. Electronically Signed   By: Keane Police D.O.   On: 05/21/2022 09:46     Scheduled Meds:  calcium carbonate  1,250 mg Oral Q breakfast   Chlorhexidine Gluconate Cloth  6 each Topical Daily   cholecalciferol  1,000 Units Oral Daily   famotidine  20 mg Oral QHS   heparin  5,000 Units Subcutaneous Q8H   ketoconazole   Topical BID   levothyroxine  150 mcg Oral QAC breakfast   metoprolol tartrate  12.5 mg Oral TID   multivitamin with minerals  1 tablet Oral Daily   pantoprazole  40 mg Oral Daily   polyethylene glycol  17 g Oral BID   sodium chloride flush  3 mL Intravenous Q12H   sodium chloride flush  3 mL Intravenous Q12H   Continuous Infusions:  sodium chloride     sodium chloride 10 mL/hr at 05/22/22 1613   amiodarone 30 mg/hr (05/22/22 1613)     LOS: 3 days   Critical Care Procedure Note Authorized and Performed by: Murvin Natal MD  Total Critical Care time:  61 mins Due to a high probability of clinically significant, life threatening deterioration, the patient required my highest level of preparedness to intervene emergently and I personally spent this critical care time directly and personally managing the patient.  This critical care time included obtaining a history; examining the patient, pulse oximetry; ordering and review of studies; arranging urgent treatment with development of a management plan; evaluation of patient's response of treatment; frequent reassessment; and discussions with other providers.  This critical care time was performed to assess and manage the high probability of imminent and life threatening deterioration that could result in multi-organ failure.  It was exclusive of separately billable procedures and treating other patients and teaching  time.    Irwin Brakeman, MD How to contact the [facility] Attending or Consulting provider [place] or covering provider during after hours [place], for this patient?  Check the care team in [facility] and look for a) attending/consulting [facility] provider listed and b) the [facility] team listed Log into  [email or web address] and use [place]'s universal password to access. If you do not have the password, please contact the hospital operator. Locate the [facility] provider you are looking for under [facility] and page to a number that you can be directly reached. If you still have difficulty reaching the provider, please page the [facility] (Director on Call) for the Hospitalists listed on amion for assistance.  05/22/2022, 4:43 PM

## 2022-05-22 NOTE — Progress Notes (Signed)
Progress Note  Patient Name: Pam Sanders Date of Encounter: 05/22/2022  Primary Cardiologist: New to [facility]  Subjective   Eating breakfast this morning.  Denies chest or abdominal discomfort.  Inpatient Medications    Scheduled Meds:  calcium carbonate  1,250 mg Oral Q breakfast   Chlorhexidine Gluconate Cloth  6 each Topical Daily   cholecalciferol  1,000 Units Oral Daily   diltiazem  30 mg Oral Q6H   famotidine  20 mg Oral QHS   heparin  5,000 Units Subcutaneous Q8H   levothyroxine  150 mcg Oral QAC breakfast   multivitamin with minerals  1 tablet Oral Daily   pantoprazole  40 mg Oral Daily   sodium chloride flush  3 mL Intravenous Q12H   sodium chloride flush  3 mL Intravenous Q12H   Continuous Infusions:  sodium chloride     sodium chloride 35 mL/hr at 05/22/22 0605   amiodarone 30 mg/hr (05/22/22 0605)   PRN Meds: sodium chloride, acetaminophen **OR** acetaminophen, bisacodyl, metoprolol tartrate, morphine injection, mouth rinse, polyethylene glycol, sodium chloride flush, traZODone   Vital Signs    Vitals:   05/22/22 0700 05/22/22 0701 05/22/22 0730 05/22/22 0732  BP: 98/66  92/62   Pulse: 69 80 (!) 56   Resp: 16 13 16   $ Temp:    97.6 F (36.4 C)  TempSrc:    Axillary  SpO2: 97% 97% 99%   Weight:      Height:        Intake/Output Summary (Last 24 hours) at 05/22/2022 0827 Last data filed at 05/22/2022 [id number] Gross per 24 hour  Intake 1436.14 ml  Output 1325 ml  Net 111.14 ml   Filed Weights   05/19/22 1038 05/19/22 1400 05/21/22 0452  Weight: 82.5 kg 97.4 kg 100.7 kg    Telemetry    Atrial fibrillation.  Personally reviewed.  ECG    No ECG reviewed.  Physical Exam   GEN: No acute distress.   Neck: No JVD. Cardiac: Irregularly irregular without gallop.  Respiratory: Nonlabored. Clear to auscultation bilaterally. GI: Soft, nontender, bowel sounds present. MS: No edema; No deformity. Neuro:  Nonfocal. Psych: Alert  and oriented x 3. Normal affect.  Labs    Chemistry Recent Labs  Lab 05/20/22 0559 05/20/22 1517 05/21/22 0434 05/21/22 2221 05/22/22 0452  NA 120* 128* 126* 127* 127*  K 4.2 2.9* 5.3* 4.6 4.9  CL 93* 107 100 98 99  CO2 18* 15* 18* 19* 19*  GLUCOSE 118* 76 78 109* 125*  BUN 22 19 21 23 $ 25*  CREATININE 1.02* 0.68 1.00 1.12* 1.12*  CALCIUM 8.2* 5.9* 8.2* 8.3* 8.1*  PROT 5.8*  --  6.0*  --  6.2*  ALBUMIN 3.0* 2.1* 3.1*  --  3.1*  AST 150*  --  261*  --  600*  ALT 108*  --  168*  --  346*  ALKPHOS 96  --  109  --  187*  BILITOT 0.7  --  0.8  --  0.8  GFRNONAA 59* >60 >60 53* 53*  ANIONGAP 9 6 8 10 9     $ Hematology Recent Labs  Lab 05/19/22 1030 05/21/22 0434 05/22/22 0452  WBC 10.0 10.0 9.9  RBC 3.42* 3.53* 3.67*  HGB 11.4* 11.6* 12.1  HCT 32.8* 35.8* 36.4  MCV 95.9 101.4* 99.2  MCH 33.3 32.9 33.0  MCHC 34.8 32.4 33.2  RDW 14.3 15.6* 16.1*  PLT 226 190 230    Cardiac Enzymes Recent  Labs  Lab 05/21/22 2221  TROPONINIHS 14    Radiology    US Abdomen Limited RUQ (LIVER/GB)  Result Date: 05/21/2022 CLINICAL DATA:  Abnormal liver functions. EXAM: ULTRASOUND ABDOMEN LIMITED RIGHT UPPER QUADRANT COMPARISON:  CT examination dated 09/30/2018 FINDINGS: Gallbladder: There is a 2 cm shadowing gallstone. Bladder wall thickening measuring up to 6 mm. No sonographic Murphy sign noted by sonographer. Common bile duct: Diameter: 3.7 Liver: No focal lesion identified. Echogenic hepatic parenchyma concerning for hepatic steatosis. Portal vein is patent on color Doppler imaging with normal direction of blood flow towards the liver. Other: Right pleural effusion. IMPRESSION: 1. Cholelithiasis with gallbladder wall thickening. Negative sonographic Murphy sign. Findings are equivocal for acute cholecystitis. 2. Hepatic steatosis. 3. Small right pleural effusion. Electronically Signed   By: Keane Police D.O.   On: 05/21/2022 09:46    Assessment & Plan    1.  Newly diagnosed atrial  fibrillation, persistent with RVR.  CHA2DS2-VASc score is 4.  Echocardiogram pending.  She was initially started on Cardizem IV with transition to oral regimen and as needed divided dose IV Lopressor.  Held off on amiodarone initially given rising LFTs, although this was started overnight by primary team given persistent RVR and inability to uptitrate standard AV nodal blockers.  Heart rate 90s to low 110s this morning.  2.  Essential hypertension by history, on Cozaar as an outpatient (presently held).  Recent systolics in the [id number] to low 100s.  3.  Severe hyponatremia at presentation, improved with management per primary team.  4.  Transaminitis, LFTs rising.  Does have cholelithiasis by right upper quadrant ultrasound, also hepatic steatosis but no definitive evidence of cholecystitis.  She reports no abdominal pain.  5.  Suspected right greater trochanteric fracture after fall at home.  Conservative management at this time per primary team.  6.  Hypothyroidism, TSH 10.2.  She is on Synthroid.  7.  History of seizures.  8.  Baseline electrical disability, also history of schizophrenia.  Chart reviewed since consultation yesterday.  Would go ahead with echocardiogram today to exclude cardiomyopathy.  Continue IV amiodarone for now, but would look at this as a temporary measure particularly with her transaminitis.  As blood pressure stabilizes would uptitrate AV nodal blockers.  She is not an optimal candidate for anticoagulation.  Signed, Rozann Lesches, MD  05/22/2022, 8:27 AM

## 2022-05-22 NOTE — Consult Note (Signed)
Gastroenterology Consult   Referring Provider: No ref. provider found Primary Care Physician:  [name], FNP Primary Gastroenterologist:  formerly Dr. Oneida Alar  Patient ID: Pam Sanders; [id number]; 11-15-1950   Admit date: 05/19/2022  LOS: 3 days   Date of Consultation: 05/22/2022  Reason for Consultation:  elevated LFTs    History of Present Illness   Pam Sanders is a 72 y.o. female with past medical history of seizures, hearing impairment, intellectual disability in the setting of cerebral ventriculomegaly, GERD, hypothyroidism, schizophrenia, hypertension who presented to the ED with falls, weakness, altered mentation.  Patient found to have new A-fib with RVR.  Also noted to have severe hyponatremia with sodium down to 114.  Patient with recent "stomach bug" with vomiting and diarrhea.  Patient also noted to have possible nondisplaced fracture of the greater trochanter on the right.  GI consulted for rising LFTs.  On admission on May 19, 2022: Total bilirubin 0.7, alkaline phosphatase 85, AST 129, ALT 102.  LFTs have been normal December 2023.  Sodium 114, potassium 5.3, creatinine 1.27.  White blood cell count 10,000, hemoglobin 11.4, platelets 226,000.  Ethanol level negative, urine drug screen negative, respiratory panel negative.  Urine culture negative.  LFTs have continued to rise.  Yesterday total bilirubin 0.8, alkaline phosphatase 109, AST 261, ALT 168.  Sodium up to 126.  Today total bilirubin 0.8, alkaline phosphatase 187, AST 600, ALT 346. CBC unremarkable.  Creatinine 1.12, sodium 127.  Right upper quadrant ultrasound yesterday 2 cm shadowing gallstone.  Gallbladder wall thickening measuring up to 6 mm.  Negative sonographic Murphy sign.  Hepatic steatosis.  Common bile duct 3.81m.  Nocturnal blood pressures have been soft with systolic as low as 75 overnight. Patient eating well. She denied abdominal pain when questioned by nurse but had told me  she had pain earlier but now resolved. She is a difficult history. Question reliability. No BM recorded this admission. Patient denies N/V. She ate 100% of her breakfast except the banana.     Prior to Admission medications   Medication Sig Start Date End Date Taking? Authorizing Provider  atorvastatin (LIPITOR) 10 MG tablet TAKE ONE TABLET ONCE DAILY 12/18/21  Yes JHendricks LimesF, FNP  calcium carbonate (OSCAL) 1500 (600 Ca) MG TABS tablet Take 1,200 mg by mouth daily.   Yes [provider]  cetirizine (ZYRTEC) 10 MG tablet TAKE 1 TABLET DAILY FOR ALLERGY SYMPTOMS 12/27/21  Yes JHendricks LimesF, FNP  Cholecalciferol (VITAMIN D-3) 1000 UNITS CAPS Take 1,000 Units by mouth daily.    Yes [provider]  famotidine (PEPCID) 20 MG tablet Take 1 tablet (20 mg total) by mouth at bedtime. (NEEDS TO BE SEEN BEFORE NEXT REFILL) 05/06/22  Yes MGwenlyn Perking FNP  fluticasone ([facility] 50 MCG/ACT nasal spray USE 2 SPRAYS INTO EACH NOSTRIL ONCE DAILY AT BEDTIME 08/19/21  Yes JLoman Brooklyn FNP  furosemide (LASIX) 20 MG tablet Take 1 tablet (20 mg total) by mouth daily. (NEEDS TO BE SEEN BEFORE NEXT REFILL) 05/06/22  Yes MGwenlyn Perking FNP  lamoTRIgine (LAMICTAL) 100 MG tablet One qam, 2qhs Patient taking differently: 1-2 tablets 2 (two) times daily. Take 1 tablet in the morning and 2 tablets at bedtime 11/28/21  Yes YMarcial Pacas MD  levothyroxine (SYNTHROID) 150 MCG tablet Take 1 tablet (150 mcg total) by mouth daily before breakfast. 10/01/21  Yes JLoman Brooklyn FNP  loratadine (CLARITIN) 10 MG tablet Take 1 tablet (10 mg total)  by mouth daily. 09/06/19  Yes Loman Brooklyn, FNP  losartan (COZAAR) 25 MG tablet Take 1 tablet (25 mg total) by mouth daily. (NEEDS TO BE SEEN BEFORE NEXT REFILL) 05/06/22  Yes [name], FNP  Melatonin 5 MG CAPS Take 5 mg by mouth at bedtime.   Yes [provider]  Multiple Vitamin (MULTIVITAMIN WITH MINERALS) TABS tablet Take 1 tablet by  mouth daily.   Yes [provider]  Olopatadine HCl 0.2 % SOLN Apply 1 drop to eye daily. 12/23/18  Yes Loman Brooklyn, FNP  Omega-3 Fatty Acids (FISH OIL) 1000 MG CAPS Take 1 capsule by mouth daily.    Yes [provider]  omeprazole (PRILOSEC) 20 MG capsule TAKE 1 CAPSULE 2 TIMES A DAY BEFORE A MEAL 12/18/21  Yes Hendricks Limes F, FNP  potassium chloride SA (KLOR-CON M) 20 MEQ tablet Take 1 tablet (20 mEq total) by mouth 2 (two) times daily. (NEEDS TO BE SEEN BEFORE NEXT REFILL) 05/06/22  Yes [name], FNP  sertraline (ZOLOFT) 50 MG tablet Take 1 tablet (50 mg total) by mouth daily. (NEEDS TO BE SEEN BEFORE NEXT REFILL) 05/06/22  Yes [name], FNP  spironolactone (ALDACTONE) 25 MG tablet Take 1 tablet (25 mg total) by mouth daily. (NEEDS TO BE SEEN BEFORE NEXT REFILL) 05/06/22  Yes [name], FNP  thioridazine (MELLARIL) 50 MG tablet Take 1 tablet (50 mg total) by mouth at bedtime. (NEEDS TO BE SEEN BEFORE NEXT REFILL) 05/06/22  Yes [name], FNP  Clotrimazole 1 % OINT Apply 1 application topically 2 (two) times daily. Patient not taking: Reported on 05/19/2022 01/12/19   Loman Brooklyn, FNP  LINZESS 72 MCG capsule TAKE 1 CAPSULE DAILY BEFORE BREAKFAST Patient not taking: Reported on 05/19/2022 02/27/20   Mahala Menghini, PA-C    Current Facility-Administered Medications  Medication Dose Route Frequency Provider Last Rate Last Admin   0.9 %  sodium chloride infusion   Intravenous PRN Emokpae, Courage, MD       0.9 %  sodium chloride infusion   Intravenous Continuous Wynetta Emery, Clanford L, MD 35 mL/hr at 05/22/22 [id number] Infusion Verify at 05/22/22 [id number]   acetaminophen (TYLENOL) tablet 650 mg  650 mg Oral Q6H PRN Roxan Hockey, MD   650 mg at 05/21/22 2256   Or   acetaminophen (TYLENOL) suppository 650 mg  650 mg Rectal Q6H PRN Roxan Hockey, MD       amiodarone (NEXTERONE PREMIX) 360-4.14 MG/200ML-% (1.8 mg/mL) IV infusion  30 mg/hr Intravenous  Continuous Zierle-Ghosh, Asia B, DO 16.67 mL/hr at 05/22/22 0605 30 mg/hr at 05/22/22 [id number]   bisacodyl (DULCOLAX) suppository 10 mg  10 mg Rectal Daily PRN Roxan Hockey, MD       calcium carbonate (OS-CAL - dosed in mg of elemental calcium) tablet 1,250 mg  1,250 mg Oral Q breakfast Emokpae, Courage, MD   1,250 mg at 05/22/22 0731   Chlorhexidine Gluconate Cloth 2 % PADS 6 each  6 each Topical Daily Emokpae, Courage, MD   6 each at 05/22/22 0600   cholecalciferol (VITAMIN D3) 25 MCG (1000 UNIT) tablet 1,000 Units  1,000 Units Oral Daily Emokpae, Courage, MD   1,000 Units at 05/21/22 1001   diltiazem (CARDIZEM) tablet 30 mg  30 mg Oral Q6H Satira Sark, MD   30 mg at 05/21/22 1840   famotidine (PEPCID) tablet 20 mg  20 mg Oral QHS Emokpae, Courage, MD   20 mg at 05/21/22  2117   heparin injection 5,000 Units  5,000 Units Subcutaneous Q8H Emokpae, Courage, MD   5,000 Units at 05/22/22 0604   levothyroxine (SYNTHROID) tablet 150 mcg  150 mcg Oral QAC breakfast Wynetta Emery, Clanford L, MD   150 mcg at 05/22/22 0604   metoprolol tartrate (LOPRESSOR) injection 5 mg  5 mg Intravenous Q6H PRN Satira Sark, MD   5 mg at 05/21/22 2117   morphine (PF) 2 MG/ML injection 1 mg  1 mg Intravenous Q4H PRN Wynetta Emery, Clanford L, MD       multivitamin with minerals tablet 1 tablet  1 tablet Oral Daily Emokpae, Courage, MD   1 tablet at 05/21/22 1001   Oral care mouth rinse  15 mL Mouth Rinse PRN Emokpae, Courage, MD       pantoprazole (PROTONIX) EC tablet 40 mg  40 mg Oral Daily Emokpae, Courage, MD   40 mg at 05/21/22 1001   polyethylene glycol (MIRALAX / GLYCOLAX) packet 17 g  17 g Oral Daily PRN Emokpae, Courage, MD       sodium chloride flush (NS) 0.9 % injection 3 mL  3 mL Intravenous Q12H Emokpae, Courage, MD   3 mL at 05/21/22 2118   sodium chloride flush (NS) 0.9 % injection 3 mL  3 mL Intravenous Q12H Emokpae, Courage, MD   3 mL at 05/21/22 2117   sodium chloride flush (NS) 0.9 % injection 3 mL  3 mL  Intravenous PRN Roxan Hockey, MD       traZODone (DESYREL) tablet 50 mg  50 mg Oral QHS PRN Denton Brick, Courage, MD   50 mg at 05/21/22 2117    Allergies as of 05/19/2022   (No Known Allergies)    Past Medical History:  Diagnosis Date   Gait disturbance    GERD (gastroesophageal reflux disease)    HA (headache)    Hernia of abdominal wall    History of hernia surgery    Hypertension    Hypothyroidism    Intellectual disability    Lower extremity edema    Osteopenia 09/29/2020   Schizophrenia ([place])    Seizures ([place])    Tremor     Past Surgical History:  Procedure Laterality Date   CATARACT EXTRACTION, BILATERAL     STOMACH SURGERY     VENTRAL HERNIA REPAIR  11/09/2020    Family History  Problem Relation Age of Onset   Stroke Mother    Stroke Father    Stroke Other    Diabetes Other    Heart Problems Other    Thyroid cancer Sister    Hypertension Sister    Emphysema Brother    Other Brother        died at birth   Aneurysm Brother        stomach   Throat cancer Brother    Emphysema Brother    Heart disease Brother    Stroke Brother    Other Brother        MVA   Other Sister        died at birth   Aneurysm Sister        head    Social History   Socioeconomic History   Marital status: Single    Spouse name: Not on file   Number of children: 0   Years of education: HS   Highest education level: 12th grade  Occupational History   Occupation: Disabled  Tobacco Use   Smoking status: Never   Smokeless tobacco: Never  Vaping Use   Vaping Use: Never used  Substance and Sexual Activity   Alcohol use: No   Drug use: No   Sexual activity: Not Currently    Birth control/protection: Post-menopausal  Other Topics Concern   Not on file  Social History Narrative   Patient lives at home alone. Patient has a high school education. Her sister comes in frequently to take care of medications, groceries. She now has an Aid that that comes in M-F for 16 hours a  week that helps her shower and do oral care.   Caffeine - one soda daily.   Social Determinants of Health   Financial Resource Strain: Low Risk  (03/04/2021)   Overall Financial Resource Strain (CARDIA)    Difficulty of Paying Living Expenses: Not hard at all  Food Insecurity: No Food Insecurity (03/04/2021)   Hunger Vital Sign    Worried About Running Out of Food in the Last Year: Never true    Ran Out of Food in the Last Year: Never true  Transportation Needs: No Transportation Needs (03/04/2021)   PRAPARE - Hydrologist (Medical): No    Lack of Transportation (Non-Medical): No  Physical Activity: Inactive (03/04/2021)   Exercise Vital Sign    Days of Exercise per Week: 0 days    Minutes of Exercise per Session: 0 min  Stress: No Stress Concern Present (03/04/2021)   [place]    Feeling of Stress : Not at all  Social Connections: Socially Isolated (03/04/2021)   Social Connection and Isolation Panel [NHANES]    Frequency of Communication with Friends and Family: More than three times a week    Frequency of Social Gatherings with Friends and Family: More than three times a week    Attends Religious Services: Never    Marine scientist or Organizations: No    Attends Archivist Meetings: Never    Marital Status: Never married  Intimate Partner Violence: Not At Risk (03/04/2021)   Humiliation, Afraid, Rape, and Kick questionnaire    Fear of Current or Ex-Partner: No    Emotionally Abused: No    Physically Abused: No    Sexually Abused: No     Review of System:  Question reliability General: Negative for anorexia, weight loss, fever, chills, fatigue, weakness. Eyes: Negative for vision changes.  ENT: Negative for hoarseness, difficulty swallowing , nasal congestion. CV: Negative for chest pain, angina, palpitations, dyspnea on exertion, peripheral edema.   Respiratory: Negative for dyspnea at rest, dyspnea on exertion, cough, sputum, wheezing.  GI: See history of present illness. GU:  Negative for dysuria, hematuria, urinary incontinence, urinary frequency, nocturnal urination.  MS: Negative for joint pain, low back pain.  Derm: Negative for rash or itching.  Neuro: Negative for weakness, abnormal sensation, seizure, frequent headaches, memory loss, confusion.  Psych: Negative for anxiety, depression, suicidal ideation, hallucinations.  Endo: Negative for unusual weight change.  Heme: Negative for bruising or bleeding. Allergy: Negative for rash or hives.      Physical Examination:   Vital signs in last 24 hours: Temp:  [97.6 F (36.4 C)-98.4 F (36.9 C)] 97.6 F (36.4 C) (02/15 0732) Pulse Rate:  [25-171] 56 (02/15 0730) Resp:  [10-32] 16 (02/15 0730) BP: (75-125)/(43-93) 92/62 (02/15 0730) SpO2:  [85 %-100 %] 99 % (02/15 0730) Last BM Date :  (PTA)  General: Well-nourished, well-developed in no acute distress.  Head: Normocephalic, atraumatic.  Eyes: Conjunctiva pink, no icterus. Mouth: Oropharyngeal mucosa moist and pink , no lesions erythema or exudate. Neck: Supple without thyromegaly, masses, or lymphadenopathy.  Lungs: Clear to auscultation bilaterally.  Heart: Regular rate and rhythm, no murmurs rubs or gallops.  Abdomen: Bowel sounds are normal, nontender, nondistended, no hepatosplenomegaly or masses, no abdominal bruits or hernia , no rebound or guarding.   Rectal: not performed Extremities: No lower extremity edema, clubbing, deformity.  Neuro: Alert and oriented x 4 , grossly normal neurologically.  Skin: Warm and dry, no rash or jaundice.   Psych: Alert and cooperative, normal mood and affect.        Intake/Output from previous day: 02/14 0701 - 02/15 0700 In: 1436.1 [I.V.:1336.1; IV Piggyback:100.1] Out: 1325 [Urine:1325] Intake/Output this shift: No intake/output data recorded.  Lab Results:    CBC Recent Labs    05/19/22 1030 05/21/22 0434 05/22/22 0452  WBC 10.0 10.0 9.9  HGB 11.4* 11.6* 12.1  HCT 32.8* 35.8* 36.4  MCV 95.9 101.4* 99.2  PLT 226 190 230   BMET Recent Labs    05/21/22 0434 05/21/22 2221 05/22/22 0452  NA 126* 127* 127*  K 5.3* 4.6 4.9  CL 100 98 99  CO2 18* 19* 19*  GLUCOSE 78 109* 125*  BUN 21 23 25*  CREATININE 1.00 1.12* 1.12*  CALCIUM 8.2* 8.3* 8.1*   LFT Recent Labs    05/20/22 0559 05/20/22 1517 05/21/22 0434 05/22/22 0452  BILITOT 0.7  --  0.8 0.8  ALKPHOS 96  --  109 187*  AST 150*  --  261* 600*  ALT 108*  --  168* 346*  PROT 5.8*  --  6.0* 6.2*  ALBUMIN 3.0* 2.1* 3.1* 3.1*    Lipase No results for input(s): "LIPASE" in the last 72 hours.  PT/INR No results for input(s): "LABPROT", "INR" in the last 72 hours.   Hepatitis Panel No results for input(s): "HEPBSAG", "HCVAB", "HEPAIGM", "HEPBIGM" in the last 72 hours.   Imaging Studies:   US Abdomen Limited RUQ (LIVER/GB)  Result Date: 05/21/2022 CLINICAL DATA:  Abnormal liver functions. EXAM: ULTRASOUND ABDOMEN LIMITED RIGHT UPPER QUADRANT COMPARISON:  CT examination dated 09/30/2018 FINDINGS: Gallbladder: There is a 2 cm shadowing gallstone. Bladder wall thickening measuring up to 6 mm. No sonographic Murphy sign noted by sonographer. Common bile duct: Diameter: 3.7 Liver: No focal lesion identified. Echogenic hepatic parenchyma concerning for hepatic steatosis. Portal vein is patent on color Doppler imaging with normal direction of blood flow towards the liver. Other: Right pleural effusion. IMPRESSION: 1. Cholelithiasis with gallbladder wall thickening. Negative sonographic Murphy sign. Findings are equivocal for acute cholecystitis. 2. Hepatic steatosis. 3. Small right pleural effusion. Electronically Signed   By: Keane Police D.O.   On: 05/21/2022 09:46   DG Chest 2 View  Result Date: 05/19/2022 CLINICAL DATA:  ams, fall with bruise to R upper back EXAM: CHEST - 2  VIEW COMPARISON:  Radiograph 04/03/2022 FINDINGS: Borderline enlarged cardiac silhouette. Low low lung volumes. Trace pleural effusions. No definite airspace consolidation. Bilateral shoulder degenerative changes. Thoracic spondylosis. No evidence of acute fracture. IMPRESSION: Borderline enlarged cardiac silhouette. Low lung volumes. Trace pleural effusions. No definite airspace consolidation. No evidence of acute osseous abnormality. Electronically Signed   By: Maurine Simmering M.D.   On: 05/19/2022 11:42   DG HIP UNILAT WITH PELVIS 2-3 VIEWS RIGHT  Result Date: 05/19/2022 CLINICAL DATA:  Bruising, fall EXAM: DG HIP (WITH OR WITHOUT PELVIS) 2-3V RIGHT COMPARISON:  Radiograph 08/29/2012 FINDINGS:  There is cortical irregularity of the greater trochanter. No evidence of femoral neck fracture. There is mild osteoarthritis of the hips. IMPRESSION: Cortical irregularity of the greater trochanter, could represent a nondisplaced fracture. Consider CT. Electronically Signed   By: Maurine Simmering M.D.   On: 05/19/2022 11:28   CT CERVICAL SPINE WO CONTRAST  Result Date: 05/19/2022 CLINICAL DATA:  72 year old female status post fall in bathtub over the weekend. EXAM: CT CERVICAL SPINE WITHOUT CONTRAST TECHNIQUE: Multidetector CT imaging of the cervical spine was performed without intravenous contrast. Multiplanar CT image reconstructions were also generated. RADIATION DOSE REDUCTION: This exam was performed according to the departmental dose-optimization program which includes automated exposure control, adjustment of the mA and/or kV according to patient size and/or use of iterative reconstruction technique. COMPARISON:  Head CT today.  Cervical spine CT 04/28/2016. FINDINGS: Alignment: Mildly improved cervical lordosis compared to 2018. Chronic degenerative appearing mild anterolisthesis of C4 on C5 has not significantly changed. Cervicothoracic junction alignment is within normal limits. Bilateral posterior element  alignment is within normal limits. Skull base and vertebrae: Visualized skull base is intact. No atlanto-occipital dissociation. C1 and C2 appear intact and aligned. No acute osseous abnormality identified. Soft tissues and spinal canal: No prevertebral fluid or swelling. No visible canal hematoma. Mild motion artifact. Disc levels: Chronic C2 through C3 posterior element and/or interbody ankylosis. Associated chronic C4-C5 spondylolisthesis and facet arthropathy which is greater on the right. And chronic disc and endplate degeneration [id number] and C6-C7. However, fairly capacious spinal canal. Doubt spinal stenosis. Upper chest: T1 inferior endplate deformity most resembling a Schmorl's node is present in 2018 but is larger. Evidence of a layering right pleural effusion. Otherwise negative lung apices. IMPRESSION: 1. No acute traumatic injury identified in the cervical spine. 2. Chronic cervical spine ankylosis and degeneration but no CT evidence of spinal stenosis. 3. Layering right pleural effusion visible in the upper chest. Electronically Signed   By: Genevie Ann M.D.   On: 05/19/2022 11:09   CT HEAD WO CONTRAST  Result Date: 05/19/2022 CLINICAL DATA:  72 year old female status post fall in bathtub over the weekend. EXAM: CT HEAD WITHOUT CONTRAST TECHNIQUE: Contiguous axial images were obtained from the base of the skull through the vertex without intravenous contrast. RADIATION DOSE REDUCTION: This exam was performed according to the departmental dose-optimization program which includes automated exposure control, adjustment of the mA and/or kV according to patient size and/or use of iterative reconstruction technique. COMPARISON:  Head CT 11/01/2019. FINDINGS: Brain: Chronic colpocephaly, and asymmetric atrophy or encephalomalacia in the left occipital lobe. This is associated with chronic coarse calcified dural thickening and posterior midline shift toward the smaller hemisphere. Chronic cerebellar volume loss  also which is more pronounced on the right. No evidence of transependymal edema. Basilar cisterns remain patent. No superimposed No midline shift, ventriculomegaly, mass effect, evidence of mass lesion, intracranial hemorrhage or evidence of cortically based acute infarction. Vascular: Calcified atherosclerosis at the skull base. No suspicious intracranial vascular hyperdensity. Skull: No acute osseous abnormality identified. Chronic hyperostosis. Sinuses/Orbits: Visualized paranasal sinuses and mastoids are clear. Other: No acute orbit or scalp soft tissue injury identified. IMPRESSION: 1. No acute intracranial abnormality or acute traumatic injury identified. 2. Chronic and likely 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, colpocephaly. Coarse dural calcification and hyperostosis of calvarium. Electronically Signed   By: Genevie Ann M.D.   On: 05/19/2022 11:06  [4 week]  Assessment:   Pleasant 72 year old female with history of seizures, intellectual disability in  the setting of cerebral ventriculomegaly, hearing impairment, GERD, hypothyroidism, schizophrenia, hypertension who presented to the ED due to falls, weakness, altered mentation.  New A-fib with RVR discovered on presentation.  She had severe hyponatremia with sodium down to 114 with recent history of "stomach bug".  Possible nondisplaced fracture of the greater trochanter on the right.  GI consulted for rising LFTs.  Elevated LFTs: LFTs normal in December.  On admission AST 129, ALT 102.  Yesterday AST 261, ALT 168.  Today alk phos up to 187, AST 600, ALT 346, total bilirubin normal. UDS negative. Ethanol level negative. RUQ U/S with 2cm gallstone, gb wall thickening up to 31m with negative Murphy's sign. Nontender on exam today. Tolerating diet. Nocturnal BPs low with systolic in the [id number]  Patient's elevated LFTs are most consistent with hepatocellular pattern. Normal LFTs 03/2022 but mild elevation on presentation with continued  rise. Will rule out Hep B/C, iron overload given pattern, however medication effect may be playing a role. Low grade ischemia possible as well. Other viral illness on the differential. Acute gallbladder disease does not appear to be a concern given presentation and lack of abdominal pain.   Amiodarone initially held off by cardiology due to rising LFTs, although was started overnight by attending given persistent RVR and inability to uptitrate standard AV nodal blockers. Cardiology considers this temporary measure in light of her transaminitis.   Plan:   Avoid hepatotoxic medications as feasible.  Further serologies.  Trend LFTs/INR.    LOS: 3 days   We would like to thank you for the opportunity to participate in the care of Pam Sanders  Pam Sanders [phone]/15/[id number]:50 AM

## 2022-05-22 NOTE — Progress Notes (Signed)
Physical Therapy Treatment Patient Details Name: Pam Sanders MRN: [id number] DOB: 24-Apr-1950 Today's Date: 05/22/2022   History of Present Illness Pam Sanders  is a 72 y.o. female with past medical history relevant for history of seizures and intellectual disability in the setting of cerebral ventriculomegaly, GERD, hypothyroidism, schizophrenia and HTN presented to the ED with falls and weakness/lethargy and altered mentation  -  Additional history obtained from patient's sister  [name] at bedside  -Apparently patient had a stomach bug with loss of emesis and a bit of diarrhea last week--  -she was taking Lasix and Aldactone  =-She became very weak and started to have falls and ambulatory dysfunction as well as very very poor oral intake over the last week or so  -Apparently no fevers or productive cough    PT Comments    Patient requires max assist with HOB elevated to transition to seated EOB. She demonstrates poor sitting balance and impaired sitting tolerance at bedside and is limited by fatigue requiring assist to return to supine. Patient limited for functional mobility as stated below secondary to BLE weakness, fatigue and poor standing balance.  Patient will benefit from continued physical therapy in hospital and recommended venue below to increase strength, balance, endurance for safe ADLs and gait.    Recommendations for follow up therapy are one component of a multi-disciplinary discharge planning process, led by the attending physician.  Recommendations may be updated based on patient status, additional functional criteria and insurance authorization.  Follow Up Recommendations  Skilled nursing-short term rehab (<3 hours/day) Can patient physically be transported by private vehicle: No   Assistance Recommended at Discharge Intermittent Supervision/Assistance  Patient can return home with the following A lot of help with bathing/dressing/bathroom;A lot of help with walking  and/or transfers;Help with stairs or ramp for entrance;Assistance with cooking/housework   Equipment Recommendations  None recommended by PT    Recommendations for Other Services       Precautions / Restrictions Precautions Precautions: Fall Restrictions Weight Bearing Restrictions: No     Mobility  Bed Mobility Overal bed mobility: Needs Assistance Bed Mobility: Rolling, Sidelying to Sit, Sit to Supine, Supine to Sit Rolling: Mod assist Sidelying to sit: Max assist, HOB elevated Supine to sit: Max assist, Mod assist, HOB elevated Sit to supine: Max assist   General bed mobility comments: assist for LE mobility and to upright trunk, frequent cueing    Transfers                        Ambulation/Gait                   Stairs             Wheelchair Mobility    Modified Rankin (Stroke Patients Only)       Balance Overall balance assessment: Needs assistance Sitting-balance support: Feet supported, No upper extremity supported Sitting balance-Leahy Scale: Poor Sitting balance - Comments: fair/poor seated at EOB                                    Cognition Arousal/Alertness: Awake/alert Behavior During Therapy: WFL for tasks assessed/performed Overall Cognitive Status: History of cognitive impairments - at baseline  Exercises      General Comments        Pertinent Vitals/Pain Pain Assessment Pain Assessment: Faces Faces Pain Scale: Hurts whole lot Pain Location: low back during bed mobility Pain Descriptors / Indicators: Sore, Grimacing, Guarding, Moaning Pain Intervention(s): Limited activity within patient's tolerance, Monitored during session, Repositioned    Home Living                          Prior Function            PT Goals (current goals can now be found in the care plan section) Acute Rehab PT Goals Patient Stated Goal: return  home after rehab PT Goal Formulation: With patient Time For Goal Achievement: 06/04/22 Potential to Achieve Goals: Good Progress towards PT goals: Progressing toward goals    Frequency    Min 3X/week      PT Plan Current plan remains appropriate    Co-evaluation              AM-PAC PT "6 Clicks" Mobility   Outcome Measure  Help needed turning from your back to your side while in a flat bed without using bedrails?: A Lot Help needed moving from lying on your back to sitting on the side of a flat bed without using bedrails?: A Lot Help needed moving to and from a bed to a chair (including a wheelchair)?: A Lot Help needed standing up from a chair using your arms (e.g., wheelchair or bedside chair)?: A Lot Help needed to walk in hospital room?: A Lot Help needed climbing 3-5 steps with a railing? : Total 6 Click Score: 11    End of Session   Activity Tolerance: Patient tolerated treatment well;Patient limited by fatigue Patient left: in bed;with call bell/phone within reach;with bed alarm set Nurse Communication: Mobility status PT Visit Diagnosis: Unsteadiness on feet (R26.81);Other abnormalities of gait and mobility (R26.89);Muscle weakness (generalized) (M62.81)     Time: [id number] PT Time Calculation (min) (ACUTE ONLY): 9 min  Charges:  $Therapeutic Activity: 8-22 mins                     10:14 AM, 05/22/22 Mearl Latin PT, DPT Physical Therapist at [facility]

## 2022-05-22 NOTE — Progress Notes (Signed)
Pt a-fib RVR, attempted to give Metoprolol 64m IV without decrease in HR, orders from Dr. ZJosph Machoto give IVF bolus 2565m Amio bolus and start Amio gtt after. Pt later complained of nonspecific CP 12 lead showed a-fib RVR, troponin 14. Pt given 21m65mV morphine. Pt currently says she has no pain and appears to be resting comfortably. BP soft continues in a-fib HR 80-130.

## 2022-05-22 NOTE — Progress Notes (Signed)
  Echocardiogram 2D Echocardiogram has been performed.  Pam Sanders 05/22/2022, 12:09 PM

## 2022-05-22 NOTE — Progress Notes (Signed)
Patient noted to not have voided since 7am. Bladder scan performed at 1515 with result of 426m. Dr JWynetta Emerymade aware. In and out cath performed with 7061murine output result. Dr JoWynetta Emeryade aware.

## 2022-05-23 ENCOUNTER — Telehealth: Payer: Self-pay | Admitting: Family Medicine

## 2022-05-23 ENCOUNTER — Inpatient Hospital Stay (HOSPITAL_COMMUNITY): Payer: 59

## 2022-05-23 DIAGNOSIS — I10 Essential (primary) hypertension: Secondary | ICD-10-CM

## 2022-05-23 DIAGNOSIS — E871 Hypo-osmolality and hyponatremia: Secondary | ICD-10-CM | POA: Diagnosis not present

## 2022-05-23 DIAGNOSIS — F259 Schizoaffective disorder, unspecified: Secondary | ICD-10-CM

## 2022-05-23 DIAGNOSIS — I5043 Acute on chronic combined systolic (congestive) and diastolic (congestive) heart failure: Secondary | ICD-10-CM

## 2022-05-23 DIAGNOSIS — E039 Hypothyroidism, unspecified: Secondary | ICD-10-CM

## 2022-05-23 DIAGNOSIS — K219 Gastro-esophageal reflux disease without esophagitis: Secondary | ICD-10-CM | POA: Diagnosis not present

## 2022-05-23 LAB — PROTIME-INR
INR: 1.6 — ABNORMAL HIGH (ref 0.8–1.2)
Prothrombin Time: 18.4 seconds — ABNORMAL HIGH (ref 11.4–15.2)

## 2022-05-23 LAB — COMPREHENSIVE METABOLIC PANEL
ALT: 622 U/L — ABNORMAL HIGH (ref 0–44)
AST: 1134 U/L — ABNORMAL HIGH (ref 15–41)
Albumin: 3.1 g/dL — ABNORMAL LOW (ref 3.5–5.0)
Alkaline Phosphatase: 369 U/L — ABNORMAL HIGH (ref 38–126)
Anion gap: 11 (ref 5–15)
BUN: 28 mg/dL — ABNORMAL HIGH (ref 8–23)
CO2: 18 mmol/L — ABNORMAL LOW (ref 22–32)
Calcium: 8.5 mg/dL — ABNORMAL LOW (ref 8.9–10.3)
Chloride: 97 mmol/L — ABNORMAL LOW (ref 98–111)
Creatinine, Ser: 1.47 mg/dL — ABNORMAL HIGH (ref 0.44–1.00)
GFR, Estimated: 38 mL/min — ABNORMAL LOW (ref >60)
Glucose, Bld: 162 mg/dL — ABNORMAL HIGH (ref 70–99)
Potassium: 5.3 mmol/L — ABNORMAL HIGH (ref 3.5–5.1)
Sodium: 126 mmol/L — ABNORMAL LOW (ref 135–145)
Total Bilirubin: 0.9 mg/dL (ref 0.3–1.2)
Total Protein: 6.2 g/dL — ABNORMAL LOW (ref 6.5–8.1)

## 2022-05-23 MED ORDER — FUROSEMIDE 10 MG/ML IJ SOLN
20.0000 mg | Freq: Every day | INTRAMUSCULAR | Status: DC
  Administered 2022-05-23: 20 mg via INTRAVENOUS
  Filled 2022-05-23: qty 2

## 2022-05-23 MED ORDER — SODIUM ZIRCONIUM CYCLOSILICATE 10 G PO PACK
10.0000 g | PACK | Freq: Once | ORAL | Status: AC
  Administered 2022-05-23: 10 g via ORAL
  Filled 2022-05-23: qty 1

## 2022-05-23 NOTE — Progress Notes (Signed)
GI following peripherally, patient not examined. Family is awaiting palliative care consult. Patient is DNR.  RUQ [place] 2/14 with 2 cm shadowing gallstone and gallbladder wall thickening measuring 6 mm, hepatic steatosis, CBD 3.7 mm. Negative sonographic murphy's sign.   Has been having soft evening blood pressures with systolic as low as [id number]. Also with episodes of bradycardia.   LFTs normal in December 2023. Admission LFTs with AST 129, ALT 102 and continues to rise.  Ferritin 905 (likely reactive)  Today's labs: K 5.3 Cr 1.47 Na 126 Albumin 3.1 AST 1134 (600) ALT 622 (346) Alk Ph 369 (187) T bili 0.9 INR 1.6  Liver doppler today with patent portal venus system.   Echo completed 05/22/22: LVEF <20%, RV function moderately reduced and enlarged. Moderate MR.  Cardiology following and patient in Afib now on amiodarone and lopressor  Transaminitis likely secondary to hepatic congestion from heart failure and possible ischemia given quick rise in numbers that represent hepatocellular pattern.   Could consider further serologic workup for viral hepatitis but given patients condition these are less likely contributors to etiology. Agree with palliative care involvement. Additional serologies and further workup can be completed if considered necessary and no transition to palliative/comfort. Unfortunately no palliative care services available until Monday.  Continue supportive measures and trend LFTs and INR.   Venetia Night, MSN, APRN, FNP-BC, AGACNP-BC [facility] at [facility]

## 2022-05-23 NOTE — Telephone Encounter (Signed)
Pts sister called to let PCP know that pt is still in ICU and the family is being told by the doctors that pt only has a few weeks left to live. Says pts heart is only functioning at 20% and that they are going to call hospice/pallative care in.

## 2022-05-23 NOTE — Progress Notes (Signed)
PROGRESS NOTE   Pam Sanders  [id number] DOB: December 12, 1950 DOA: 05/19/2022 PCP: [name], FNP   Chief Complaint  Patient presents with   Fall   Level of care: Stepdown  Brief Admission History:  72 y.o. female with past medical history relevant for history of seizures and intellectual disability in the setting of cerebral ventriculomegaly, GERD, hypothyroidism, schizophrenia and HTN presented to the ED with falls and weakness/lethargy and altered mentation.  Pt was found to have new atrial fibrillation with RVR.  Pt was treated for a severe hyponatremia with a sodium down to 114.  Cardiology consulted 2/14 for persistent afib RVR.  Pt noted to have rising LFTs that is being investigated.    Assessment and Plan:  Symptomatic Severe Hyponatremia----factorial in the setting of recent stomach bug with vomiting or diarrhea, compounded by Lasix and Aldactone use as well as poor oral intake sodium of 114 (sodium was 141 on 10/01/2021) -Patient with metabolic encephalopathy, gait problems- -Sodium is up to 127 -completed 3% saline solution -stop normal saline now -working on palliative care now -added lasix 20 mg daily    Newly discovered Cardiomyopathy -discussed with cardiology  -pt has biventricular heart failure with LVEF <20%  -discussed with cardiology, recommendation is to pursue palliative medicine -I discussed with patient's family and they are agreeable to palliative care at SNF, they are not able to care for patient at home; will ask [facility] for SNF placement with palliative care;  -consulted palliative care for goals of care discussions with family  -working on appropriate disposition, if continues to worsen would consider residential hospice  Elevated LFTs / Transaminitis  -LFTs continues to rise; thought secondary to hepatic congestion from right heart failure -right upper quadrant ultrasound -->findings of hepatic steatosis, equivocal findings for acute  cholecystitis; cholelithiasis and wall thickening   New onset A-fib with RVR----IV metoprolol alone was ineffective at controlling HR -cardiology consulted, restarted diltiazem;  -EKG is consistent with A-fib with RVR -echo requested and pending -Elevated TSH noted, normal Free T4 reassuring   Recurrent Falls/Ambulatory Dysfunction and weakness--- due to severe hyponatremia -CT head without acute findings but shows 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 ventriculomegaly) -CT C-spine without acute findings -PT eval recommending SNF placement    Possible right greater trochanter fracture--- Hip and pelvic x-rays with possible nondisplaced fracture of the greater trochanter on the right -continue conservative management -Get PT eval -- SNF recommended   Hypothyroidism----TSH is 10.2 -Free T4 at 1.07 -continue levothyroxine supplementation   Hyperkalemia -added lokelma x 3 doses -treated and resolved    CKD 3A with hyperkalemia---  -Creatinine trending down with hydration  -Potassium normalized after Lokelma - renally adjust medications, avoid nephrotoxic agents / dehydration  / hypotension   Epilepsy/history of seizures-- history of seizures and intellectual disability in the setting of cerebral ventriculomegaly- -no recent seizures -Not on antiepileptic agents PTA -Monitor closely especially with severe hyponatremia and risk for seizures   UTI--- treated with ceftriaxone  Candidal Intertrigo - ordered nizoral creme topical treatment    DVT prophylaxis: SCDs Code Status: Full  Family Communication: discussion with sister/brother in law at bedside  Disposition: Status is: Inpatient Remains inpatient appropriate because: intensity of illness   Consultants:  Cardiology  Procedures:   Antimicrobials:     Subjective: Pt denies chest pain and mild SOB.   Objective: Vitals:   05/23/22 0930 05/23/22 0947 05/23/22  1000 05/23/22 1030  BP: 126/78 126/78 105/79  Pulse: (!) 123 (!) 110 (!) 112 (!) 120  Resp: (!) 26  (!) 29 17  Temp:      TempSrc:      SpO2: 92%  97% 100%  Weight:      Height:        Intake/Output Summary (Last 24 hours) at 05/23/2022 1122 Last data filed at 05/23/2022 1056 Gross per 24 hour  Intake 1062.99 ml  Output 825 ml  Net 237.99 ml   Filed Weights   05/19/22 1400 05/21/22 0452 05/23/22 0500  Weight: 97.4 kg 100.7 kg 102.3 kg   Examination:  General exam: awake, alert, cooperative, NAD, Appears calm and comfortable  Respiratory system: bibasilar crackles. Shallow breathing. Cardiovascular system: irregularly irregular and tachycardic, normal S1 & S2 heard. No JVD, murmurs, rubs, gallops or clicks. 1+ pedal edema. Gastrointestinal system: Abdomen is nondistended, soft and nontender. No organomegaly or masses felt. Normal bowel sounds heard. Central nervous system: Alert and oriented. No focal neurological deficits. Extremities: 1+ edema BLEs. Symmetric 5 x 5 power. Skin: No rashes, lesions or ulcers. Psychiatry: Mood & affect appropriate.   Data Reviewed: I have personally reviewed following labs and imaging studies  CBC: Recent Labs  Lab 05/19/22 1030 05/21/22 0434 05/22/22 0452  WBC 10.0 10.0 9.9  NEUTROABS 7.7  --   --   HGB 11.4* 11.6* 12.1  HCT 32.8* 35.8* 36.4  MCV 95.9 101.4* 99.2  PLT 226 190 [id number]    Basic Metabolic Panel: Recent Labs  Lab 05/20/22 1517 05/21/22 0434 05/21/22 2221 05/22/22 0452 05/23/22 0445  NA 128* 126* 127* 127* 126*  K 2.9* 5.3* 4.6 4.9 5.3*  CL 107 100 98 99 97*  CO2 15* 18* 19* 19* 18*  GLUCOSE 76 78 109* 125* 162*  BUN 19 21 23 $ 25* 28*  CREATININE 0.68 1.00 1.12* 1.12* 1.47*  CALCIUM 5.9* 8.2* 8.3* 8.1* 8.5*  MG  --   --  2.1 2.1  --   PHOS 2.7  --   --   --   --     CBG: Recent Labs  Lab 05/19/22 1032  GLUCAP 108*    Recent Results (from the past 240 hour(s))  MRSA Next Gen by PCR, Nasal     Status:  None   Collection Time: 05/19/22  2:00 PM   Specimen: Nasal Mucosa; Nasal Swab  Result Value Ref Range Status   MRSA by PCR Next Gen NOT DETECTED NOT DETECTED Final    Comment: (NOTE) The GeneXpert MRSA Assay (FDA approved for NASAL specimens only), is one component of a comprehensive MRSA colonization surveillance program. It is not intended to diagnose MRSA infection nor to guide or monitor treatment for MRSA infections. Test performance is not FDA approved in patients less than 44 years old. Performed at [facility], [address] [place], [place] [address]   Resp panel by RT-PCR (RSV, Flu A&B, Covid) Anterior Nasal Swab     Status: None   Collection Time: 05/19/22  2:10 PM   Specimen: Anterior Nasal Swab  Result Value Ref Range Status   SARS Coronavirus 2 by RT PCR NEGATIVE NEGATIVE Final    Comment: (NOTE) SARS-CoV-2 target nucleic acids are NOT DETECTED.  The SARS-CoV-2 RNA is generally detectable in upper respiratory specimens during the acute phase of infection. The lowest concentration of SARS-CoV-2 viral copies this assay can detect is 138 copies/mL. A negative result does not preclude SARS-Cov-2 infection and should not be used as the sole basis for treatment  or other patient management decisions. A negative result may occur with  improper specimen collection/handling, submission of specimen other than nasopharyngeal swab, presence of viral mutation(s) within the areas targeted by this assay, and inadequate number of viral copies(<138 copies/mL). A negative result must be combined with clinical observations, patient history, and epidemiological information. The expected result is Negative.  Fact Sheet for Patients:  EntrepreneurPulse.com.au  Fact Sheet for Healthcare Providers:  IncredibleEmployment.be  This test is no t yet approved or cleared by the [place] FDA and  has been authorized for detection and/or diagnosis of  SARS-CoV-2 by FDA under an Emergency Use Authorization (EUA). This EUA will remain  in effect (meaning this test can be used) for the duration of the COVID-19 declaration under Section 564(b)(1) of the Act, 21 U.S.C.section 360bbb-3(b)(1), unless the authorization is terminated  or revoked sooner.       Influenza A by PCR NEGATIVE NEGATIVE Final   Influenza B by PCR NEGATIVE NEGATIVE Final    Comment: (NOTE) The Xpert Xpress SARS-CoV-2/FLU/RSV plus assay is intended as an aid in the diagnosis of influenza from Nasopharyngeal swab specimens and should not be used as a sole basis for treatment. Nasal washings and aspirates are unacceptable for Xpert Xpress SARS-CoV-2/FLU/RSV testing.  Fact Sheet for Patients: EntrepreneurPulse.com.au  Fact Sheet for Healthcare Providers: IncredibleEmployment.be  This test is not yet approved or cleared by the [place] FDA and has been authorized for detection and/or diagnosis of SARS-CoV-2 by FDA under an Emergency Use Authorization (EUA). This EUA will remain in effect (meaning this test can be used) for the duration of the COVID-19 declaration under Section 564(b)(1) of the Act, 21 U.S.C. section 360bbb-3(b)(1), unless the authorization is terminated or revoked.     Resp Syncytial Virus by PCR NEGATIVE NEGATIVE Final    Comment: (NOTE) Fact Sheet for Patients: EntrepreneurPulse.com.au  Fact Sheet for Healthcare Providers: IncredibleEmployment.be  This test is not yet approved or cleared by the [place] FDA and has been authorized for detection and/or diagnosis of SARS-CoV-2 by FDA under an Emergency Use Authorization (EUA). This EUA will remain in effect (meaning this test can be used) for the duration of the COVID-19 declaration under Section 564(b)(1) of the Act, 21 U.S.C. section 360bbb-3(b)(1), unless the authorization is terminated  or revoked.  Performed at [facility], [address] [place], [place] [address]   Urine Culture     Status: None   Collection Time: 05/19/22  4:35 PM   Specimen: Urine, Clean Catch  Result Value Ref Range Status   Specimen Description   Final    URINE, CLEAN CATCH Performed at [facility], [address] [place], [place] [address]    Special Requests   Final    NONE Performed at [facility], [address], [place], [place] [address]    Culture   Final    NO GROWTH Performed at [facility], [place] [address], [place],  [address]    Report Status 05/21/2022 FINAL  Final     Radiology Studies: US ABDOMEN LIMITED WITH LIVER DOPPLER  Result Date: 05/23/2022 CLINICAL DATA:  Elevated LFTs. EXAM: DUPLEX ULTRASOUND OF LIVER TECHNIQUE: Color and duplex Doppler ultrasound was performed to evaluate the hepatic in-flow and out-flow vessels. COMPARISON:  Abdominal ultrasound 05/21/2022 FINDINGS: Liver: Limited evaluation of the liver parenchyma on this examination. Refer to the recent abdominal ultrasound. Main Portal Vein size: 0.9 cm Portal Vein Velocities Main Prox:  19 cm/sec Main Mid: 21 cm/sec  Main Dist:  18 cm/sec Right: 19 cm/sec Left: 36 cm/sec Hepatic Vein Velocities Right:  84 cm/sec Middle:  75 cm/sec Left:  86 cm/sec IVC: Present and patent with normal respiratory phasicity. Hepatic Artery Velocity:  100 cm/sec Splenic Vein Velocity:  41 cm/sec Spleen: 8.0 cm x 7.0 cm x 2.1 cm with a total volume of 61 cm^3 (411 cm^3 is upper limit normal) Portal Vein Occlusion/Thrombus: No Splenic Vein Occlusion/Thrombus: No Ascites: None Varices: None Normal hepatopetal flow in the portal veins. Normal hepatofugal flow in the hepatic veins. IMPRESSION: Portal venous system is patent with normal direction of flow. Electronically Signed   By: Markus Daft M.D.   On: 05/23/2022 11:18   ECHOCARDIOGRAM COMPLETE  Result Date: 05/22/2022    ECHOCARDIOGRAM REPORT   Patient Name:   KEMONI VLAHAKIS Date of Exam: 05/22/2022 Medical Rec #:  [id number]        Height:       56.0 in Accession #:    [id number]       Weight:       222.0 lb Date of Birth:  1950/09/21        BSA:          1.856 m Patient Age:    48 years         BP:           112/78 mmHg Patient Gender: F                HR:           119 bpm. Exam Location:  [facility] Procedure: 2D Echo, Cardiac Doppler and Color Doppler Indications:    atrial fibrillation  History:        Patient has prior history of Echocardiogram examinations, most                 recent 04/06/2016. Chronic kidney disease; Risk                 Factors:Hypertension.  Sonographer:    Johny Chess RDCS Referring Phys: [place]  1. Left ventricular ejection fraction, by estimation, is <20%. The left ventricle has severely decreased function. The left ventricle demonstrates regional wall motion abnormalities (see scoring diagram/findings for description). Left ventricular diastolic parameters are indeterminate.  2. Right ventricular systolic function is moderately reduced. The right ventricular size is mildly enlarged. There is mildly elevated pulmonary artery systolic pressure. The estimated right ventricular systolic pressure is [id number] mmHg.  3. Left atrial size was moderately dilated.  4. The mitral valve is degenerative. Moderate mitral valve regurgitation.  5. Tricuspid valve regurgitation is moderate to severe.  6. The aortic valve is tricuspid. Aortic valve regurgitation is not visualized.  7. The inferior vena cava is dilated in size with <50% respiratory variability, suggesting right atrial pressure of 15 mmHg. Comparison(s): Prior images unable to be directly viewed. FINDINGS  Left Ventricle: Left ventricular ejection fraction, by estimation, is <20%. The left ventricle has severely decreased function. The left ventricle demonstrates regional wall motion abnormalities. The left ventricular internal cavity size was normal in size. There is  no left ventricular hypertrophy. Left ventricular diastolic function could not be evaluated due to atrial fibrillation. Left ventricular diastolic parameters are indeterminate.  LV Wall Scoring: The entire anterior wall, entire septum, entire apex, and entire inferior wall are akinetic. The antero-lateral wall and mid inferolateral segment are hypokinetic. The basal inferolateral segment is normal. Right Ventricle: The right ventricular  size is mildly enlarged. No increase in right ventricular wall thickness. Right ventricular systolic function is moderately reduced. There is mildly elevated pulmonary artery systolic pressure. The tricuspid regurgitant velocity is 2.49 m/s, and with an assumed right atrial pressure of 15 mmHg, the estimated right ventricular systolic pressure is [id number] mmHg. Left Atrium: Left atrial size was moderately dilated. Right Atrium: Right atrial size was normal in size. Pericardium: Trivial pericardial effusion is present. The pericardial effusion is posterior to the left ventricle. Mitral Valve: The mitral valve is degenerative in appearance. Moderate mitral valve regurgitation. Tricuspid Valve: The tricuspid valve is grossly normal. Tricuspid valve regurgitation is moderate to severe. Aortic Valve: The aortic valve is tricuspid. There is mild aortic valve annular calcification. Aortic valve regurgitation is not visualized. Pulmonic Valve: The pulmonic valve was grossly normal. Pulmonic valve regurgitation is trivial. Aorta: The aortic root is normal in size and structure. Venous: The inferior vena cava is dilated in size with less than 50% respiratory variability, suggesting right atrial pressure of 15 mmHg. IAS/Shunts: No atrial level shunt detected by color flow Doppler.  LEFT VENTRICLE PLAX 2D LVIDd:         5.00 cm LVIDs:         4.50 cm LV PW:         0.90 cm LV IVS:        0.60 cm LVOT diam:     1.60 cm LVOT Area:     2.01 cm  RIGHT VENTRICLE RV Basal diam:  2.40 cm RV S prime:      8.05 cm/s TAPSE (M-mode): 1.1 cm LEFT ATRIUM           Index        RIGHT ATRIUM           Index LA diam:      3.90 cm 2.10 cm/m   RA Area:     17.10 cm LA Vol (A4C): 86.9 ml 46.83 ml/m  RA Volume:   44.50 ml  23.98 ml/m   AORTA Ao Root diam: 2.60 cm Ao Asc diam:  2.80 cm TRICUSPID VALVE TR Peak grad:   24.8 mmHg TR Vmax:        249.00 cm/s  SHUNTS Systemic Diam: 1.60 cm Rozann Lesches MD Electronically signed by Rozann Lesches MD Signature Date/Time: 05/22/2022/1:17:48 PM    Final     Scheduled Meds:  calcium carbonate  1,250 mg Oral Q breakfast   Chlorhexidine Gluconate Cloth  6 each Topical Daily   cholecalciferol  1,000 Units Oral Daily   famotidine  20 mg Oral QHS   furosemide  20 mg Intravenous Daily   heparin  5,000 Units Subcutaneous Q8H   ketoconazole   Topical BID   levothyroxine  150 mcg Oral QAC breakfast   linaclotide  145 mcg Oral QAC breakfast   metoprolol tartrate  12.5 mg Oral TID   multivitamin with minerals  1 tablet Oral Daily   pantoprazole  40 mg Oral Daily   polyethylene glycol  17 g Oral BID   sodium chloride flush  3 mL Intravenous Q12H   sodium chloride flush  3 mL Intravenous Q12H   Continuous Infusions:  sodium chloride     sodium chloride Stopped (05/23/22 0518)   amiodarone 30 mg/hr (05/23/22 1056)     LOS: 4 days   Critical Care Procedure Note Authorized and Performed by: Murvin Natal MD  Total Critical Care time:  55 mins Due to a high probability of clinically  significant, life threatening deterioration, the patient required my highest level of preparedness to intervene emergently and I personally spent this critical care time directly and personally managing the patient.  This critical care time included obtaining a history; examining the patient, pulse oximetry; ordering and review of studies; arranging urgent treatment with development of a management plan; evaluation of patient's response of treatment; frequent reassessment; and discussions with  other providers.  This critical care time was performed to assess and manage the high probability of imminent and life threatening deterioration that could result in multi-organ failure.  It was exclusive of separately billable procedures and treating other patients and teaching time.    Irwin Brakeman, MD How to contact the [facility] Attending or Consulting provider [place] or covering provider during after hours [place], for this patient?  Check the care team in [facility] and look for a) attending/consulting [facility] provider listed and b) the [facility] team listed Log into [email or web address] and use [place]'s universal password to access. If you do not have the password, please contact the hospital operator. Locate the [facility] provider you are looking for under [facility] and page to a number that you can be directly reached. If you still have difficulty reaching the provider, please page the [facility] (Director on Call) for the Hospitalists listed on amion for assistance.  05/23/2022, 11:22 AM

## 2022-05-23 NOTE — Progress Notes (Signed)
Progress Note  Patient Name: Pam Sanders Date of Encounter: 05/23/2022  Primary Cardiologist: Domenic Polite   Subjective   No complaints cognitive defect   Inpatient Medications    Scheduled Meds:  calcium carbonate  1,250 mg Oral Q breakfast   Chlorhexidine Gluconate Cloth  6 each Topical Daily   cholecalciferol  1,000 Units Oral Daily   famotidine  20 mg Oral QHS   heparin  5,000 Units Subcutaneous Q8H   ketoconazole   Topical BID   levothyroxine  150 mcg Oral QAC breakfast   linaclotide  145 mcg Oral QAC breakfast   metoprolol tartrate  12.5 mg Oral TID   multivitamin with minerals  1 tablet Oral Daily   pantoprazole  40 mg Oral Daily   polyethylene glycol  17 g Oral BID   sodium chloride flush  3 mL Intravenous Q12H   sodium chloride flush  3 mL Intravenous Q12H   sodium zirconium cyclosilicate  10 g Oral Once   Continuous Infusions:  sodium chloride     sodium chloride 10 mL/hr at 05/22/22 1613   amiodarone 30 mg/hr (05/22/22 2129)   PRN Meds: sodium chloride, acetaminophen **OR** acetaminophen, bisacodyl, metoprolol tartrate, morphine injection, mouth rinse, polyethylene glycol, sodium chloride flush, traZODone   Vital Signs    Vitals:   05/23/22 0530 05/23/22 0600 05/23/22 0700 05/23/22 0800  BP: (!) 88/70 91/66 90/69 $ 97/81  Pulse: (!) 114 (!) 103 (!) 115 98  Resp: (!) 34 14 (!) 23 (!) 27  Temp:      TempSrc:      SpO2: 94% 99% 96% 100%  Weight:      Height:        Intake/Output Summary (Last 24 hours) at 05/23/2022 0809 Last data filed at 05/23/2022 0532 Gross per 24 hour  Intake 845.58 ml  Output 825 ml  Net 20.58 ml   Filed Weights   05/19/22 1400 05/21/22 0452 05/23/22 0500  Weight: 97.4 kg 100.7 kg 102.3 kg    Telemetry    Atrial fibrillation.  Personally reviewed.  ECG    No ECG reviewed.  Physical Exam   GEN: No acute distress.   Neck: No JVD. Cardiac: Irregularly irregular without gallop.  Respiratory: Nonlabored. Clear to  auscultation bilaterally. GI: Soft, nontender, bowel sounds present. MS: No edema; No deformity. Neuro:  Nonfocal. Psych: Alert and oriented x 3. Normal affect.  Labs    Chemistry Recent Labs  Lab 05/21/22 0434 05/21/22 2221 05/22/22 0452 05/23/22 0445  NA 126* 127* 127* 126*  K 5.3* 4.6 4.9 5.3*  CL 100 98 99 97*  CO2 18* 19* 19* 18*  GLUCOSE 78 109* 125* 162*  BUN 21 23 25* 28*  CREATININE 1.00 1.12* 1.12* 1.47*  CALCIUM 8.2* 8.3* 8.1* 8.5*  PROT 6.0*  --  6.2* 6.2*  ALBUMIN 3.1*  --  3.1* 3.1*  AST 261*  --  600* 1,134*  ALT 168*  --  346* 622*  ALKPHOS 109  --  187* 369*  BILITOT 0.8  --  0.8 0.9  GFRNONAA >60 53* 53* 38*  ANIONGAP 8 10 9 11     $ Hematology Recent Labs  Lab 05/19/22 1030 05/21/22 0434 05/22/22 0452  WBC 10.0 10.0 9.9  RBC 3.42* 3.53* 3.67*  HGB 11.4* 11.6* 12.1  HCT 32.8* 35.8* 36.4  MCV 95.9 101.4* 99.2  MCH 33.3 32.9 33.0  MCHC 34.8 32.4 33.2  RDW 14.3 15.6* 16.1*  PLT 226 190 230  Cardiac Enzymes Recent Labs  Lab 05/21/22 2221  TROPONINIHS 14    Radiology    ECHOCARDIOGRAM COMPLETE  Result Date: 05/22/2022    ECHOCARDIOGRAM REPORT   Patient Name:   Pam Sanders Date of Exam: 05/22/2022 Medical Rec #:  [id number]        Height:       56.0 in Accession #:    [id number]       Weight:       222.0 lb Date of Birth:  02-03-1951        BSA:          1.856 m Patient Age:    72 years         BP:           112/78 mmHg Patient Gender: F                HR:           119 bpm. Exam Location:  [facility] Procedure: 2D Echo, Cardiac Doppler and Color Doppler Indications:    atrial fibrillation  History:        Patient has Sanders history of Echocardiogram examinations, most                 recent 04/06/2016. Chronic kidney disease; Risk                 Factors:Hypertension.  Sonographer:    Johny Chess RDCS Referring Phys: [place]  1. Left ventricular ejection fraction, by estimation, is <20%. The left ventricle  has severely decreased function. The left ventricle demonstrates regional wall motion abnormalities (see scoring diagram/findings for description). Left ventricular diastolic parameters are indeterminate.  2. Right ventricular systolic function is moderately reduced. The right ventricular size is mildly enlarged. There is mildly elevated pulmonary artery systolic pressure. The estimated right ventricular systolic pressure is [id number] mmHg.  3. Left atrial size was moderately dilated.  4. The mitral valve is degenerative. Moderate mitral valve regurgitation.  5. Tricuspid valve regurgitation is moderate to severe.  6. The aortic valve is tricuspid. Aortic valve regurgitation is not visualized.  7. The inferior vena cava is dilated in size with <50% respiratory variability, suggesting right atrial pressure of 15 mmHg. Comparison(s): Sanders images unable to be directly viewed. FINDINGS  Left Ventricle: Left ventricular ejection fraction, by estimation, is <20%. The left ventricle has severely decreased function. The left ventricle demonstrates regional wall motion abnormalities. The left ventricular internal cavity size was normal in size. There is no left ventricular hypertrophy. Left ventricular diastolic function could not be evaluated due to atrial fibrillation. Left ventricular diastolic parameters are indeterminate.  LV Wall Scoring: The entire anterior wall, entire septum, entire apex, and entire inferior wall are akinetic. The antero-lateral wall and mid inferolateral segment are hypokinetic. The basal inferolateral segment is normal. Right Ventricle: The right ventricular size is mildly enlarged. No increase in right ventricular wall thickness. Right ventricular systolic function is moderately reduced. There is mildly elevated pulmonary artery systolic pressure. The tricuspid regurgitant velocity is 2.49 m/s, and with an assumed right atrial pressure of 15 mmHg, the estimated right ventricular systolic pressure is  [id number] mmHg. Left Atrium: Left atrial size was moderately dilated. Right Atrium: Right atrial size was normal in size. Pericardium: Trivial pericardial effusion is present. The pericardial effusion is posterior to the left ventricle. Mitral Valve: The mitral valve is degenerative in appearance. Moderate mitral valve regurgitation. Tricuspid Valve: The tricuspid valve is  grossly normal. Tricuspid valve regurgitation is moderate to severe. Aortic Valve: The aortic valve is tricuspid. There is mild aortic valve annular calcification. Aortic valve regurgitation is not visualized. Pulmonic Valve: The pulmonic valve was grossly normal. Pulmonic valve regurgitation is trivial. Aorta: The aortic root is normal in size and structure. Venous: The inferior vena cava is dilated in size with less than 50% respiratory variability, suggesting right atrial pressure of 15 mmHg. IAS/Shunts: No atrial level shunt detected by color flow Doppler.  LEFT VENTRICLE PLAX 2D LVIDd:         5.00 cm LVIDs:         4.50 cm LV PW:         0.90 cm LV IVS:        0.60 cm LVOT diam:     1.60 cm LVOT Area:     2.01 cm  RIGHT VENTRICLE RV Basal diam:  2.40 cm RV S prime:     8.05 cm/s TAPSE (M-mode): 1.1 cm LEFT ATRIUM           Index        RIGHT ATRIUM           Index LA diam:      3.90 cm 2.10 cm/m   RA Area:     17.10 cm LA Vol (A4C): 86.9 ml 46.83 ml/m  RA Volume:   44.50 ml  23.98 ml/m   AORTA Sanders Root diam: 2.60 cm Sanders Asc diam:  2.80 cm TRICUSPID VALVE TR Peak grad:   24.8 mmHg TR Vmax:        249.00 cm/s  SHUNTS Systemic Diam: 1.60 cm Rozann Lesches MD Electronically signed by Rozann Lesches MD Signature Date/Time: 05/22/2022/1:17:48 PM    Final    US Abdomen Limited RUQ (LIVER/GB)  Result Date: 05/21/2022 CLINICAL DATA:  Abnormal liver functions. EXAM: ULTRASOUND ABDOMEN LIMITED RIGHT UPPER QUADRANT COMPARISON:  CT examination dated 09/30/2018 FINDINGS: Gallbladder: There is a 2 cm shadowing gallstone. Bladder wall thickening  measuring up to 6 mm. No sonographic Murphy sign noted by sonographer. Common bile duct: Diameter: 3.7 Liver: No focal lesion identified. Echogenic hepatic parenchyma concerning for hepatic steatosis. Portal vein is patent on color Doppler imaging with normal direction of blood flow towards the liver. Other: Right pleural effusion. IMPRESSION: 1. Cholelithiasis with gallbladder wall thickening. Negative sonographic Murphy sign. Findings are equivocal for acute cholecystitis. 2. Hepatic steatosis. 3. Small right pleural effusion. Electronically Signed   By: Keane Police D.O.   On: 05/21/2022 09:46    Assessment & Plan    1.  PAF:  continue amiodarone and lopressor   2.  Essential hypertension by history, on Cozaar as an outpatient (presently held).  Due to low BP and azotemia   3.  Severe hyponatremia at presentation, improved with management per primary team.126   4.  Transaminitis, LFTs rising.  Does have cholelithiasis by right upper quadrant ultrasound, also hepatic steatosis but no definitive evidence of cholecystitis.  She reports no abdominal pain. Likely passive hepatic congestion from CHF   5.  Suspected right greater trochanteric fracture after fall at home.  Conservative management at this time per primary team.  6.  Hypothyroidism, TSH 10.2.  She is on Synthroid.  7.  History of seizures.  8.  Acute Systolic CHF: EF < [id number] moderate RV dysfunction and moderate MR newly diagnosed GDMT limited by low BP  consider lasix 20 mg daily can use Tolvaptan for Sodium less than 125 continue beta  blocker No ARB/ARNI due to elevated Cr and low BP  DNR/Palliative care   Signed, Jenkins Rouge, MD  05/23/2022, 8:09 AM

## 2022-05-24 DIAGNOSIS — F79 Unspecified intellectual disabilities: Secondary | ICD-10-CM

## 2022-05-24 DIAGNOSIS — E039 Hypothyroidism, unspecified: Secondary | ICD-10-CM | POA: Diagnosis not present

## 2022-05-24 DIAGNOSIS — E871 Hypo-osmolality and hyponatremia: Secondary | ICD-10-CM | POA: Diagnosis not present

## 2022-05-24 DIAGNOSIS — I420 Dilated cardiomyopathy: Secondary | ICD-10-CM | POA: Diagnosis not present

## 2022-05-24 LAB — COMPREHENSIVE METABOLIC PANEL
ALT: 1338 U/L — ABNORMAL HIGH (ref 0–44)
AST: 3078 U/L — ABNORMAL HIGH (ref 15–41)
Albumin: 3.2 g/dL — ABNORMAL LOW (ref 3.5–5.0)
Alkaline Phosphatase: 419 U/L — ABNORMAL HIGH (ref 38–126)
Anion gap: 10 (ref 5–15)
BUN: 40 mg/dL — ABNORMAL HIGH (ref 8–23)
CO2: 18 mmol/L — ABNORMAL LOW (ref 22–32)
Calcium: 8.5 mg/dL — ABNORMAL LOW (ref 8.9–10.3)
Chloride: 95 mmol/L — ABNORMAL LOW (ref 98–111)
Creatinine, Ser: 1.8 mg/dL — ABNORMAL HIGH (ref 0.44–1.00)
GFR, Estimated: 30 mL/min — ABNORMAL LOW (ref >60)
Glucose, Bld: 100 mg/dL — ABNORMAL HIGH (ref 70–99)
Potassium: 5.2 mmol/L — ABNORMAL HIGH (ref 3.5–5.1)
Sodium: 123 mmol/L — ABNORMAL LOW (ref 135–145)
Total Bilirubin: 1.1 mg/dL (ref 0.3–1.2)
Total Protein: 6.1 g/dL — ABNORMAL LOW (ref 6.5–8.1)

## 2022-05-24 LAB — PROTIME-INR
INR: 2 — ABNORMAL HIGH (ref 0.8–1.2)
Prothrombin Time: 22.7 seconds — ABNORMAL HIGH (ref 11.4–15.2)

## 2022-05-24 MED ORDER — AMIODARONE HCL 200 MG PO TABS
200.0000 mg | ORAL_TABLET | Freq: Two times a day (BID) | ORAL | Status: DC
  Administered 2022-05-24: 200 mg via ORAL
  Filled 2022-05-24: qty 1

## 2022-05-24 MED ORDER — PROCHLORPERAZINE EDISYLATE 10 MG/2ML IJ SOLN
10.0000 mg | INTRAMUSCULAR | Status: DC | PRN
  Administered 2022-05-24: 10 mg via INTRAVENOUS
  Filled 2022-05-24: qty 2

## 2022-05-24 MED ORDER — METOPROLOL TARTRATE 25 MG PO TABS
12.5000 mg | ORAL_TABLET | Freq: Two times a day (BID) | ORAL | Status: DC
  Administered 2022-05-25: 12.5 mg via ORAL
  Filled 2022-05-24 (×4): qty 1

## 2022-05-24 MED ORDER — METOCLOPRAMIDE HCL 5 MG/ML IJ SOLN
10.0000 mg | Freq: Three times a day (TID) | INTRAMUSCULAR | Status: DC | PRN
  Administered 2022-05-24: 10 mg via INTRAVENOUS
  Filled 2022-05-24: qty 2

## 2022-05-24 MED ORDER — AMIODARONE HCL 200 MG PO TABS: 200.0000 mg | ORAL_TABLET | Freq: Every day | ORAL | Status: DC

## 2022-05-24 NOTE — Progress Notes (Addendum)
PROGRESS NOTE   Pam Sanders  [id number] DOB: Jun 20, 1950 DOA: 05/19/2022 PCP: [name], FNP   Chief Complaint  Patient presents with   Fall   Level of care: Stepdown  Brief Admission History:  72 y.o. female with past medical history relevant for history of seizures and intellectual disability in the setting of cerebral ventriculomegaly, GERD, hypothyroidism, schizophrenia and HTN presented to the ED with falls and weakness/lethargy and altered mentation.  Pt was found to have new atrial fibrillation with RVR.  Pt was treated for a severe hyponatremia with a sodium down to 114.  Cardiology consulted 2/14 for persistent afib RVR.  Pt noted to have rising LFTs that is being investigated.    Assessment and Plan:  Symptomatic Severe Hyponatremia--secondary to end stage heart failure  -awaiting residential hospice evaluation -Sodium fluctuating  -completed 3% saline solution -stop normal saline now -working on palliative care now -waiting for goals of care discussions and possibly for residential hospice placement    Newly discovered Cardiomyopathy -discussed with cardiology  -pt has biventricular heart failure with LVEF <20%  -discussed with cardiology, recommendation is to pursue palliative medicine -I discussed with patient's family and they are agreeable to palliative care at SNF, they are not able to care for patient at home; will ask [facility] for SNF placement with palliative care;  -consulted palliative care for goals of care discussions with family  -working on appropriate disposition, referral requested for residential hospice placement   Elevated LFTs / Transaminitis  -LFTs continues to rise; thought secondary to hepatic congestion from right heart failure -right upper quadrant ultrasound -->findings of hepatic steatosis, equivocal findings for acute cholecystitis; cholelithiasis and wall thickening   New onset A-fib with RVR----IV metoprolol alone was ineffective  at controlling HR -cardiology consulted, pt was treated with IV amiodarone infusion, now transitioned to oral  -EKG is consistent with A-fib with RVR -echo reveals severe cardiomyopathy EF<20% -Elevated TSH noted, normal Free T4 reassuring   Recurrent Falls/Ambulatory Dysfunction and weakness--- due to severe hyponatremia -CT head without acute findings but shows 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 ventriculomegaly) -CT C-spine without acute findings -now we are working on arranging residential hospice placement    Possible right greater trochanter fracture--- Hip and pelvic x-rays with possible nondisplaced fracture of the greater trochanter on the right -continue conservative management -comfort care   DNR  - reviewed ACP documents that patient has in EMR - if terminally ill patient did not want lift prolonged by lift sustaining procedures - pt is now terminally ill and I have spoken with family about hospice care and transition to full comfort care    Hypothyroidism----TSH is 10.2 -Free T4 at 1.07 -continue levothyroxine supplementation   Hyperkalemia -added lokelma x 3 doses -treated and resolved    CKD 3A with hyperkalemia---  -Creatinine trending down with hydration  -Potassium normalized after Lokelma - renally adjusted medications, avoid nephrotoxic agents / dehydration  / hypotension   Epilepsy/history of seizures-- history of seizures and intellectual disability in the setting of cerebral ventriculomegaly- -no recent seizures -Not on antiepileptic agents PTA -Monitor closely especially with severe hyponatremia and risk for seizures   UTI--- treated with ceftriaxone  Candidal Intertrigo - ordered nizoral creme topical treatment    DVT prophylaxis: SCDs Code Status: DNR Family Communication: discussion with sister/brother in law at bedside  Disposition: Status is: Inpatient Remains inpatient appropriate  because: intensity of illness   Consultants:  Cardiology  Procedures:  Antimicrobials:     Subjective: Pt had vomited up most of her supper    Objective: Vitals:   05/24/22 0800 05/24/22 0900 05/24/22 1100 05/24/22 1153  BP: 102/69 105/62 (!) 85/53   Pulse: (!) 46 (!) 43 (!) 41   Resp: (!) 28 14 (!) 22   Temp:    (!) 97.4 F (36.3 C)  TempSrc:    Axillary  SpO2: 97% 100% 100%   Weight:      Height:        Intake/Output Summary (Last 24 hours) at 05/24/2022 1436 Last data filed at 05/24/2022 1100 Gross per 24 hour  Intake 559.94 ml  Output --  Net 559.94 ml   Filed Weights   05/19/22 1400 05/21/22 0452 05/23/22 0500  Weight: 97.4 kg 100.7 kg 102.3 kg   Examination:  General exam: awake, alert, cooperative, NAD, Appears calm and comfortable  Respiratory system: bibasilar crackles. Shallow breathing. Cardiovascular system: irregularly irregular and tachycardic, normal S1 & S2 heard. No JVD, murmurs, rubs, gallops or clicks. 1+ pedal edema. Gastrointestinal system: Abdomen is nondistended, soft and nontender. No organomegaly or masses felt. Normal bowel sounds heard. Central nervous system: Alert and oriented. No focal neurological deficits. Extremities: 1+ edema BLEs. Symmetric 5 x 5 power. Skin: No rashes, lesions or ulcers. Psychiatry: Mood & affect appropriate.   Data Reviewed: I have personally reviewed following labs and imaging studies  CBC: Recent Labs  Lab 05/19/22 1030 05/21/22 0434 05/22/22 0452  WBC 10.0 10.0 9.9  NEUTROABS 7.7  --   --   HGB 11.4* 11.6* 12.1  HCT 32.8* 35.8* 36.4  MCV 95.9 101.4* 99.2  PLT 226 190 [id number]    Basic Metabolic Panel: Recent Labs  Lab 05/20/22 1517 05/21/22 0434 05/21/22 2221 05/22/22 0452 05/23/22 0445 05/24/22 0407  NA 128* 126* 127* 127* 126* 123*  K 2.9* 5.3* 4.6 4.9 5.3* 5.2*  CL 107 100 98 99 97* 95*  CO2 15* 18* 19* 19* 18* 18*  GLUCOSE 76 78 109* 125* 162* 100*  BUN 19 21 23 $ 25* 28* 40*   CREATININE 0.68 1.00 1.12* 1.12* 1.47* 1.80*  CALCIUM 5.9* 8.2* 8.3* 8.1* 8.5* 8.5*  MG  --   --  2.1 2.1  --   --   PHOS 2.7  --   --   --   --   --     CBG: Recent Labs  Lab 05/19/22 1032  GLUCAP 108*    Recent Results (from the past 240 hour(s))  MRSA Next Gen by PCR, Nasal     Status: None   Collection Time: 05/19/22  2:00 PM   Specimen: Nasal Mucosa; Nasal Swab  Result Value Ref Range Status   MRSA by PCR Next Gen NOT DETECTED NOT DETECTED Final    Comment: (NOTE) The GeneXpert MRSA Assay (FDA approved for NASAL specimens only), is one component of a comprehensive MRSA colonization surveillance program. It is not intended to diagnose MRSA infection nor to guide or monitor treatment for MRSA infections. Test performance is not FDA approved in patients less than 14 years old. Performed at [facility], [address] [place], [place] [address]   Resp panel by RT-PCR (RSV, Flu A&B, Covid) Anterior Nasal Swab     Status: None   Collection Time: 05/19/22  2:10 PM   Specimen: Anterior Nasal Swab  Result Value Ref Range Status   SARS Coronavirus 2 by RT PCR NEGATIVE NEGATIVE Final    Comment: (NOTE) SARS-CoV-2 target  nucleic acids are NOT DETECTED.  The SARS-CoV-2 RNA is generally detectable in upper respiratory specimens during the acute phase of infection. The lowest concentration of SARS-CoV-2 viral copies this assay can detect is 138 copies/mL. A negative result does not preclude SARS-Cov-2 infection and should not be used as the sole basis for treatment or other patient management decisions. A negative result may occur with  improper specimen collection/handling, submission of specimen other than nasopharyngeal swab, presence of viral mutation(s) within the areas targeted by this assay, and inadequate number of viral copies(<138 copies/mL). A negative result must be combined with clinical observations, patient history, and epidemiological information. The expected  result is Negative.  Fact Sheet for Patients:  EntrepreneurPulse.com.au  Fact Sheet for Healthcare Providers:  IncredibleEmployment.be  This test is no t yet approved or cleared by the [place] FDA and  has been authorized for detection and/or diagnosis of SARS-CoV-2 by FDA under an Emergency Use Authorization (EUA). This EUA will remain  in effect (meaning this test can be used) for the duration of the COVID-19 declaration under Section 564(b)(1) of the Act, 21 U.S.C.section 360bbb-3(b)(1), unless the authorization is terminated  or revoked sooner.       Influenza A by PCR NEGATIVE NEGATIVE Final   Influenza B by PCR NEGATIVE NEGATIVE Final    Comment: (NOTE) The Xpert Xpress SARS-CoV-2/FLU/RSV plus assay is intended as an aid in the diagnosis of influenza from Nasopharyngeal swab specimens and should not be used as a sole basis for treatment. Nasal washings and aspirates are unacceptable for Xpert Xpress SARS-CoV-2/FLU/RSV testing.  Fact Sheet for Patients: EntrepreneurPulse.com.au  Fact Sheet for Healthcare Providers: IncredibleEmployment.be  This test is not yet approved or cleared by the [place] FDA and has been authorized for detection and/or diagnosis of SARS-CoV-2 by FDA under an Emergency Use Authorization (EUA). This EUA will remain in effect (meaning this test can be used) for the duration of the COVID-19 declaration under Section 564(b)(1) of the Act, 21 U.S.C. section 360bbb-3(b)(1), unless the authorization is terminated or revoked.     Resp Syncytial Virus by PCR NEGATIVE NEGATIVE Final    Comment: (NOTE) Fact Sheet for Patients: EntrepreneurPulse.com.au  Fact Sheet for Healthcare Providers: IncredibleEmployment.be  This test is not yet approved or cleared by the [place] FDA and has been authorized for detection and/or diagnosis of  SARS-CoV-2 by FDA under an Emergency Use Authorization (EUA). This EUA will remain in effect (meaning this test can be used) for the duration of the COVID-19 declaration under Section 564(b)(1) of the Act, 21 U.S.C. section 360bbb-3(b)(1), unless the authorization is terminated or revoked.  Performed at [facility], [address], [place], [place] [address]   Urine Culture     Status: None   Collection Time: 05/19/22  4:35 PM   Specimen: Urine, Clean Catch  Result Value Ref Range Status   Specimen Description   Final    URINE, CLEAN CATCH Performed at [facility], [address] [place], [place] [address]    Special Requests   Final    NONE Performed at [facility], [address] [place], [place] [address]    Culture   Final    NO GROWTH Performed at [facility], [place] [address], [place], [place] [address]    Report Status 05/21/2022 FINAL  Final     Radiology Studies: US ABDOMEN LIMITED WITH LIVER DOPPLER  Result Date: 05/23/2022 CLINICAL DATA:  Elevated LFTs. EXAM: DUPLEX ULTRASOUND OF LIVER TECHNIQUE:  Color and duplex Doppler ultrasound was performed to evaluate the hepatic in-flow and out-flow vessels. COMPARISON:  Abdominal ultrasound 05/21/2022 FINDINGS: Liver: Limited evaluation of the liver parenchyma on this examination. Refer to the recent abdominal ultrasound. Main Portal Vein size: 0.9 cm Portal Vein Velocities Main Prox:  19 cm/sec Main Mid: 21 cm/sec Main Dist:  18 cm/sec Right: 19 cm/sec Left: 36 cm/sec Hepatic Vein Velocities Right:  84 cm/sec Middle:  75 cm/sec Left:  86 cm/sec IVC: Present and patent with normal respiratory phasicity. Hepatic Artery Velocity:  100 cm/sec Splenic Vein Velocity:  41 cm/sec Spleen: 8.0 cm x 7.0 cm x 2.1 cm with a total volume of 61 cm^3 (411 cm^3 is upper limit normal) Portal Vein Occlusion/Thrombus: No Splenic Vein Occlusion/Thrombus: No Ascites: None Varices: None Normal hepatopetal flow in the portal veins. Normal  hepatofugal flow in the hepatic veins. IMPRESSION: Portal venous system is patent with normal direction of flow. Electronically Signed   By: Markus Daft M.D.   On: 05/23/2022 11:18    Scheduled Meds:  amiodarone  200 mg Oral BID   calcium carbonate  1,250 mg Oral Q breakfast   Chlorhexidine Gluconate Cloth  6 each Topical Daily   cholecalciferol  1,000 Units Oral Daily   famotidine  20 mg Oral QHS   heparin  5,000 Units Subcutaneous Q8H   ketoconazole   Topical BID   levothyroxine  150 mcg Oral QAC breakfast   linaclotide  145 mcg Oral QAC breakfast   metoprolol tartrate  12.5 mg Oral TID   multivitamin with minerals  1 tablet Oral Daily   pantoprazole  40 mg Oral Daily   polyethylene glycol  17 g Oral BID   sodium chloride flush  3 mL Intravenous Q12H   sodium chloride flush  3 mL Intravenous Q12H   Continuous Infusions:  sodium chloride       LOS: 5 days   Time Spent: 44 mins  Hazim Treadway, MD How to contact the [facility] Attending or Consulting provider [place] or covering provider during after hours [place], for this patient?  Check the care team in [facility] and look for a) attending/consulting [facility] provider listed and b) the [facility] team listed Log into [email or web address] and use Welcome's universal password to access. If you do not have the password, please contact the hospital operator. Locate the [facility] provider you are looking for under [facility] and page to a number that you can be directly reached. If you still have difficulty reaching the provider, please page the [facility] (Director on Call) for the Hospitalists listed on amion for assistance.  05/24/2022, 2:36 PM

## 2022-05-24 NOTE — TOC Progression Note (Signed)
Per chart review and progression, patient is potential for residential hospice. Family is awaiting palliative consult for goals of care conversation. Will need TOC consult to initiate residential hospice referral.  Dani Gobble to advise palliative has not seen patient yet, however MD requesting potential for residential hospice. This RNCM spoke with Manuela Schwartz who will need the following faxed to [phone]: demo, H&P, progress notes, palliative note, prognosis and SSN to review for potential residential hospice. This RNCM will await palliative note and TOC consult.    TOC following for needs.

## 2022-05-25 DIAGNOSIS — K219 Gastro-esophageal reflux disease without esophagitis: Secondary | ICD-10-CM | POA: Diagnosis not present

## 2022-05-25 DIAGNOSIS — E871 Hypo-osmolality and hyponatremia: Secondary | ICD-10-CM | POA: Diagnosis not present

## 2022-05-25 DIAGNOSIS — F259 Schizoaffective disorder, unspecified: Secondary | ICD-10-CM | POA: Diagnosis not present

## 2022-05-25 DIAGNOSIS — I10 Essential (primary) hypertension: Secondary | ICD-10-CM | POA: Diagnosis not present

## 2022-05-25 MED ORDER — MILK AND MOLASSES ENEMA
1.0000 | Freq: Once | RECTAL | Status: AC
  Administered 2022-05-25: 240 mL via RECTAL

## 2022-05-25 MED ORDER — LORAZEPAM 0.5 MG PO TABS: 1.0000 mg | ORAL_TABLET | ORAL | Status: DC | PRN

## 2022-05-25 NOTE — Progress Notes (Signed)
PROGRESS NOTE   Pam Sanders  [id number] DOB: [birth year] DOA: 05/19/2022 PCP: [name], FNP   Chief Complaint  Patient presents with   Fall   Level of care: Stepdown  Brief Admission History:  72 y.o. female with past medical history relevant for history of seizures and intellectual disability in the setting of cerebral ventriculomegaly, GERD, hypothyroidism, schizophrenia and HTN presented to the ED with falls and weakness/lethargy and altered mentation.  Pt was found to have new atrial fibrillation with RVR.  Pt was treated for a severe hyponatremia with a sodium down to 114.  Cardiology consulted 2/14 for persistent afib RVR.  Pt noted to have rising LFTs that is being investigated.    Assessment and Plan:  Symptomatic Severe Hyponatremia--secondary to end stage heart failure  -awaiting residential hospice evaluation -Sodium fluctuating  -completed 3% saline solution -stopped normal saline  -waiting on palliative care now -waiting for residential hospice placement  -add IV lorazepam in case she starts seizing from low sodium    Newly discovered Cardiomyopathy -discussed with cardiology  -pt has biventricular heart failure with LVEF <20%  -discussed with cardiology, recommendation is to pursue palliative medicine -I discussed with patient's family and they are agreeable to palliative care at SNF, they are not able to care for patient at home; will ask [facility] for SNF placement with palliative care;  -consulted palliative care for goals of care discussions with family  -working on appropriate disposition, referral requested for residential hospice placement  -waiting for residential hospice evaluation  -anticipating less than 2 weeks prognosis as heart and liver continue to fail   Elevated LFTs / Transaminitis  -LFTs continues to rise; thought secondary to hepatic congestion from right heart failure -right upper quadrant ultrasound -->findings of hepatic steatosis,  equivocal findings for acute cholecystitis; cholelithiasis and wall thickening   New onset A-fib with RVR----IV metoprolol alone was ineffective at controlling HR -cardiology consulted, pt was treated with IV amiodarone infusion, now transitioned to oral  -EKG is consistent with A-fib with RVR -echo reveals severe cardiomyopathy EF<20% -Elevated TSH noted, normal Free T4 reassuring -DC oral amiodarone due to advancing liver failure and progressive malaise and loss of appetite    Recurrent Falls/Ambulatory Dysfunction and weakness--- due to severe hyponatremia -CT head without acute findings but shows 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 ventriculomegaly) -CT C-spine without acute findings -now we are working on arranging residential hospice placement    Possible right greater trochanter fracture--- Hip and pelvic x-rays with possible nondisplaced fracture of the greater trochanter on the right -continue conservative management -comfort care   Obstipation - GI started linzess - RN disempacted at bedside on 2/17 - had moderate BM last night - ordered for milk of molasses enema today  - continue laxatives as tolerated   DNR  - reviewed ACP documents that patient has in EMR - if terminally ill patient did not want lift prolonged by lift sustaining procedures - pt is now terminally ill and I have spoken with family about hospice care and transition to full comfort care    Hypothyroidism----TSH is 10.2 -Free T4 at 1.07 -continue levothyroxine supplementation   Hyperkalemia -added lokelma x 3 doses -treated and resolved    CKD 3A with hyperkalemia---  -Creatinine trending down with hydration  -Potassium normalized after Lokelma - renally adjusted medications, avoid nephrotoxic agents / dehydration  / hypotension   Epilepsy/history of seizures-- history of seizures and intellectual disability in the setting  of cerebral  ventriculomegaly- -no recent seizures -Not on antiepileptic agents PTA -Monitor closely especially with severe hyponatremia and risk for seizures   UTI--- treated with ceftriaxone  Candidal Intertrigo - ordered nizoral creme topical treatment    DVT prophylaxis: SCDs Code Status: DNR Family Communication: discussion with sister/brother in law at bedside  Disposition: Status is: Inpatient Remains inpatient appropriate because: intensity of illness   Consultants:  Cardiology  Procedures:   Antimicrobials:     Subjective: Pt continues to have nausea and poor appetite, vomited last night    Objective: Vitals:   05/25/22 0500 05/25/22 0600 05/25/22 0753 05/25/22 0800  BP:  (!) 93/45  (!) 108/52  Pulse: 61 (!) 58  (!) 59  Resp: 16 16  (!) 25  Temp:   97.7 F (36.5 C)   TempSrc:   Axillary   SpO2: 100% 100%  99%  Weight:      Height:        Intake/Output Summary (Last 24 hours) at 05/25/2022 1104 Last data filed at 05/25/2022 0900 Gross per 24 hour  Intake 120 ml  Output 400 ml  Net -280 ml   Filed Weights   05/21/22 0452 05/23/22 0500 05/25/22 0438  Weight: 100.7 kg 102.3 kg 99.2 kg   Examination:  General exam: awake, alert, cooperative, NAD, Appears calm and comfortable  Respiratory system: bibasilar crackles. Shallow breathing. Cardiovascular system: irregularly irregular and tachycardic, normal S1 & S2 heard. No JVD, murmurs, rubs, gallops or clicks. 1+ pedal edema. Gastrointestinal system: Abdomen is nondistended, soft and nontender. No organomegaly or masses felt. Normal bowel sounds heard. Central nervous system: Alert and oriented. No focal neurological deficits. Extremities: 1+ edema BLEs. Symmetric 5 x 5 power. Skin: No rashes, lesions or ulcers. Psychiatry: Mood & affect flat.    Data Reviewed: I have personally reviewed following labs and imaging studies  CBC: Recent Labs  Lab 05/19/22 1030 05/21/22 0434 05/22/22 0452  WBC 10.0 10.0 9.9   NEUTROABS 7.7  --   --   HGB 11.4* 11.6* 12.1  HCT 32.8* 35.8* 36.4  MCV 95.9 101.4* 99.2  PLT 226 190 [id number]    Basic Metabolic Panel: Recent Labs  Lab 05/20/22 1517 05/21/22 0434 05/21/22 2221 05/22/22 0452 05/23/22 0445 05/24/22 0407  NA 128* 126* 127* 127* 126* 123*  K 2.9* 5.3* 4.6 4.9 5.3* 5.2*  CL 107 100 98 99 97* 95*  CO2 15* 18* 19* 19* 18* 18*  GLUCOSE 76 78 109* 125* 162* 100*  BUN 19 21 23 $ 25* 28* 40*  CREATININE 0.68 1.00 1.12* 1.12* 1.47* 1.80*  CALCIUM 5.9* 8.2* 8.3* 8.1* 8.5* 8.5*  MG  --   --  2.1 2.1  --   --   PHOS 2.7  --   --   --   --   --     CBG: Recent Labs  Lab 05/19/22 1032  GLUCAP 108*    Recent Results (from the past 240 hour(s))  MRSA Next Gen by PCR, Nasal     Status: None   Collection Time: 05/19/22  2:00 PM   Specimen: Nasal Mucosa; Nasal Swab  Result Value Ref Range Status   MRSA by PCR Next Gen NOT DETECTED NOT DETECTED Final    Comment: (NOTE) The GeneXpert MRSA Assay (FDA approved for NASAL specimens only), is one component of a comprehensive MRSA colonization surveillance program. It is not intended to diagnose MRSA infection nor to guide or monitor treatment for MRSA infections. Test performance  is not FDA approved in patients less than 65 years old. Performed at [facility], [address], [place], [place] [address]   Resp panel by RT-PCR (RSV, Flu A&B, Covid) Anterior Nasal Swab     Status: None   Collection Time: 05/19/22  2:10 PM   Specimen: Anterior Nasal Swab  Result Value Ref Range Status   SARS Coronavirus 2 by RT PCR NEGATIVE NEGATIVE Final    Comment: (NOTE) SARS-CoV-2 target nucleic acids are NOT DETECTED.  The SARS-CoV-2 RNA is generally detectable in upper respiratory specimens during the acute phase of infection. The lowest concentration of SARS-CoV-2 viral copies this assay can detect is 138 copies/mL. A negative result does not preclude SARS-Cov-2 infection and should not be used as the sole basis for  treatment or other patient management decisions. A negative result may occur with  improper specimen collection/handling, submission of specimen other than nasopharyngeal swab, presence of viral mutation(s) within the areas targeted by this assay, and inadequate number of viral copies(<138 copies/mL). A negative result must be combined with clinical observations, patient history, and epidemiological information. The expected result is Negative.  Fact Sheet for Patients:  EntrepreneurPulse.com.au  Fact Sheet for Healthcare Providers:  IncredibleEmployment.be  This test is no t yet approved or cleared by the [place] FDA and  has been authorized for detection and/or diagnosis of SARS-CoV-2 by FDA under an Emergency Use Authorization (EUA). This EUA will remain  in effect (meaning this test can be used) for the duration of the COVID-19 declaration under Section 564(b)(1) of the Act, 21 U.S.C.section 360bbb-3(b)(1), unless the authorization is terminated  or revoked sooner.       Influenza A by PCR NEGATIVE NEGATIVE Final   Influenza B by PCR NEGATIVE NEGATIVE Final    Comment: (NOTE) The Xpert Xpress SARS-CoV-2/FLU/RSV plus assay is intended as an aid in the diagnosis of influenza from Nasopharyngeal swab specimens and should not be used as a sole basis for treatment. Nasal washings and aspirates are unacceptable for Xpert Xpress SARS-CoV-2/FLU/RSV testing.  Fact Sheet for Patients: EntrepreneurPulse.com.au  Fact Sheet for Healthcare Providers: IncredibleEmployment.be  This test is not yet approved or cleared by the [place] FDA and has been authorized for detection and/or diagnosis of SARS-CoV-2 by FDA under an Emergency Use Authorization (EUA). This EUA will remain in effect (meaning this test can be used) for the duration of the COVID-19 declaration under Section 564(b)(1) of the Act, 21  U.S.C. section 360bbb-3(b)(1), unless the authorization is terminated or revoked.     Resp Syncytial Virus by PCR NEGATIVE NEGATIVE Final    Comment: (NOTE) Fact Sheet for Patients: EntrepreneurPulse.com.au  Fact Sheet for Healthcare Providers: IncredibleEmployment.be  This test is not yet approved or cleared by the [place] FDA and has been authorized for detection and/or diagnosis of SARS-CoV-2 by FDA under an Emergency Use Authorization (EUA). This EUA will remain in effect (meaning this test can be used) for the duration of the COVID-19 declaration under Section 564(b)(1) of the Act, 21 U.S.C. section 360bbb-3(b)(1), unless the authorization is terminated or revoked.  Performed at [facility], [address] [place], [place] [address]   Urine Culture     Status: None   Collection Time: 05/19/22  4:35 PM   Specimen: Urine, Clean Catch  Result Value Ref Range Status   Specimen Description   Final    URINE, CLEAN CATCH Performed at [facility], [address] [place], [place] [address]    Special Requests  Final    NONE Performed at [facility], [address], [place], [place] [address]    Culture   Final    NO GROWTH Performed at [facility], [place] [address], [place], [place] [address]    Report Status 05/21/2022 FINAL  Final     Radiology Studies: No results found.  Scheduled Meds:  calcium carbonate  1,250 mg Oral Q breakfast   Chlorhexidine Gluconate Cloth  6 each Topical Daily   cholecalciferol  1,000 Units Oral Daily   famotidine  20 mg Oral QHS   heparin  5,000 Units Subcutaneous Q8H   ketoconazole   Topical BID   levothyroxine  150 mcg Oral QAC breakfast   linaclotide  145 mcg Oral QAC breakfast   metoprolol tartrate  12.5 mg Oral BID   multivitamin with minerals  1 tablet Oral Daily   pantoprazole  40 mg Oral Daily   sodium chloride flush  3 mL Intravenous Q12H   sodium chloride flush  3 mL  Intravenous Q12H   Continuous Infusions:  sodium chloride       LOS: 6 days   Time Spent: 38 mins  Jentri Aye, MD How to contact the [facility] Attending or Consulting provider [place] or covering provider during after hours [place], for this patient?  Check the care team in [facility] and look for a) attending/consulting [facility] provider listed and b) the [facility] team listed Log into [email or web address] and use [place]'s universal password to access. If you do not have the password, please contact the hospital operator. Locate the [facility] provider you are looking for under [facility] and page to a number that you can be directly reached. If you still have difficulty reaching the provider, please page the [facility] (Director on Call) for the Hospitalists listed on amion for assistance.  05/25/2022, 11:04 AM

## 2022-05-25 NOTE — Progress Notes (Signed)
Patient had several episodes of vomiting yesterday 05/24/22. 1700 tech and I decided to check for impaction and removed several hard formed ball of stool. This was after patient had been given dulcolax suppository. Shift report was that patient had moderate bowel movement last night. Patient states she does not feel like eating breakfast this morning.

## 2022-05-26 ENCOUNTER — Ambulatory Visit: Payer: 59

## 2022-05-26 ENCOUNTER — Encounter (HOSPITAL_COMMUNITY): Payer: Self-pay | Admitting: Family Medicine

## 2022-05-26 DIAGNOSIS — G9389 Other specified disorders of brain: Secondary | ICD-10-CM | POA: Diagnosis not present

## 2022-05-26 DIAGNOSIS — K761 Chronic passive congestion of liver: Secondary | ICD-10-CM

## 2022-05-26 DIAGNOSIS — Z7189 Other specified counseling: Secondary | ICD-10-CM

## 2022-05-26 DIAGNOSIS — Z515 Encounter for palliative care: Secondary | ICD-10-CM

## 2022-05-26 DIAGNOSIS — N1832 Chronic kidney disease, stage 3b: Secondary | ICD-10-CM

## 2022-05-26 DIAGNOSIS — I429 Cardiomyopathy, unspecified: Secondary | ICD-10-CM

## 2022-05-26 DIAGNOSIS — I5189 Other ill-defined heart diseases: Secondary | ICD-10-CM

## 2022-05-26 DIAGNOSIS — I420 Dilated cardiomyopathy: Secondary | ICD-10-CM | POA: Diagnosis not present

## 2022-05-26 DIAGNOSIS — K219 Gastro-esophageal reflux disease without esophagitis: Secondary | ICD-10-CM | POA: Diagnosis not present

## 2022-05-26 DIAGNOSIS — E871 Hypo-osmolality and hyponatremia: Secondary | ICD-10-CM | POA: Diagnosis not present

## 2022-05-26 DIAGNOSIS — I130 Hypertensive heart and chronic kidney disease with heart failure and stage 1 through stage 4 chronic kidney disease, or unspecified chronic kidney disease: Secondary | ICD-10-CM

## 2022-05-26 DIAGNOSIS — I340 Nonrheumatic mitral (valve) insufficiency: Secondary | ICD-10-CM

## 2022-05-26 DIAGNOSIS — I480 Paroxysmal atrial fibrillation: Secondary | ICD-10-CM | POA: Diagnosis not present

## 2022-05-26 DIAGNOSIS — F259 Schizoaffective disorder, unspecified: Secondary | ICD-10-CM | POA: Diagnosis not present

## 2022-05-26 MED ORDER — ACETAMINOPHEN 650 MG RE SUPP: 650.0000 mg | Freq: Four times a day (QID) | RECTAL | Status: DC | PRN

## 2022-05-26 MED ORDER — ACETAMINOPHEN 325 MG PO TABS: 650.0000 mg | ORAL_TABLET | Freq: Four times a day (QID) | ORAL | Status: DC | PRN

## 2022-05-26 MED ORDER — POLYVINYL ALCOHOL 1.4 % OP SOLN: 1.0000 [drp] | Freq: Four times a day (QID) | OPHTHALMIC | Status: DC | PRN

## 2022-05-26 MED ORDER — MORPHINE SULFATE (PF) 2 MG/ML IV SOLN
1.0000 mg | INTRAVENOUS | Status: DC | PRN
  Administered 2022-05-26: 1 mg via INTRAVENOUS

## 2022-05-26 MED ORDER — GLYCOPYRROLATE 0.2 MG/ML IJ SOLN: 0.2000 mg | INTRAMUSCULAR | Status: DC | PRN

## 2022-05-26 MED ORDER — BIOTENE DRY MOUTH MT LIQD: 15.0000 mL | OROMUCOSAL | Status: DC | PRN

## 2022-05-26 MED ORDER — LORAZEPAM 1 MG PO TABS: 1.0000 mg | ORAL_TABLET | ORAL | Status: DC | PRN

## 2022-05-26 MED ORDER — GLYCOPYRROLATE 1 MG PO TABS: 1.0000 mg | ORAL_TABLET | ORAL | Status: DC | PRN

## 2022-05-26 NOTE — Progress Notes (Addendum)
Rounding Note    Patient Name: Pam Sanders Date of Encounter: 05/26/2022  [facility] Cardiologist: None    Subjective   No complaints except for itching throughout her body but not able to say the location.  Inpatient Medications    Scheduled Meds:  calcium carbonate  1,250 mg Oral Q breakfast   Chlorhexidine Gluconate Cloth  6 each Topical Daily   cholecalciferol  1,000 Units Oral Daily   famotidine  20 mg Oral QHS   heparin  5,000 Units Subcutaneous Q8H   ketoconazole   Topical BID   levothyroxine  150 mcg Oral QAC breakfast   linaclotide  145 mcg Oral QAC breakfast   metoprolol tartrate  12.5 mg Oral BID   multivitamin with minerals  1 tablet Oral Daily   pantoprazole  40 mg Oral Daily   sodium chloride flush  3 mL Intravenous Q12H   sodium chloride flush  3 mL Intravenous Q12H   Continuous Infusions:  sodium chloride     PRN Meds: sodium chloride, acetaminophen **OR** acetaminophen, bisacodyl, LORazepam, metoCLOPramide (REGLAN) injection, metoprolol tartrate, morphine injection, mouth rinse, polyethylene glycol, prochlorperazine, sodium chloride flush   Vital Signs    Vitals:   05/25/22 1700 05/25/22 2000 05/25/22 2109 05/26/22 0438  BP:  117/65 114/71 106/74  Pulse:   (!) 57 (!) 59  Resp:  13 15 16  $ Temp: 97.6 F (36.4 C)  98 F (36.7 C) 97.7 F (36.5 C)  TempSrc: Axillary  Axillary Axillary  SpO2:   100% 96%  Weight:   99.9 kg   Height:        Intake/Output Summary (Last 24 hours) at 05/26/2022 0926 Last data filed at 05/25/2022 1802 Gross per 24 hour  Intake 0 ml  Output --  Net 0 ml      05/25/2022    9:09 PM 05/25/2022    4:38 AM 05/23/2022    5:00 AM  Last 3 Weights  Weight (lbs) 220 lb 3.8 oz 218 lb 11.1 oz 225 lb 8.5 oz  Weight (kg) 99.9 kg 99.2 kg 102.3 kg      Telemetry    Not on tele   ECG    Not performed today  Physical Exam    GEN: Obese, answering questions, No acute distress.   Neck: Unable to examine  due to body habitus Cardiac: RRR, no murmurs, rubs, or gallops.  Respiratory: Clear to auscultation bilaterally. GI: Soft, nontender, non-distended  MS: Edema is present; No deformity. Neuro:  Nonfocal  Psych: Normal affect   Labs    High Sensitivity Troponin:   Recent Labs  Lab 05/21/22 2221  TROPONINIHS 14     Chemistry Recent Labs  Lab 05/21/22 2221 05/22/22 0452 05/23/22 0445 05/24/22 0407  NA 127* 127* 126* 123*  K 4.6 4.9 5.3* 5.2*  CL 98 99 97* 95*  CO2 19* 19* 18* 18*  GLUCOSE 109* 125* 162* 100*  BUN 23 25* 28* 40*  CREATININE 1.12* 1.12* 1.47* 1.80*  CALCIUM 8.3* 8.1* 8.5* 8.5*  MG 2.1 2.1  --   --   PROT  --  6.2* 6.2* 6.1*  ALBUMIN  --  3.1* 3.1* 3.2*  AST  --  600* 1,134* 3,078*  ALT  --  346* 622* 1,338*  ALKPHOS  --  187* 369* 419*  BILITOT  --  0.8 0.9 1.1  GFRNONAA 53* 53* 38* 30*  ANIONGAP 10 9 11 10    $ Lipids  Recent Labs  Lab 05/22/22 0452  CHOL 91  TRIG 25  HDL 45  LDLCALC 41  CHOLHDL 2.0    Hematology Recent Labs  Lab 05/19/22 1030 05/21/22 0434 05/22/22 0452  WBC 10.0 10.0 9.9  RBC 3.42* 3.53* 3.67*  HGB 11.4* 11.6* 12.1  HCT 32.8* 35.8* 36.4  MCV 95.9 101.4* 99.2  MCH 33.3 32.9 33.0  MCHC 34.8 32.4 33.2  RDW 14.3 15.6* 16.1*  PLT 226 190 230   Thyroid  Recent Labs  Lab 05/19/22 1030  TSH 10.288*  FREET4 1.07    BNPNo results for input(s): "BNP", "PROBNP" in the last 168 hours.  DDimer No results for input(s): "DDIMER" in the last 168 hours.   Radiology    No results found.  Cardiac Studies   Echo 05/22/22  IMPRESSIONS     1. Left ventricular ejection fraction, by estimation, is <20%. The left  ventricle has severely decreased function. The left ventricle demonstrates  regional wall motion abnormalities (see scoring diagram/findings for  description). Left ventricular  diastolic parameters are indeterminate.   2. Right ventricular systolic function is moderately reduced. The right  ventricular size is  mildly enlarged. There is mildly elevated pulmonary  artery systolic pressure. The estimated right ventricular systolic  pressure is [id number] mmHg.   3. Left atrial size was moderately dilated.   4. The mitral valve is degenerative. Moderate mitral valve regurgitation.   5. Tricuspid valve regurgitation is moderate to severe.   6. The aortic valve is tricuspid. Aortic valve regurgitation is not  visualized.   7. The inferior vena cava is dilated in size with <50% respiratory  variability, suggesting right atrial pressure of 15 mmHg.   Comparison(s): Prior images unable to be directly viewed.     Assessment & Plan    # Biventricular failure with LVEF less than 20% and moderate RV dysfunction with moderate MR # Acute liver failure likely from hepatic congestion, worsening LFTs (3K AST and 1K ALT on 05/23/22) # Cardiorenal syndrome # Hypervolemic hyponatremia, severe -Palliative care on board, on home hospice -GDMT discontinued  # Paroxysmal atrial fibrillation, not on telemetry -Amiodarone discontinued, currently on home hospice   # HTN -Medication discontinued due to lower blood pressures   # Hypothyroidism, TSH 10.2.  She is on Synthroid.   # History of seizures/intellectual disability in setting of cerebral ventriculomegaly     [facility] will sign off.   Medication Recommendations:   none Other recommendations (labs, testing, etc):  none Follow up as an outpatient:  none- hospice   For questions or updates, please contact [place] Please consult [email or web address] for contact info under    Natosha Bou Fidel Levy, MD [place]  10:42 AM

## 2022-05-26 NOTE — Discharge Summary (Signed)
Physician Discharge Summary  Pam Sanders [id number] DOB: 02/26/1951 DOA: 05/19/2022  PCP: [name], FNP  Admit date: 05/19/2022 Discharge date: 05/26/2022  Admitted From:  Home  Disposition:  Hospice   Recommendations for Outpatient Follow-up:  SYMPTOM MANAGEMENT PER HOSPICE PROTOCOL    Discharge Condition: STABLE   CODE STATUS: DNR  DIET: COMFORT    Brief Hospitalization Summary: Please see all hospital notes, images, labs for full details of the hospitalization. 72 y.o. female with past medical history relevant for history of seizures and intellectual disability in the setting of cerebral ventriculomegaly, GERD, hypothyroidism, schizophrenia and HTN presented to the ED with falls and weakness/lethargy and altered mentation.  Pt was found to have new atrial fibrillation with RVR.  Pt was treated for a severe hyponatremia with a sodium down to 114.  Cardiology consulted 2/14 for persistent afib RVR.  Pt noted to have rising LFTs that is being investigated.    Assessment and Plan:   Symptomatic Severe Hyponatremia--secondary to end stage heart failure  -awaiting residential hospice evaluation -Sodium fluctuating  -completed 3% saline solution -stopped normal saline  -waiting on palliative care now -waiting for residential hospice placement  -add IV lorazepam in case she starts seizing from low sodium    Newly discovered Cardiomyopathy -discussed with cardiology  -pt has biventricular heart failure with LVEF <20%  -discussed with cardiology, recommendation is to pursue palliative medicine -I discussed with patient's family and they are agreeable to palliative care at SNF, they are not able to care for patient at home; will ask [facility] for SNF placement with palliative care;  -consulted palliative care for goals of care discussions with family  -working on appropriate disposition, referral requested for residential hospice placement  -waiting for residential hospice  evaluation  -anticipating less than 2 weeks prognosis as heart and liver continue to fail    Elevated LFTs / Transaminitis  -LFTs continues to rise; thought secondary to hepatic congestion from right heart failure -right upper quadrant ultrasound -->findings of hepatic steatosis, equivocal findings for acute cholecystitis; cholelithiasis and wall thickening -increasing symptoms and scleral icterus seen -transitioned to full comfort measures    New onset A-fib with RVR----IV metoprolol alone was ineffective at controlling HR -cardiology consulted, pt was treated with IV amiodarone infusion, now transitioned to oral  -EKG is consistent with A-fib with RVR -echo reveals severe cardiomyopathy EF<20% -Elevated TSH noted, normal Free T4 reassuring -DC oral amiodarone due to advancing liver failure and progressive malaise and loss of appetite    Recurrent Falls/Ambulatory Dysfunction and weakness--- due to severe hyponatremia -CT head without acute findings but shows 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 ventriculomegaly) -CT C-spine without acute findings -now we are working on arranging residential hospice placement    Possible right greater trochanter fracture--- Hip and pelvic x-rays with possible nondisplaced fracture of the greater trochanter on the right -continue conservative management -comfort care    Obstipation - GI started linzess - RN disempacted at bedside on 2/17 - had moderate BM last night - ordered for milk of molasses enema today  - transitioned to full comfort care treatments    DNR  - reviewed ACP documents that patient has in EMR - if terminally ill patient did not want lift prolonged by lift sustaining procedures - pt is now terminally ill and I have spoken with family about hospice care and transition to full comfort care    Hypothyroidism----TSH is 10.2 -Free T4 at 1.07  Hyperkalemia -added lokelma  x 3 doses -treated and resolved     CKD 3A with hyperkalemia---  -Creatinine trending down with hydration  -Potassium normalized after Lokelma - renally adjusted medications, avoid nephrotoxic agents / dehydration  / hypotension - transitioned to full comfort measures    Epilepsy/history of seizures-- history of seizures and intellectual disability in the setting of cerebral ventriculomegaly- -no recent seizures -Not on antiepileptic agents PTA -Monitor closely especially with severe hyponatremia and risk for seizures -transitioned to full comfort measures 2/19   UTI--- treated with ceftriaxone   Candidal Intertrigo - treated with nizoral creme topical treatment    DVT prophylaxis: SCDs--transitioned to full comfort measures 2/19 Code Status: DNR Family Communication: discussion with sister/brother in law at bedside 2/19  Disposition: anticipating residential hospice  Remains inpatient appropriate because: intensity of illness   Discharge Diagnoses:  Principal Problem:   Hyponatremia Active Problems:   Seizure disorder ([place])   Primary hypertension   Cerebral ventriculomegaly/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 ventriculomegaly)   CKD (chronic kidney disease) stage 3, GFR 30-59 ml/min (HCC)   Epileptic grand mal status ([place])   Gastroesophageal reflux disease without esophagitis   Hypothyroidism   Intellectual disability   Schizo-affective schizophrenia, chronic condition ([place])   Cardiomyopathy ([place])   Right ventricular dysfunction, NYHA class 3   Nonrheumatic mitral valve regurgitation   Hepatic congestion   Cardiorenal syndrome, stage 1-4 or unspecified chronic kidney disease, with heart failure (HCC)   Paroxysmal atrial fibrillation ([place])   Discharge Instructions:  Allergies as of 05/26/2022   No Known Allergies      Medication List     STOP taking these medications    atorvastatin 10 MG  tablet Commonly known as: LIPITOR   calcium carbonate 1500 (600 Ca) MG Tabs tablet Commonly known as: OSCAL   cetirizine 10 MG tablet Commonly known as: ZYRTEC   Clotrimazole 1 % Oint   famotidine 20 MG tablet Commonly known as: PEPCID   Fish Oil 1000 MG Caps   fluticasone 50 MCG/ACT nasal spray Commonly known as: FLONASE   furosemide 20 MG tablet Commonly known as: LASIX   lamoTRIgine 100 MG tablet Commonly known as: LAMICTAL   levothyroxine 150 MCG tablet Commonly known as: SYNTHROID   Linzess 72 MCG capsule Generic drug: linaclotide   loratadine 10 MG tablet Commonly known as: CLARITIN   losartan 25 MG tablet Commonly known as: COZAAR   Melatonin 5 MG Caps   multivitamin with minerals Tabs tablet   Olopatadine HCl 0.2 % Soln   omeprazole 20 MG capsule Commonly known as: PRILOSEC   potassium chloride SA 20 MEQ tablet Commonly known as: KLOR-CON M   sertraline 50 MG tablet Commonly known as: ZOLOFT   spironolactone 25 MG tablet Commonly known as: ALDACTONE   thioridazine 50 MG tablet Commonly known as: MELLARIL   Vitamin D-3 25 MCG (1000 UT) Caps        No Known Allergies Allergies as of 05/26/2022   No Known Allergies      Medication List     STOP taking these medications    atorvastatin 10 MG tablet Commonly known as: LIPITOR   calcium carbonate 1500 (600 Ca) MG Tabs tablet Commonly known as: OSCAL   cetirizine 10 MG tablet Commonly known as: ZYRTEC   Clotrimazole 1 % Oint   famotidine 20 MG tablet Commonly known as: PEPCID   Fish Oil 1000 MG Caps   fluticasone 50 MCG/ACT nasal  spray Commonly known as: FLONASE   furosemide 20 MG tablet Commonly known as: LASIX   lamoTRIgine 100 MG tablet Commonly known as: LAMICTAL   levothyroxine 150 MCG tablet Commonly known as: SYNTHROID   Linzess 72 MCG capsule Generic drug: linaclotide   loratadine 10 MG tablet Commonly known as: CLARITIN   losartan 25 MG  tablet Commonly known as: COZAAR   Melatonin 5 MG Caps   multivitamin with minerals Tabs tablet   Olopatadine HCl 0.2 % Soln   omeprazole 20 MG capsule Commonly known as: PRILOSEC   potassium chloride SA 20 MEQ tablet Commonly known as: KLOR-CON M   sertraline 50 MG tablet Commonly known as: ZOLOFT   spironolactone 25 MG tablet Commonly known as: ALDACTONE   thioridazine 50 MG tablet Commonly known as: MELLARIL   Vitamin D-3 25 MCG (1000 UT) Caps        Procedures/Studies: US ABDOMEN LIMITED WITH LIVER DOPPLER  Result Date: 05/23/2022 CLINICAL DATA:  Elevated LFTs. EXAM: DUPLEX ULTRASOUND OF LIVER TECHNIQUE: Color and duplex Doppler ultrasound was performed to evaluate the hepatic in-flow and out-flow vessels. COMPARISON:  Abdominal ultrasound 05/21/2022 FINDINGS: Liver: Limited evaluation of the liver parenchyma on this examination. Refer to the recent abdominal ultrasound. Main Portal Vein size: 0.9 cm Portal Vein Velocities Main Prox:  19 cm/sec Main Mid: 21 cm/sec Main Dist:  18 cm/sec Right: 19 cm/sec Left: 36 cm/sec Hepatic Vein Velocities Right:  84 cm/sec Middle:  75 cm/sec Left:  86 cm/sec IVC: Present and patent with normal respiratory phasicity. Hepatic Artery Velocity:  100 cm/sec Splenic Vein Velocity:  41 cm/sec Spleen: 8.0 cm x 7.0 cm x 2.1 cm with a total volume of 61 cm^3 (411 cm^3 is upper limit normal) Portal Vein Occlusion/Thrombus: No Splenic Vein Occlusion/Thrombus: No Ascites: None Varices: None Normal hepatopetal flow in the portal veins. Normal hepatofugal flow in the hepatic veins. IMPRESSION: Portal venous system is patent with normal direction of flow. Electronically Signed   By: Markus Daft M.D.   On: 05/23/2022 11:18   ECHOCARDIOGRAM COMPLETE  Result Date: 05/22/2022    ECHOCARDIOGRAM REPORT   Patient Name:   DAJAHNAE HOLLIGAN Date of Exam: 05/22/2022 Medical Rec #:  [id number]        Height:       56.0 in Accession #:    [id number]       Weight:        222.0 lb Date of Birth:  03-31-1951        BSA:          1.856 m Patient Age:    72 years         BP:           112/78 mmHg Patient Gender: F                HR:           119 bpm. Exam Location:  [facility] Procedure: 2D Echo, Cardiac Doppler and Color Doppler Indications:    atrial fibrillation  History:        Patient has prior history of Echocardiogram examinations, most                 recent 04/06/2016. Chronic kidney disease; Risk                 Factors:Hypertension.  Sonographer:    Johny Chess RDCS Referring Phys: [place]  1. Left ventricular ejection fraction, by estimation,  is <20%. The left ventricle has severely decreased function. The left ventricle demonstrates regional wall motion abnormalities (see scoring diagram/findings for description). Left ventricular diastolic parameters are indeterminate.  2. Right ventricular systolic function is moderately reduced. The right ventricular size is mildly enlarged. There is mildly elevated pulmonary artery systolic pressure. The estimated right ventricular systolic pressure is [id number] mmHg.  3. Left atrial size was moderately dilated.  4. The mitral valve is degenerative. Moderate mitral valve regurgitation.  5. Tricuspid valve regurgitation is moderate to severe.  6. The aortic valve is tricuspid. Aortic valve regurgitation is not visualized.  7. The inferior vena cava is dilated in size with <50% respiratory variability, suggesting right atrial pressure of 15 mmHg. Comparison(s): Prior images unable to be directly viewed. FINDINGS  Left Ventricle: Left ventricular ejection fraction, by estimation, is <20%. The left ventricle has severely decreased function. The left ventricle demonstrates regional wall motion abnormalities. The left ventricular internal cavity size was normal in size. There is no left ventricular hypertrophy. Left ventricular diastolic function could not be evaluated due to atrial fibrillation. Left ventricular  diastolic parameters are indeterminate.  LV Wall Scoring: The entire anterior wall, entire septum, entire apex, and entire inferior wall are akinetic. The antero-lateral wall and mid inferolateral segment are hypokinetic. The basal inferolateral segment is normal. Right Ventricle: The right ventricular size is mildly enlarged. No increase in right ventricular wall thickness. Right ventricular systolic function is moderately reduced. There is mildly elevated pulmonary artery systolic pressure. The tricuspid regurgitant velocity is 2.49 m/s, and with an assumed right atrial pressure of 15 mmHg, the estimated right ventricular systolic pressure is [id number] mmHg. Left Atrium: Left atrial size was moderately dilated. Right Atrium: Right atrial size was normal in size. Pericardium: Trivial pericardial effusion is present. The pericardial effusion is posterior to the left ventricle. Mitral Valve: The mitral valve is degenerative in appearance. Moderate mitral valve regurgitation. Tricuspid Valve: The tricuspid valve is grossly normal. Tricuspid valve regurgitation is moderate to severe. Aortic Valve: The aortic valve is tricuspid. There is mild aortic valve annular calcification. Aortic valve regurgitation is not visualized. Pulmonic Valve: The pulmonic valve was grossly normal. Pulmonic valve regurgitation is trivial. Aorta: The aortic root is normal in size and structure. Venous: The inferior vena cava is dilated in size with less than 50% respiratory variability, suggesting right atrial pressure of 15 mmHg. IAS/Shunts: No atrial level shunt detected by color flow Doppler.  LEFT VENTRICLE PLAX 2D LVIDd:         5.00 cm LVIDs:         4.50 cm LV PW:         0.90 cm LV IVS:        0.60 cm LVOT diam:     1.60 cm LVOT Area:     2.01 cm  RIGHT VENTRICLE RV Basal diam:  2.40 cm RV S prime:     8.05 cm/s TAPSE (M-mode): 1.1 cm LEFT ATRIUM           Index        RIGHT ATRIUM           Index LA diam:      3.90 cm 2.10 cm/m   RA  Area:     17.10 cm LA Vol (A4C): 86.9 ml 46.83 ml/m  RA Volume:   44.50 ml  23.98 ml/m   AORTA Ao Root diam: 2.60 cm Ao Asc diam:  2.80 cm TRICUSPID VALVE TR Peak grad:   24.8  mmHg TR Vmax:        249.00 cm/s  SHUNTS Systemic Diam: 1.60 cm Rozann Lesches MD Electronically signed by Rozann Lesches MD Signature Date/Time: 05/22/2022/1:17:48 PM    Final    US Abdomen Limited RUQ (LIVER/GB)  Result Date: 05/21/2022 CLINICAL DATA:  Abnormal liver functions. EXAM: ULTRASOUND ABDOMEN LIMITED RIGHT UPPER QUADRANT COMPARISON:  CT examination dated 09/30/2018 FINDINGS: Gallbladder: There is a 2 cm shadowing gallstone. Bladder wall thickening measuring up to 6 mm. No sonographic Murphy sign noted by sonographer. Common bile duct: Diameter: 3.7 Liver: No focal lesion identified. Echogenic hepatic parenchyma concerning for hepatic steatosis. Portal vein is patent on color Doppler imaging with normal direction of blood flow towards the liver. Other: Right pleural effusion. IMPRESSION: 1. Cholelithiasis with gallbladder wall thickening. Negative sonographic Murphy sign. Findings are equivocal for acute cholecystitis. 2. Hepatic steatosis. 3. Small right pleural effusion. Electronically Signed   By: Keane Police D.O.   On: 05/21/2022 09:46   DG Chest 2 View  Result Date: 05/19/2022 CLINICAL DATA:  ams, fall with bruise to R upper back EXAM: CHEST - 2 VIEW COMPARISON:  Radiograph 04/03/2022 FINDINGS: Borderline enlarged cardiac silhouette. Low low lung volumes. Trace pleural effusions. No definite airspace consolidation. Bilateral shoulder degenerative changes. Thoracic spondylosis. No evidence of acute fracture. IMPRESSION: Borderline enlarged cardiac silhouette. Low lung volumes. Trace pleural effusions. No definite airspace consolidation. No evidence of acute osseous abnormality. Electronically Signed   By: Maurine Simmering M.D.   On: 05/19/2022 11:42   DG HIP UNILAT WITH PELVIS 2-3 VIEWS RIGHT  Result Date:  05/19/2022 CLINICAL DATA:  Bruising, fall EXAM: DG HIP (WITH OR WITHOUT PELVIS) 2-3V RIGHT COMPARISON:  Radiograph 08/29/2012 FINDINGS: There is cortical irregularity of the greater trochanter. No evidence of femoral neck fracture. There is mild osteoarthritis of the hips. IMPRESSION: Cortical irregularity of the greater trochanter, could represent a nondisplaced fracture. Consider CT. Electronically Signed   By: Maurine Simmering M.D.   On: 05/19/2022 11:28   CT CERVICAL SPINE WO CONTRAST  Result Date: 05/19/2022 CLINICAL DATA:  72 year old female status post fall in bathtub over the weekend. EXAM: CT CERVICAL SPINE WITHOUT CONTRAST TECHNIQUE: Multidetector CT imaging of the cervical spine was performed without intravenous contrast. Multiplanar CT image reconstructions were also generated. RADIATION DOSE REDUCTION: This exam was performed according to the departmental dose-optimization program which includes automated exposure control, adjustment of the mA and/or kV according to patient size and/or use of iterative reconstruction technique. COMPARISON:  Head CT today.  Cervical spine CT 04/28/2016. FINDINGS: Alignment: Mildly improved cervical lordosis compared to 2018. Chronic degenerative appearing mild anterolisthesis of C4 on C5 has not significantly changed. Cervicothoracic junction alignment is within normal limits. Bilateral posterior element alignment is within normal limits. Skull base and vertebrae: Visualized skull base is intact. No atlanto-occipital dissociation. C1 and C2 appear intact and aligned. No acute osseous abnormality identified. Soft tissues and spinal canal: No prevertebral fluid or swelling. No visible canal hematoma. Mild motion artifact. Disc levels: Chronic C2 through C3 posterior element and/or interbody ankylosis. Associated chronic C4-C5 spondylolisthesis and facet arthropathy which is greater on the right. And chronic disc and endplate degeneration [id number] and C6-C7. However, fairly  capacious spinal canal. Doubt spinal stenosis. Upper chest: T1 inferior endplate deformity most resembling a Schmorl's node is present in 2018 but is larger. Evidence of a layering right pleural effusion. Otherwise negative lung apices. IMPRESSION: 1. No acute traumatic injury identified in the cervical spine. 2. Chronic cervical spine  ankylosis and degeneration but no CT evidence of spinal stenosis. 3. Layering right pleural effusion visible in the upper chest. Electronically Signed   By: Genevie Ann M.D.   On: 05/19/2022 11:09   CT HEAD WO CONTRAST  Result Date: 05/19/2022 CLINICAL DATA:  72 year old female status post fall in bathtub over the weekend. EXAM: CT HEAD WITHOUT CONTRAST TECHNIQUE: Contiguous axial images were obtained from the base of the skull through the vertex without intravenous contrast. RADIATION DOSE REDUCTION: This exam was performed according to the departmental dose-optimization program which includes automated exposure control, adjustment of the mA and/or kV according to patient size and/or use of iterative reconstruction technique. COMPARISON:  Head CT 11/01/2019. FINDINGS: Brain: Chronic colpocephaly, and asymmetric atrophy or encephalomalacia in the left occipital lobe. This is associated with chronic coarse calcified dural thickening and posterior midline shift toward the smaller hemisphere. Chronic cerebellar volume loss also which is more pronounced on the right. No evidence of transependymal edema. Basilar cisterns remain patent. No superimposed No midline shift, ventriculomegaly, mass effect, evidence of mass lesion, intracranial hemorrhage or evidence of cortically based acute infarction. Vascular: Calcified atherosclerosis at the skull base. No suspicious intracranial vascular hyperdensity. Skull: No acute osseous abnormality identified. Chronic hyperostosis. Sinuses/Orbits: Visualized paranasal sinuses and mastoids are clear. Other: No acute orbit or scalp soft tissue injury  identified. IMPRESSION: 1. No acute intracranial abnormality or acute traumatic injury identified. 2. Chronic and likely 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, colpocephaly. Coarse dural calcification and hyperostosis of calvarium. Electronically Signed   By: Genevie Ann M.D.   On: 05/19/2022 11:06     Subjective: Pt says she hurts everywhere   Discharge Exam: Vitals:   05/26/22 0438 05/26/22 0938  BP: 106/74 114/71  Pulse: (!) 59 62  Resp: 16 16  Temp: 97.7 F (36.5 C) 98.1 F (36.7 C)  SpO2: 96% 100%   Vitals:   05/25/22 2000 05/25/22 2109 05/26/22 0438 05/26/22 0938  BP: 117/65 114/71 106/74 114/71  Pulse:  (!) 57 (!) 59 62  Resp: 13 15 16 16  $ Temp:  98 F (36.7 C) 97.7 F (36.5 C) 98.1 F (36.7 C)  TempSrc:  Axillary Axillary Oral  SpO2:  100% 96% 100%  Weight:  99.9 kg    Height:       General exam: awake, alert, cooperative, NAD, appears pale with scleral icterus seen.  Respiratory system: bibasilar crackles. Shallow breathing. Cardiovascular system: irregularly irregular and tachycardic, normal S1 & S2 heard. No JVD, murmurs, rubs, gallops or clicks. 1+ pedal edema. Gastrointestinal system: Abdomen is distended, soft and diffusely tender. No organomegaly or masses felt. Normal bowel sounds heard. Central nervous system: Alert and oriented. No focal neurological deficits. Extremities: 1+ edema BLEs. Symmetric 5 x 5 power. Skin: No rashes, lesions or ulcers. Psychiatry: Mood & affect flat.     The results of significant diagnostics from this hospitalization (including imaging, microbiology, ancillary and laboratory) are listed below for reference.     Microbiology: Recent Results (from the past 240 hour(s))  MRSA Next Gen by PCR, Nasal     Status: None   Collection Time: 05/19/22  2:00 PM   Specimen: Nasal Mucosa; Nasal Swab  Result Value Ref Range Status   MRSA by PCR Next Gen NOT DETECTED NOT DETECTED Final    Comment: (NOTE) The GeneXpert MRSA  Assay (FDA approved for NASAL specimens only), is one component of a comprehensive MRSA colonization surveillance program. It is not intended to diagnose  MRSA infection nor to guide or monitor treatment for MRSA infections. Test performance is not FDA approved in patients less than 109 years old. Performed at [facility], [address], [place], [place] [address]   Resp panel by RT-PCR (RSV, Flu A&B, Covid) Anterior Nasal Swab     Status: None   Collection Time: 05/19/22  2:10 PM   Specimen: Anterior Nasal Swab  Result Value Ref Range Status   SARS Coronavirus 2 by RT PCR NEGATIVE NEGATIVE Final    Comment: (NOTE) SARS-CoV-2 target nucleic acids are NOT DETECTED.  The SARS-CoV-2 RNA is generally detectable in upper respiratory specimens during the acute phase of infection. The lowest concentration of SARS-CoV-2 viral copies this assay can detect is 138 copies/mL. A negative result does not preclude SARS-Cov-2 infection and should not be used as the sole basis for treatment or other patient management decisions. A negative result may occur with  improper specimen collection/handling, submission of specimen other than nasopharyngeal swab, presence of viral mutation(s) within the areas targeted by this assay, and inadequate number of viral copies(<138 copies/mL). A negative result must be combined with clinical observations, patient history, and epidemiological information. The expected result is Negative.  Fact Sheet for Patients:  EntrepreneurPulse.com.au  Fact Sheet for Healthcare Providers:  IncredibleEmployment.be  This test is no t yet approved or cleared by the [place] FDA and  has been authorized for detection and/or diagnosis of SARS-CoV-2 by FDA under an Emergency Use Authorization (EUA). This EUA will remain  in effect (meaning this test can be used) for the duration of the COVID-19 declaration under Section 564(b)(1) of the Act,  21 U.S.C.section 360bbb-3(b)(1), unless the authorization is terminated  or revoked sooner.       Influenza A by PCR NEGATIVE NEGATIVE Final   Influenza B by PCR NEGATIVE NEGATIVE Final    Comment: (NOTE) The Xpert Xpress SARS-CoV-2/FLU/RSV plus assay is intended as an aid in the diagnosis of influenza from Nasopharyngeal swab specimens and should not be used as a sole basis for treatment. Nasal washings and aspirates are unacceptable for Xpert Xpress SARS-CoV-2/FLU/RSV testing.  Fact Sheet for Patients: EntrepreneurPulse.com.au  Fact Sheet for Healthcare Providers: IncredibleEmployment.be  This test is not yet approved or cleared by the [place] FDA and has been authorized for detection and/or diagnosis of SARS-CoV-2 by FDA under an Emergency Use Authorization (EUA). This EUA will remain in effect (meaning this test can be used) for the duration of the COVID-19 declaration under Section 564(b)(1) of the Act, 21 U.S.C. section 360bbb-3(b)(1), unless the authorization is terminated or revoked.     Resp Syncytial Virus by PCR NEGATIVE NEGATIVE Final    Comment: (NOTE) Fact Sheet for Patients: EntrepreneurPulse.com.au  Fact Sheet for Healthcare Providers: IncredibleEmployment.be  This test is not yet approved or cleared by the [place] FDA and has been authorized for detection and/or diagnosis of SARS-CoV-2 by FDA under an Emergency Use Authorization (EUA). This EUA will remain in effect (meaning this test can be used) for the duration of the COVID-19 declaration under Section 564(b)(1) of the Act, 21 U.S.C. section 360bbb-3(b)(1), unless the authorization is terminated or revoked.  Performed at [facility], [address], [place], [place] [address]   Urine Culture     Status: None   Collection Time: 05/19/22  4:35 PM   Specimen: Urine, Clean Catch  Result Value Ref Range Status    Specimen Description   Final    URINE, CLEAN CATCH Performed at [facility]  [facility] [address], [place], [place] [address]    Special Requests   Final    NONE Performed at [facility], [address], [place], [place] [address]    Culture   Final    NO GROWTH Performed at [facility], [place] [address], [place], [place] [address]    Report Status 05/21/2022 FINAL  Final     Labs: BNP (last 3 results) No results for input(s): "BNP" in the last 8760 hours. Basic Metabolic Panel: Recent Labs  Lab 05/20/22 1517 05/21/22 0434 05/21/22 2221 05/22/22 0452 05/23/22 0445 05/24/22 0407  NA 128* 126* 127* 127* 126* 123*  K 2.9* 5.3* 4.6 4.9 5.3* 5.2*  CL 107 100 98 99 97* 95*  CO2 15* 18* 19* 19* 18* 18*  GLUCOSE 76 78 109* 125* 162* 100*  BUN 19 21 23 $ 25* 28* 40*  CREATININE 0.68 1.00 1.12* 1.12* 1.47* 1.80*  CALCIUM 5.9* 8.2* 8.3* 8.1* 8.5* 8.5*  MG  --   --  2.1 2.1  --   --   PHOS 2.7  --   --   --   --   --    Liver Function Tests: Recent Labs  Lab 05/20/22 0559 05/20/22 1517 05/21/22 0434 05/22/22 0452 05/23/22 0445 05/24/22 0407  AST 150*  --  261* 600* 1,134* 3,078*  ALT 108*  --  168* 346* 622* 1,338*  ALKPHOS 96  --  109 187* 369* 419*  BILITOT 0.7  --  0.8 0.8 0.9 1.1  PROT 5.8*  --  6.0* 6.2* 6.2* 6.1*  ALBUMIN 3.0* 2.1* 3.1* 3.1* 3.1* 3.2*   No results for input(s): "LIPASE", "AMYLASE" in the last 168 hours. No results for input(s): "AMMONIA" in the last 168 hours. CBC: Recent Labs  Lab 05/21/22 0434 05/22/22 0452  WBC 10.0 9.9  HGB 11.6* 12.1  HCT 35.8* 36.4  MCV 101.4* 99.2  PLT 190 230   Cardiac Enzymes: No results for input(s): "CKTOTAL", "CKMB", "CKMBINDEX", "TROPONINI" in the last 168 hours. BNP: Invalid input(s): "POCBNP" CBG: No results for input(s): "GLUCAP" in the last 168 hours. D-Dimer No results for input(s): "DDIMER" in the last 72 hours. Hgb A1c No results for input(s): "HGBA1C" in the last 72 hours. Lipid Profile No  results for input(s): "CHOL", "HDL", "LDLCALC", "TRIG", "CHOLHDL", "LDLDIRECT" in the last 72 hours. Thyroid function studies No results for input(s): "TSH", "T4TOTAL", "T3FREE", "THYROIDAB" in the last 72 hours.  Invalid input(s): "FREET3" Anemia work up No results for input(s): "VITAMINB12", "FOLATE", "FERRITIN", "TIBC", "IRON", "RETICCTPCT" in the last 72 hours. Urinalysis    Component Value Date/Time   COLORURINE YELLOW 05/19/2022 1635   APPEARANCEUR HAZY (A) 05/19/2022 1635   APPEARANCEUR Clear 12/05/2020 1124   LABSPEC 1.009 05/19/2022 1635   PHURINE 6.0 05/19/2022 1635   GLUCOSEU NEGATIVE 05/19/2022 1635   HGBUR SMALL (A) 05/19/2022 1635   BILIRUBINUR NEGATIVE 05/19/2022 1635   BILIRUBINUR Negative 12/05/2020 1124   KETONESUR NEGATIVE 05/19/2022 1635   PROTEINUR 30 (A) 05/19/2022 1635   NITRITE NEGATIVE 05/19/2022 1635   LEUKOCYTESUR LARGE (A) 05/19/2022 1635   Sepsis Labs Recent Labs  Lab 05/21/22 0434 05/22/22 0452  WBC 10.0 9.9   Microbiology Recent Results (from the past 240 hour(s))  MRSA Next Gen by PCR, Nasal     Status: None   Collection Time: 05/19/22  2:00 PM   Specimen: Nasal Mucosa; Nasal Swab  Result Value Ref Range Status   MRSA by PCR Next Gen NOT  DETECTED NOT DETECTED Final    Comment: (NOTE) The GeneXpert MRSA Assay (FDA approved for NASAL specimens only), is one component of a comprehensive MRSA colonization surveillance program. It is not intended to diagnose MRSA infection nor to guide or monitor treatment for MRSA infections. Test performance is not FDA approved in patients less than 74 years old. Performed at [facility], [address] [place], [place] [address]   Resp panel by RT-PCR (RSV, Flu A&B, Covid) Anterior Nasal Swab     Status: None   Collection Time: 05/19/22  2:10 PM   Specimen: Anterior Nasal Swab  Result Value Ref Range Status   SARS Coronavirus 2 by RT PCR NEGATIVE NEGATIVE Final    Comment: (NOTE) SARS-CoV-2 target  nucleic acids are NOT DETECTED.  The SARS-CoV-2 RNA is generally detectable in upper respiratory specimens during the acute phase of infection. The lowest concentration of SARS-CoV-2 viral copies this assay can detect is 138 copies/mL. A negative result does not preclude SARS-Cov-2 infection and should not be used as the sole basis for treatment or other patient management decisions. A negative result may occur with  improper specimen collection/handling, submission of specimen other than nasopharyngeal swab, presence of viral mutation(s) within the areas targeted by this assay, and inadequate number of viral copies(<138 copies/mL). A negative result must be combined with clinical observations, patient history, and epidemiological information. The expected result is Negative.  Fact Sheet for Patients:  EntrepreneurPulse.com.au  Fact Sheet for Healthcare Providers:  IncredibleEmployment.be  This test is no t yet approved or cleared by the [place] FDA and  has been authorized for detection and/or diagnosis of SARS-CoV-2 by FDA under an Emergency Use Authorization (EUA). This EUA will remain  in effect (meaning this test can be used) for the duration of the COVID-19 declaration under Section 564(b)(1) of the Act, 21 U.S.C.section 360bbb-3(b)(1), unless the authorization is terminated  or revoked sooner.       Influenza A by PCR NEGATIVE NEGATIVE Final   Influenza B by PCR NEGATIVE NEGATIVE Final    Comment: (NOTE) The Xpert Xpress SARS-CoV-2/FLU/RSV plus assay is intended as an aid in the diagnosis of influenza from Nasopharyngeal swab specimens and should not be used as a sole basis for treatment. Nasal washings and aspirates are unacceptable for Xpert Xpress SARS-CoV-2/FLU/RSV testing.  Fact Sheet for Patients: EntrepreneurPulse.com.au  Fact Sheet for Healthcare  Providers: IncredibleEmployment.be  This test is not yet approved or cleared by the [place] FDA and has been authorized for detection and/or diagnosis of SARS-CoV-2 by FDA under an Emergency Use Authorization (EUA). This EUA will remain in effect (meaning this test can be used) for the duration of the COVID-19 declaration under Section 564(b)(1) of the Act, 21 U.S.C. section 360bbb-3(b)(1), unless the authorization is terminated or revoked.     Resp Syncytial Virus by PCR NEGATIVE NEGATIVE Final    Comment: (NOTE) Fact Sheet for Patients: EntrepreneurPulse.com.au  Fact Sheet for Healthcare Providers: IncredibleEmployment.be  This test is not yet approved or cleared by the [place] FDA and has been authorized for detection and/or diagnosis of SARS-CoV-2 by FDA under an Emergency Use Authorization (EUA). This EUA will remain in effect (meaning this test can be used) for the duration of the COVID-19 declaration under Section 564(b)(1) of the Act, 21 U.S.C. section 360bbb-3(b)(1), unless the authorization is terminated or revoked.  Performed at [facility], [address] [place],  [address]   Urine Culture     Status: None  Collection Time: 05/19/22  4:35 PM   Specimen: Urine, Clean Catch  Result Value Ref Range Status   Specimen Description   Final    URINE, CLEAN CATCH Performed at [facility], [address] [place], [place] [address]    Special Requests   Final    NONE Performed at [facility], [address], [place], [place] [address]    Culture   Final    NO GROWTH Performed at [facility], [place] [address] [place], [place] [address]    Report Status 05/21/2022 FINAL  Final   Time coordinating discharge: 35 mins  SIGNED:  Irwin Brakeman, MD  [facility] 05/26/2022, 2:27 PM How to contact the [facility] Attending or Consulting provider [place] or covering provider during after  hours [place], for this patient?  Check the care team in [facility] and look for a) attending/consulting TRH provider listed and b) the [facility] team listed Log into [email or web address] and use [place]'s universal password to access. If you do not have the password, please contact the hospital operator. Locate the [facility] provider you are looking for under [facility] and page to a number that you can be directly reached. If you still have difficulty reaching the provider, please page the [facility] (Director on Call) for the Hospitalists listed on amion for assistance.

## 2022-05-26 NOTE — Consult Note (Signed)
Consultation Note Date: 05/26/2022   Patient Name: Pam Sanders  DOB: Jul 19, 1950  MRN: [id number]  Age / Sex: 72 y.o., female  PCP: Pam Perking, FNP Referring Physician: Murlean Iba, MD  Reason for Consultation: Establishing goals of care  HPI/Patient Profile: 72 y.o. female  with past medical history of seizure, intellectual disability in the setting of cerebral ventriculomegaly, schizophrenia, GERD, hypothyroid, HTN, morbid obesity admitted on 05/19/2022 with symptomatic severe hyponatremia secondary to end-stage heart failure, new discovered cardiomyopathy with a EF of less than 20%, markedly elevated LFTs, recurrent falls and ambulatory dysfunction, obstipation.   Clinical Assessment and Goals of Care: I have reviewed medical records including EPIC notes, labs and imaging, received report from RN, assessed the patient.  Pam Sanders is lying quietly in bed.  She will briefly make but not keep eye contact.  She appears acutely/chronically ill, obese.  She is able to tell me her name, where we are, and the month.  She is telling me that she is having pain, "all over".  I am not sure but I believe that she can make her needs known.  There is no family at bedside at this time. Face-to-face discussion with hospitalist and transition of care team.  Call to sister, Pam Sanders, to discuss diagnosis prognosis, [place], EOL wishes, disposition and options.  I introduced Palliative Medicine as specialized medical care for people living with serious illness. It focuses on providing relief from the symptoms and stress of a serious illness. The goal is to improve quality of life for both the patient and the family.  FAMILY REQUESTS THAT WE DO NOT DISCUSS HOSPICE WITH PATIENT D/T HER INTELLECTUAL DISABILITY, "SHE WOULD NOT UNDERSTAND.  I share that hospice care is not usually hidden from people.  We discussed a  brief life review of the patient.  Pam Sanders was 1 of 13 siblings.  She was born with intellectual disability, but graduated high school.  Her sister shares that they put her in classes that she enjoyed, such as art, to help her get through high school.  She worked for about 5 years after graduation, but has not worked since.  She has never married, has no children.  Pam Sanders and Pam Sanders are the only siblings still living.  She has been living in [facility] with [facility] aid 9 AM to 4 PM.  We then focused on their current illness.  We talk about Mrs. Sanders cardiac history, Pam Sanders shares that they have a strong family history of heart issues.  We also talk about her obstipation and the treatment plan.  We also talk about the markedly increased liver function.  The natural disease trajectory and expectations at EOL were discussed.  The difference between aggressive medical intervention and comfort care was considered in light of the patient's goals of care.  We talk about the benefits of comfort and dignity, let nature take its course, residential hospice.  Pam Sanders is agreeable to comfort care measures here in the hospital to start now.  Advanced directives, concepts specific to code status, artifical feeding and hydration, and rehospitalization were considered and discussed. DNR.  [place] form completed and placed on chart.  Hospice and Palliative Care services outpatient were explained and offered.  We talked about the difference between palliative and hospice care.  Pam Sanders shares that she would like hospice care for [name].  She is requesting hospice of [facility] for residential hospice.  Discussed the importance of continued conversation with family and the medical providers regarding overall plan of care and treatment options, ensuring decisions are within the context of the patient's values and GOCs.  Questions and concerns were addressed.  The family was encouraged to call  with questions or concerns.  PMT will continue to support holistically.    HCPOA NEXT OF KIN -sister, Pam Sanders.  [name] was never married and had no children.  She has no other living siblings.    SUMMARY OF RECOMMENDATIONS   Requesting comfort and dignity at end-of-life, residential hospice at [place] order set placed   [place]: DNR -goldenrod form completed and placed on chart.  Symptom Management:  End-of-life order set in place  Palliative Prophylaxis:  Frequent Pain Assessment, Oral Care, and Turn Reposition  Additional Recommendations (Limitations, Scope, Preferences): Full Comfort Care  Psycho-social/Spiritual:  Desire for further Chaplaincy support:no Additional Recommendations: Caregiving  Support/Resources and Education on Hospice  Prognosis:  < 2 weeks anticipated based on acuity of condition including, but not limited to, liver dysfunction, severe cardiomyopathy and weakness, obstipation with minimal by mouth intake, family's desire to focus on comfort and dignity, let nature take its course.Marland Kitchen  Discharge Planning: Requesting comfort and dignity at end-of-life, residential hospice at [place]      Primary Diagnoses: Present on Admission:  Hyponatremia  CKD (chronic kidney disease) stage 3, GFR 30-59 ml/min (HCC)  Gastroesophageal reflux disease without esophagitis  Schizo-affective schizophrenia, chronic condition ([place])  Primary hypertension  Hypothyroidism  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 The following aspects are pertinent.  Past Medical History:  Diagnosis Date   Gait disturbance    GERD (gastroesophageal reflux disease)    HA (headache)    Hernia of abdominal wall    History of hernia surgery    Hypertension    Hypothyroidism    Intellectual disability    Lower extremity edema     Osteopenia 09/29/2020   Schizophrenia (HCC)    Seizures (HCC)    Tremor    Social History   Socioeconomic History   Marital status: Single    Spouse name: Not on file   Number of children: 0   Years of education: HS   Highest education level: 12th grade  Occupational History   Occupation: Disabled  Tobacco Use   Smoking status: Never   Smokeless tobacco: Never  Vaping Use   Vaping Use: Never used  Substance and Sexual Activity   Alcohol use: No   Drug use: No   Sexual activity: Not Currently    Birth control/protection: Post-menopausal  Other Topics Concern   Not on file  Social History Narrative   Patient lives at home alone. Patient has a high school education. Her sister comes in frequently to take care of medications, groceries. She now has an Aid that that comes in M-F for 16 hours a week that helps her shower and do oral care.   Caffeine - one  soda daily.   Social Determinants of Health   Financial Resource Strain: Low Risk  (03/04/2021)   Overall Financial Resource Strain (CARDIA)    Difficulty of Paying Living Expenses: Not hard at all  Food Insecurity: No Food Insecurity (03/04/2021)   Hunger Vital Sign    Worried About Running Out of Food in the Last Year: Never true    Ran Out of Food in the Last Year: Never true  Transportation Needs: No Transportation Needs (03/04/2021)   PRAPARE - Hydrologist (Medical): No    Lack of Transportation (Non-Medical): No  Physical Activity: Inactive (03/04/2021)   Exercise Vital Sign    Days of Exercise per Week: 0 days    Minutes of Exercise per Session: 0 min  Stress: No Stress Concern Present (03/04/2021)   [place]    Feeling of Stress : Not at all  Social Connections: Socially Isolated (03/04/2021)   Social Connection and Isolation Panel [NHANES]    Frequency of Communication with Friends and Family: More than three times  a week    Frequency of Social Gatherings with Friends and Family: More than three times a week    Attends Religious Services: Never    Marine scientist or Organizations: No    Attends Music therapist: Never    Marital Status: Never married   Family History  Problem Relation Age of Onset   Stroke Mother    Stroke Father    Stroke Other    Diabetes Other    Heart Problems Other    Thyroid cancer Sister    Hypertension Sister    Emphysema Brother    Other Brother        died at birth   Aneurysm Brother        stomach   Throat cancer Brother    Emphysema Brother    Heart disease Brother    Stroke Brother    Other Brother        MVA   Other Sister        died at birth   Aneurysm Sister        head   Scheduled Meds:  calcium carbonate  1,250 mg Oral Q breakfast   Chlorhexidine Gluconate Cloth  6 each Topical Daily   cholecalciferol  1,000 Units Oral Daily   famotidine  20 mg Oral QHS   heparin  5,000 Units Subcutaneous Q8H   ketoconazole   Topical BID   levothyroxine  150 mcg Oral QAC breakfast   linaclotide  145 mcg Oral QAC breakfast   metoprolol tartrate  12.5 mg Oral BID   multivitamin with minerals  1 tablet Oral Daily   pantoprazole  40 mg Oral Daily   sodium chloride flush  3 mL Intravenous Q12H   sodium chloride flush  3 mL Intravenous Q12H   Continuous Infusions:  sodium chloride     PRN Meds:.sodium chloride, acetaminophen **OR** acetaminophen, bisacodyl, LORazepam, metoCLOPramide (REGLAN) injection, metoprolol tartrate, morphine injection, mouth rinse, polyethylene glycol, prochlorperazine, sodium chloride flush Medications Prior to Admission:  Prior to Admission medications   Medication Sig Start Date End Date Taking? Authorizing Provider  atorvastatin (LIPITOR) 10 MG tablet TAKE ONE TABLET ONCE DAILY 12/18/21  Yes Hendricks Limes F, FNP  calcium carbonate (OSCAL) 1500 (600 Ca) MG TABS tablet Take 1,200 mg by mouth daily.   Yes [provider]  cetirizine (ZYRTEC) 10 MG  tablet TAKE 1 TABLET DAILY FOR ALLERGY SYMPTOMS 12/27/21  Yes Hendricks Limes F, FNP  Cholecalciferol (VITAMIN D-3) 1000 UNITS CAPS Take 1,000 Units by mouth daily.    Yes [provider]  famotidine (PEPCID) 20 MG tablet Take 1 tablet (20 mg total) by mouth at bedtime. (NEEDS TO BE SEEN BEFORE NEXT REFILL) 05/06/22  Yes Pam Perking, FNP  fluticasone [facility]) 50 MCG/ACT nasal spray USE 2 SPRAYS INTO EACH NOSTRIL ONCE DAILY AT BEDTIME 08/19/21  Yes Loman Brooklyn, FNP  furosemide (LASIX) 20 MG tablet Take 1 tablet (20 mg total) by mouth daily. (NEEDS TO BE SEEN BEFORE NEXT REFILL) 05/06/22  Yes Pam Perking, FNP  lamoTRIgine (LAMICTAL) 100 MG tablet One qam, 2qhs Patient taking differently: 1-2 tablets 2 (two) times daily. Take 1 tablet in the morning and 2 tablets at bedtime 11/28/21  Yes Marcial Pacas, MD  levothyroxine (SYNTHROID) 150 MCG tablet Take 1 tablet (150 mcg total) by mouth daily before breakfast. 10/01/21  Yes Loman Brooklyn, FNP  loratadine (CLARITIN) 10 MG tablet Take 1 tablet (10 mg total) by mouth daily. 09/06/19  Yes Loman Brooklyn, FNP  losartan (COZAAR) 25 MG tablet Take 1 tablet (25 mg total) by mouth daily. (NEEDS TO BE SEEN BEFORE NEXT REFILL) 05/06/22  Yes Pam Perking, FNP  Melatonin 5 MG CAPS Take 5 mg by mouth at bedtime.   Yes [provider]  Multiple Vitamin (MULTIVITAMIN WITH MINERALS) TABS tablet Take 1 tablet by mouth daily.   Yes [provider]  Olopatadine HCl 0.2 % SOLN Apply 1 drop to eye daily. 12/23/18  Yes Loman Brooklyn, FNP  Omega-3 Fatty Acids (FISH OIL) 1000 MG CAPS Take 1 capsule by mouth daily.    Yes [provider]  omeprazole (PRILOSEC) 20 MG capsule TAKE 1 CAPSULE 2 TIMES A DAY BEFORE A MEAL 12/18/21  Yes Hendricks Limes F, FNP  potassium chloride SA (KLOR-CON M) 20 MEQ tablet Take 1 tablet (20 mEq total) by mouth 2 (two) times daily. (NEEDS TO BE SEEN BEFORE NEXT  REFILL) 05/06/22  Yes Pam Perking, FNP  sertraline (ZOLOFT) 50 MG tablet Take 1 tablet (50 mg total) by mouth daily. (NEEDS TO BE SEEN BEFORE NEXT REFILL) 05/06/22  Yes Pam Perking, FNP  spironolactone (ALDACTONE) 25 MG tablet Take 1 tablet (25 mg total) by mouth daily. (NEEDS TO BE SEEN BEFORE NEXT REFILL) 05/06/22  Yes Pam Perking, FNP  thioridazine (MELLARIL) 50 MG tablet Take 1 tablet (50 mg total) by mouth at bedtime. (NEEDS TO BE SEEN BEFORE NEXT REFILL) 05/06/22  Yes Pam Perking, FNP  Clotrimazole 1 % OINT Apply 1 application topically 2 (two) times daily. Patient not taking: Reported on 05/19/2022 01/12/19   Loman Brooklyn, FNP  LINZESS 72 MCG capsule TAKE 1 CAPSULE DAILY BEFORE BREAKFAST Patient not taking: Reported on 05/19/2022 02/27/20   Mahala Menghini, PA-C   No Known Allergies Review of Systems  Unable to perform ROS: Psychiatric disorder    Physical Exam Vitals and nursing note reviewed.  Constitutional:      General: She is not in acute distress.    Appearance: She is obese. She is ill-appearing.  HENT:     Head: Normocephalic and atraumatic.     Mouth/Throat:     Mouth: Mucous membranes are moist.  Cardiovascular:     Rate and Rhythm: Normal rate.  Pulmonary:     Effort: Pulmonary effort is normal. No  respiratory distress.  Skin:    General: Skin is warm and dry.  Neurological:     Mental Status: She is alert and oriented to person, place, and time.  Psychiatric:        Mood and Affect: Mood normal.        Behavior: Behavior normal.     Vital Signs: BP 114/71 (BP Location: Right Arm)   Pulse 62   Temp 98.1 F (36.7 C) (Oral)   Resp 16   Ht 4' 8"$  (1.422 m)   Wt 99.9 kg   SpO2 100%   BMI 49.38 kg/m  Pain Scale: 0-10 POSS *See Group Information*: 2-Acceptable,Slightly drowsy, easily aroused Pain Score: Asleep   SpO2: SpO2: 100 % O2 Device:SpO2: 100 % O2 Flow Rate: .   IO: Intake/output summary:  Intake/Output Summary (Last 24  hours) at 05/26/2022 0959 Last data filed at 05/25/2022 1802 Gross per 24 hour  Intake 0 ml  Output --  Net 0 ml    LBM: Last BM Date : 05/25/22 Baseline Weight: Weight: 82.5 kg Most recent weight: Weight: 99.9 kg     Palliative Assessment/Data:     Time In: 0910 Time Out: 1025 Time Total: 75 minutes  Greater than 50%  of this time was spent counseling and coordinating care related to the above assessment and plan.  Signed by: Drue Novel, NP   Please contact Palliative Medicine Team phone at [phone] for questions and concerns.  For individual provider: See Shea Evans

## 2022-05-26 NOTE — TOC Progression Note (Signed)
Transition of Care [facility]) - Progression Note    Patient Details  Name: Pam Sanders MRN: [id number] Date of Birth: [birth year]  Transition of Care [facility]) CM/SW Contact  Ihor Gully, LCSW Phone Number: 05/26/2022, 12:23 PM  Clinical Narrative:    Per Palliative care NP referral sent to [facility]. Marchia Meiers confirms receipt of referral and advises hospice nurse will assess patient at 1:30 today for appropriateness.    Expected Discharge Plan: [place] Barriers to Discharge: Continued Medical Work up  Expected Discharge Plan and Services In-house Referral: Clinical Social Work     Living arrangements for the past 2 months: Apartment                                       Social Determinants of Health (SDOH) Interventions SDOH Screenings   Food Insecurity: No Food Insecurity (03/04/2021)  Housing: Low Risk  (03/04/2021)  Transportation Needs: No Transportation Needs (03/04/2021)  Alcohol Screen: Low Risk  (03/04/2021)  Depression (PHQ2-9): Low Risk  (04/03/2022)  Financial Resource Strain: Low Risk  (03/04/2021)  Physical Activity: Inactive (03/04/2021)  Social Connections: Socially Isolated (03/04/2021)  Stress: No Stress Concern Present (03/04/2021)  Tobacco Use: Low Risk  (05/26/2022)    Readmission Risk Interventions     No data to display

## 2022-05-26 NOTE — Telephone Encounter (Signed)
Thank you for the update. I hate to hear this.

## 2022-05-26 NOTE — Progress Notes (Signed)
PROGRESS NOTE   MADDEX FICHTNER  [id number] DOB: [birth year] DOA: 05/19/2022 PCP: [name], FNP   Chief Complaint  Patient presents with   Fall   Level of care: Med-Surg  Brief Admission History:  72 y.o. female with past medical history relevant for history of seizures and intellectual disability in the setting of cerebral ventriculomegaly, GERD, hypothyroidism, schizophrenia and HTN presented to the ED with falls and weakness/lethargy and altered mentation.  Pt was found to have new atrial fibrillation with RVR.  Pt was treated for a severe hyponatremia with a sodium down to 114.  Cardiology consulted 2/14 for persistent afib RVR.  Pt noted to have rising LFTs that is being investigated.    Assessment and Plan:  Symptomatic Severe Hyponatremia--secondary to end stage heart failure  -awaiting residential hospice evaluation -Sodium fluctuating  -completed 3% saline solution -stopped normal saline  -waiting on palliative care now -waiting for residential hospice placement  -add IV lorazepam in case she starts seizing from low sodium    Newly discovered Cardiomyopathy -discussed with cardiology  -pt has biventricular heart failure with LVEF <20%  -discussed with cardiology, recommendation is to pursue palliative medicine -I discussed with patient's family and they are agreeable to palliative care at SNF, they are not able to care for patient at home; will ask [facility] for SNF placement with palliative care;  -consulted palliative care for goals of care discussions with family  -working on appropriate disposition, referral requested for residential hospice placement  -waiting for residential hospice evaluation  -anticipating less than 2 weeks prognosis as heart and liver continue to fail   Elevated LFTs / Transaminitis  -LFTs continues to rise; thought secondary to hepatic congestion from right heart failure -right upper quadrant ultrasound -->findings of hepatic steatosis,  equivocal findings for acute cholecystitis; cholelithiasis and wall thickening -increasing symptoms and scleral icterus seen -transitioned to full comfort measures    New onset A-fib with RVR----IV metoprolol alone was ineffective at controlling HR -cardiology consulted, pt was treated with IV amiodarone infusion, now transitioned to oral  -EKG is consistent with A-fib with RVR -echo reveals severe cardiomyopathy EF<20% -Elevated TSH noted, normal Free T4 reassuring -DC oral amiodarone due to advancing liver failure and progressive malaise and loss of appetite    Recurrent Falls/Ambulatory Dysfunction and weakness--- due to severe hyponatremia -CT head without acute findings but shows 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 ventriculomegaly) -CT C-spine without acute findings -now we are working on arranging residential hospice placement    Possible right greater trochanter fracture--- Hip and pelvic x-rays with possible nondisplaced fracture of the greater trochanter on the right -continue conservative management -comfort care   Obstipation - GI started linzess - RN disempacted at bedside on 2/17 - had moderate BM last night - ordered for milk of molasses enema today  - transitioned to full comfort care treatments   DNR  - reviewed ACP documents that patient has in EMR - if terminally ill patient did not want lift prolonged by lift sustaining procedures - pt is now terminally ill and I have spoken with family about hospice care and transition to full comfort care    Hypothyroidism----TSH is 10.2 -Free T4 at 1.07   Hyperkalemia -added lokelma x 3 doses -treated and resolved    CKD 3A with hyperkalemia---  -Creatinine trending down with hydration  -Potassium normalized after Lokelma - renally adjusted medications, avoid nephrotoxic agents / dehydration  / hypotension - transitioned to  full comfort measures     Epilepsy/history of seizures-- history of seizures and intellectual disability in the setting of cerebral ventriculomegaly- -no recent seizures -Not on antiepileptic agents PTA -Monitor closely especially with severe hyponatremia and risk for seizures -transitioned to full comfort measures 2/19   UTI--- treated with ceftriaxone  Candidal Intertrigo - treated with nizoral creme topical treatment   DVT prophylaxis: SCDs--transitioned to full comfort measures 2/19 Code Status: DNR Family Communication: discussion with sister/brother in law at bedside 2/19  Disposition: anticipating residential hospice  Remains inpatient appropriate because: intensity of illness   Consultants:  Cardiology  Procedures:   Antimicrobials:    Subjective: Pt says she feels sick; she says she hurts all over body;   Objective: Vitals:   05/25/22 2000 05/25/22 2109 05/26/22 0438 05/26/22 0938  BP: 117/65 114/71 106/74 114/71  Pulse:  (!) 57 (!) 59 62  Resp: 13 15 16 16  $ Temp:  98 F (36.7 C) 97.7 F (36.5 C) 98.1 F (36.7 C)  TempSrc:  Axillary Axillary Oral  SpO2:  100% 96% 100%  Weight:  99.9 kg    Height:        Intake/Output Summary (Last 24 hours) at 05/26/2022 1301 Last data filed at 05/26/2022 0900 Gross per 24 hour  Intake 0 ml  Output --  Net 0 ml   Filed Weights   05/23/22 0500 05/25/22 0438 05/25/22 2109  Weight: 102.3 kg 99.2 kg 99.9 kg   Examination:  General exam: awake, alert, cooperative, NAD, appears pale with scleral icterus seen.  Respiratory system: bibasilar crackles. Shallow breathing. Cardiovascular system: irregularly irregular and tachycardic, normal S1 & S2 heard. No JVD, murmurs, rubs, gallops or clicks. 1+ pedal edema. Gastrointestinal system: Abdomen is distended, soft and diffusely tender. No organomegaly or masses felt. Normal bowel sounds heard. Central nervous system: Alert and oriented. No focal neurological deficits. Extremities: 1+ edema BLEs.  Symmetric 5 x 5 power. Skin: No rashes, lesions or ulcers. Psychiatry: Mood & affect flat.    Data Reviewed: I have personally reviewed following labs and imaging studies  CBC: Recent Labs  Lab 05/21/22 0434 05/22/22 0452  WBC 10.0 9.9  HGB 11.6* 12.1  HCT 35.8* 36.4  MCV 101.4* 99.2  PLT 190 123456    Basic Metabolic Panel: Recent Labs  Lab 05/20/22 1517 05/21/22 0434 05/21/22 2221 05/22/22 0452 05/23/22 0445 05/24/22 0407  NA 128* 126* 127* 127* 126* 123*  K 2.9* 5.3* 4.6 4.9 5.3* 5.2*  CL 107 100 98 99 97* 95*  CO2 15* 18* 19* 19* 18* 18*  GLUCOSE 76 78 109* 125* 162* 100*  BUN 19 21 23 $ 25* 28* 40*  CREATININE 0.68 1.00 1.12* 1.12* 1.47* 1.80*  CALCIUM 5.9* 8.2* 8.3* 8.1* 8.5* 8.5*  MG  --   --  2.1 2.1  --   --   PHOS 2.7  --   --   --   --   --     CBG: No results for input(s): "GLUCAP" in the last 168 hours.   Recent Results (from the past 240 hour(s))  MRSA Next Gen by PCR, Nasal     Status: None   Collection Time: 05/19/22  2:00 PM   Specimen: Nasal Mucosa; Nasal Swab  Result Value Ref Range Status   MRSA by PCR Next Gen NOT DETECTED NOT DETECTED Final    Comment: (NOTE) The GeneXpert MRSA Assay (FDA approved for NASAL specimens only), is one component of a comprehensive MRSA colonization surveillance  program. It is not intended to diagnose MRSA infection nor to guide or monitor treatment for MRSA infections. Test performance is not FDA approved in patients less than 67 years old. Performed at [facility], [address] [place], [place] [address]   Resp panel by RT-PCR (RSV, Flu A&B, Covid) Anterior Nasal Swab     Status: None   Collection Time: 05/19/22  2:10 PM   Specimen: Anterior Nasal Swab  Result Value Ref Range Status   SARS Coronavirus 2 by RT PCR NEGATIVE NEGATIVE Final    Comment: (NOTE) SARS-CoV-2 target nucleic acids are NOT DETECTED.  The SARS-CoV-2 RNA is generally detectable in upper respiratory specimens during the acute phase  of infection. The lowest concentration of SARS-CoV-2 viral copies this assay can detect is 138 copies/mL. A negative result does not preclude SARS-Cov-2 infection and should not be used as the sole basis for treatment or other patient management decisions. A negative result may occur with  improper specimen collection/handling, submission of specimen other than nasopharyngeal swab, presence of viral mutation(s) within the areas targeted by this assay, and inadequate number of viral copies(<138 copies/mL). A negative result must be combined with clinical observations, patient history, and epidemiological information. The expected result is Negative.  Fact Sheet for Patients:  EntrepreneurPulse.com.au  Fact Sheet for Healthcare Providers:  IncredibleEmployment.be  This test is no t yet approved or cleared by the [place] FDA and  has been authorized for detection and/or diagnosis of SARS-CoV-2 by FDA under an Emergency Use Authorization (EUA). This EUA will remain  in effect (meaning this test can be used) for the duration of the COVID-19 declaration under Section 564(b)(1) of the Act, 21 U.S.C.section 360bbb-3(b)(1), unless the authorization is terminated  or revoked sooner.       Influenza A by PCR NEGATIVE NEGATIVE Final   Influenza B by PCR NEGATIVE NEGATIVE Final    Comment: (NOTE) The Xpert Xpress SARS-CoV-2/FLU/RSV plus assay is intended as an aid in the diagnosis of influenza from Nasopharyngeal swab specimens and should not be used as a sole basis for treatment. Nasal washings and aspirates are unacceptable for Xpert Xpress SARS-CoV-2/FLU/RSV testing.  Fact Sheet for Patients: EntrepreneurPulse.com.au  Fact Sheet for Healthcare Providers: IncredibleEmployment.be  This test is not yet approved or cleared by the [place] FDA and has been authorized for detection and/or diagnosis of  SARS-CoV-2 by FDA under an Emergency Use Authorization (EUA). This EUA will remain in effect (meaning this test can be used) for the duration of the COVID-19 declaration under Section 564(b)(1) of the Act, 21 U.S.C. section 360bbb-3(b)(1), unless the authorization is terminated or revoked.     Resp Syncytial Virus by PCR NEGATIVE NEGATIVE Final    Comment: (NOTE) Fact Sheet for Patients: EntrepreneurPulse.com.au  Fact Sheet for Healthcare Providers: IncredibleEmployment.be  This test is not yet approved or cleared by the [place] FDA and has been authorized for detection and/or diagnosis of SARS-CoV-2 by FDA under an Emergency Use Authorization (EUA). This EUA will remain in effect (meaning this test can be used) for the duration of the COVID-19 declaration under Section 564(b)(1) of the Act, 21 U.S.C. section 360bbb-3(b)(1), unless the authorization is terminated or revoked.  Performed at [facility], [address], [place], [place] [address]   Urine Culture     Status: None   Collection Time: 05/19/22  4:35 PM   Specimen: Urine, Clean Catch  Result Value Ref Range Status   Specimen Description   Final  URINE, CLEAN CATCH Performed at [facility], [address], [place], [place] [address]    Special Requests   Final    NONE Performed at [facility], [address] [place], [place] [address]    Culture   Final    NO GROWTH Performed at [facility], [place] [address] [place], [place] [address]    Report Status 05/21/2022 FINAL  Final     Radiology Studies: No results found.  Scheduled Meds:  ketoconazole   Topical BID   linaclotide  145 mcg Oral QAC breakfast   Continuous Infusions:   LOS: 7 days   Time Spent: 36 mins  Kyandre Okray, MD How to contact the [facility] Attending or Consulting provider [place] or covering provider during after hours [place], for this patient?  Check the care team in [facility] and look for  a) attending/consulting TRH provider listed and b) the [facility] team listed Log into [email or web address] and use [place]'s universal password to access. If you do not have the password, please contact the hospital operator. Locate the [facility] provider you are looking for under [facility] and page to a number that you can be directly reached. If you still have difficulty reaching the provider, please page the [facility] (Director on Call) for the Hospitalists listed on amion for assistance.  05/26/2022, 1:01 PM

## 2022-05-26 NOTE — TOC Transition Note (Signed)
Transition of Care [facility]) - CM/SW Discharge Note   Patient Details  Name: Pam Sanders MRN: [id number] Date of Birth: 09/14/1950  [facility] CM/SW Contact:  Ihor Gully, LCSW Phone Number: 05/26/2022, 3:09 PM   Clinical Narrative:    D/c clinicials sent to [facility]. [facility] contacted for transfer. Nurse to call report. Sister, Mrs. Glynda Jaeger, notified.    Final next level of care: [place] Barriers to Discharge: No Barriers Identified   Patient Goals and CMS Choice      Discharge Placement                Patient chooses bed at: Other - please specify in the comment section below: [facility]) Patient to be transferred to facility by: [facility] Name of family member notified: sister, Mrs. Glynda Jaeger Patient and family notified of of transfer: 05/26/22  Discharge Plan and Services Additional resources added to the After Visit Summary for   In-house Referral: Clinical Social Work                                   Social Determinants of Health ([place]) Interventions SDOH Screenings   Food Insecurity: No Food Insecurity (03/04/2021)  Housing: Low Risk  (03/04/2021)  Transportation Needs: No Transportation Needs (03/04/2021)  Alcohol Screen: Low Risk  (03/04/2021)  Depression (PHQ2-9): Low Risk  (04/03/2022)  Financial Resource Strain: Low Risk  (03/04/2021)  Physical Activity: Inactive (03/04/2021)  Social Connections: Socially Isolated (03/04/2021)  Stress: No Stress Concern Present (03/04/2021)  Tobacco Use: Low Risk  (05/26/2022)     Readmission Risk Interventions     No data to display

## 2022-06-09 DIAGNOSIS — J309 Allergic rhinitis, unspecified: Secondary | ICD-10-CM | POA: Diagnosis not present

## 2022-06-09 DIAGNOSIS — E559 Vitamin D deficiency, unspecified: Secondary | ICD-10-CM | POA: Diagnosis not present

## 2022-06-09 DIAGNOSIS — N319 Neuromuscular dysfunction of bladder, unspecified: Secondary | ICD-10-CM | POA: Diagnosis not present

## 2022-06-09 DIAGNOSIS — N189 Chronic kidney disease, unspecified: Secondary | ICD-10-CM | POA: Diagnosis not present

## 2022-06-09 DIAGNOSIS — G4709 Other insomnia: Secondary | ICD-10-CM | POA: Diagnosis not present

## 2022-06-09 DIAGNOSIS — E782 Mixed hyperlipidemia: Secondary | ICD-10-CM | POA: Diagnosis not present

## 2022-06-09 DIAGNOSIS — I10 Essential (primary) hypertension: Secondary | ICD-10-CM | POA: Diagnosis not present

## 2022-06-09 DIAGNOSIS — N139 Obstructive and reflux uropathy, unspecified: Secondary | ICD-10-CM | POA: Diagnosis not present

## 2022-06-09 DIAGNOSIS — K5900 Constipation, unspecified: Secondary | ICD-10-CM | POA: Diagnosis not present

## 2022-06-09 DIAGNOSIS — R2689 Other abnormalities of gait and mobility: Secondary | ICD-10-CM | POA: Diagnosis not present

## 2022-06-09 DIAGNOSIS — I119 Hypertensive heart disease without heart failure: Secondary | ICD-10-CM | POA: Diagnosis not present

## 2022-06-09 DIAGNOSIS — Z960 Presence of urogenital implants: Secondary | ICD-10-CM | POA: Diagnosis not present

## 2022-06-09 DIAGNOSIS — I2510 Atherosclerotic heart disease of native coronary artery without angina pectoris: Secondary | ICD-10-CM | POA: Diagnosis not present

## 2022-06-09 DIAGNOSIS — K432 Incisional hernia without obstruction or gangrene: Secondary | ICD-10-CM | POA: Diagnosis not present

## 2022-06-09 DIAGNOSIS — E119 Type 2 diabetes mellitus without complications: Secondary | ICD-10-CM | POA: Diagnosis not present

## 2022-06-09 DIAGNOSIS — E038 Other specified hypothyroidism: Secondary | ICD-10-CM | POA: Diagnosis not present

## 2022-06-09 DIAGNOSIS — E785 Hyperlipidemia, unspecified: Secondary | ICD-10-CM | POA: Diagnosis not present

## 2022-06-09 DIAGNOSIS — E039 Hypothyroidism, unspecified: Secondary | ICD-10-CM | POA: Diagnosis not present

## 2022-06-09 DIAGNOSIS — B342 Coronavirus infection, unspecified: Secondary | ICD-10-CM | POA: Diagnosis not present

## 2022-06-09 DIAGNOSIS — M6281 Muscle weakness (generalized): Secondary | ICD-10-CM | POA: Diagnosis not present

## 2022-06-09 DIAGNOSIS — R278 Other lack of coordination: Secondary | ICD-10-CM | POA: Diagnosis not present

## 2022-06-16 DIAGNOSIS — M6281 Muscle weakness (generalized): Secondary | ICD-10-CM | POA: Diagnosis not present

## 2022-06-16 DIAGNOSIS — E038 Other specified hypothyroidism: Secondary | ICD-10-CM | POA: Diagnosis not present

## 2022-06-16 DIAGNOSIS — E039 Hypothyroidism, unspecified: Secondary | ICD-10-CM | POA: Diagnosis not present

## 2022-06-16 DIAGNOSIS — G4709 Other insomnia: Secondary | ICD-10-CM | POA: Diagnosis not present

## 2022-06-16 DIAGNOSIS — K5900 Constipation, unspecified: Secondary | ICD-10-CM | POA: Diagnosis not present

## 2022-06-16 DIAGNOSIS — B342 Coronavirus infection, unspecified: Secondary | ICD-10-CM | POA: Diagnosis not present

## 2022-06-16 DIAGNOSIS — N139 Obstructive and reflux uropathy, unspecified: Secondary | ICD-10-CM | POA: Diagnosis not present

## 2022-06-20 DIAGNOSIS — E119 Type 2 diabetes mellitus without complications: Secondary | ICD-10-CM | POA: Diagnosis not present

## 2022-06-20 DIAGNOSIS — I10 Essential (primary) hypertension: Secondary | ICD-10-CM | POA: Diagnosis not present

## 2022-06-27 DIAGNOSIS — E785 Hyperlipidemia, unspecified: Secondary | ICD-10-CM | POA: Diagnosis not present

## 2022-06-27 DIAGNOSIS — I2510 Atherosclerotic heart disease of native coronary artery without angina pectoris: Secondary | ICD-10-CM | POA: Diagnosis not present

## 2022-06-27 DIAGNOSIS — J90 Pleural effusion, not elsewhere classified: Secondary | ICD-10-CM | POA: Diagnosis not present

## 2022-06-27 DIAGNOSIS — I081 Rheumatic disorders of both mitral and tricuspid valves: Secondary | ICD-10-CM | POA: Diagnosis not present

## 2022-06-27 DIAGNOSIS — R079 Chest pain, unspecified: Secondary | ICD-10-CM | POA: Diagnosis not present

## 2022-06-27 DIAGNOSIS — I5023 Acute on chronic systolic (congestive) heart failure: Secondary | ICD-10-CM | POA: Diagnosis not present

## 2022-06-27 DIAGNOSIS — I130 Hypertensive heart and chronic kidney disease with heart failure and stage 1 through stage 4 chronic kidney disease, or unspecified chronic kidney disease: Secondary | ICD-10-CM | POA: Diagnosis not present

## 2022-06-27 DIAGNOSIS — D696 Thrombocytopenia, unspecified: Secondary | ICD-10-CM | POA: Diagnosis not present

## 2022-06-27 DIAGNOSIS — I4891 Unspecified atrial fibrillation: Secondary | ICD-10-CM | POA: Diagnosis not present

## 2022-06-27 DIAGNOSIS — I499 Cardiac arrhythmia, unspecified: Secondary | ICD-10-CM | POA: Diagnosis not present

## 2022-06-27 DIAGNOSIS — G9389 Other specified disorders of brain: Secondary | ICD-10-CM | POA: Diagnosis not present

## 2022-06-27 DIAGNOSIS — R799 Abnormal finding of blood chemistry, unspecified: Secondary | ICD-10-CM | POA: Diagnosis not present

## 2022-06-27 DIAGNOSIS — D539 Nutritional anemia, unspecified: Secondary | ICD-10-CM | POA: Diagnosis not present

## 2022-06-27 DIAGNOSIS — I480 Paroxysmal atrial fibrillation: Secondary | ICD-10-CM | POA: Diagnosis not present

## 2022-06-27 DIAGNOSIS — I517 Cardiomegaly: Secondary | ICD-10-CM | POA: Diagnosis not present

## 2022-06-27 DIAGNOSIS — J9811 Atelectasis: Secondary | ICD-10-CM | POA: Diagnosis not present

## 2022-06-27 DIAGNOSIS — I739 Peripheral vascular disease, unspecified: Secondary | ICD-10-CM | POA: Diagnosis not present

## 2022-06-27 DIAGNOSIS — R0600 Dyspnea, unspecified: Secondary | ICD-10-CM | POA: Diagnosis not present

## 2022-06-27 DIAGNOSIS — N183 Chronic kidney disease, stage 3 unspecified: Secondary | ICD-10-CM | POA: Diagnosis not present

## 2022-06-27 DIAGNOSIS — E872 Acidosis, unspecified: Secondary | ICD-10-CM | POA: Diagnosis not present

## 2022-06-27 DIAGNOSIS — E039 Hypothyroidism, unspecified: Secondary | ICD-10-CM | POA: Diagnosis not present

## 2022-06-27 DIAGNOSIS — R918 Other nonspecific abnormal finding of lung field: Secondary | ICD-10-CM | POA: Diagnosis not present

## 2022-06-27 DIAGNOSIS — K219 Gastro-esophageal reflux disease without esophagitis: Secondary | ICD-10-CM | POA: Diagnosis not present

## 2022-06-27 DIAGNOSIS — Z7409 Other reduced mobility: Secondary | ICD-10-CM | POA: Diagnosis not present

## 2022-06-27 DIAGNOSIS — Z66 Do not resuscitate: Secondary | ICD-10-CM | POA: Diagnosis not present

## 2022-06-27 DIAGNOSIS — I5189 Other ill-defined heart diseases: Secondary | ICD-10-CM | POA: Diagnosis not present

## 2022-06-27 DIAGNOSIS — Z515 Encounter for palliative care: Secondary | ICD-10-CM | POA: Diagnosis not present

## 2022-06-27 DIAGNOSIS — E871 Hypo-osmolality and hyponatremia: Secondary | ICD-10-CM | POA: Diagnosis not present

## 2022-06-27 DIAGNOSIS — R52 Pain, unspecified: Secondary | ICD-10-CM | POA: Diagnosis not present

## 2022-06-27 DIAGNOSIS — Z7189 Other specified counseling: Secondary | ICD-10-CM | POA: Diagnosis not present

## 2022-06-27 DIAGNOSIS — I5084 End stage heart failure: Secondary | ICD-10-CM | POA: Diagnosis not present

## 2022-07-07 DEATH — deceased

## 2022-11-25 ENCOUNTER — Ambulatory Visit: Payer: Medicare Other | Admitting: Adult Health
